# Patient Record
Sex: Female | Born: 1960 | Race: White | Hispanic: No | Marital: Single | Smoking: Former smoker
Health system: Southern US, Community
[De-identification: ages and names within clinical notes are randomized; demographics above are authoritative.]

## PROBLEM LIST (undated history)

## (undated) ENCOUNTER — Emergency Department: Admission: EM | Payer: Medicare HMO | Source: Home / Self Care

## (undated) DIAGNOSIS — M1990 Unspecified osteoarthritis, unspecified site: Secondary | ICD-10-CM

## (undated) DIAGNOSIS — M7072 Other bursitis of hip, left hip: Secondary | ICD-10-CM

## (undated) DIAGNOSIS — F4310 Post-traumatic stress disorder, unspecified: Secondary | ICD-10-CM

## (undated) DIAGNOSIS — E785 Hyperlipidemia, unspecified: Secondary | ICD-10-CM

## (undated) DIAGNOSIS — B009 Herpesviral infection, unspecified: Secondary | ICD-10-CM

## (undated) DIAGNOSIS — I959 Hypotension, unspecified: Secondary | ICD-10-CM

## (undated) DIAGNOSIS — Z9289 Personal history of other medical treatment: Secondary | ICD-10-CM

## (undated) DIAGNOSIS — I219 Acute myocardial infarction, unspecified: Secondary | ICD-10-CM

## (undated) DIAGNOSIS — J45909 Unspecified asthma, uncomplicated: Secondary | ICD-10-CM

## (undated) DIAGNOSIS — I2510 Atherosclerotic heart disease of native coronary artery without angina pectoris: Secondary | ICD-10-CM

## (undated) DIAGNOSIS — J189 Pneumonia, unspecified organism: Secondary | ICD-10-CM

## (undated) DIAGNOSIS — Z95810 Presence of automatic (implantable) cardiac defibrillator: Secondary | ICD-10-CM

## (undated) DIAGNOSIS — K219 Gastro-esophageal reflux disease without esophagitis: Secondary | ICD-10-CM

## (undated) DIAGNOSIS — M797 Fibromyalgia: Secondary | ICD-10-CM

## (undated) DIAGNOSIS — R519 Headache, unspecified: Secondary | ICD-10-CM

## (undated) DIAGNOSIS — Z95818 Presence of other cardiac implants and grafts: Secondary | ICD-10-CM

## (undated) DIAGNOSIS — F329 Major depressive disorder, single episode, unspecified: Secondary | ICD-10-CM

## (undated) DIAGNOSIS — F32A Depression, unspecified: Secondary | ICD-10-CM

## (undated) DIAGNOSIS — I255 Ischemic cardiomyopathy: Secondary | ICD-10-CM

## (undated) DIAGNOSIS — I82409 Acute embolism and thrombosis of unspecified deep veins of unspecified lower extremity: Secondary | ICD-10-CM

## (undated) DIAGNOSIS — D6851 Activated protein C resistance: Secondary | ICD-10-CM

## (undated) DIAGNOSIS — O2230 Deep phlebothrombosis in pregnancy, unspecified trimester: Secondary | ICD-10-CM

## (undated) DIAGNOSIS — I639 Cerebral infarction, unspecified: Secondary | ICD-10-CM

## (undated) DIAGNOSIS — J449 Chronic obstructive pulmonary disease, unspecified: Secondary | ICD-10-CM

## (undated) DIAGNOSIS — H409 Unspecified glaucoma: Secondary | ICD-10-CM

## (undated) DIAGNOSIS — M069 Rheumatoid arthritis, unspecified: Secondary | ICD-10-CM

## (undated) HISTORY — PX: MASTECTOMY: SHX3

## (undated) HISTORY — DX: Other bursitis of hip, left hip: M70.72

## (undated) HISTORY — DX: Activated protein C resistance: D68.51

## (undated) HISTORY — PX: FOOT SURGERY: SHX648

## (undated) HISTORY — PX: COLONOSCOPY: SHX174

## (undated) HISTORY — DX: Unspecified glaucoma: H40.9

## (undated) HISTORY — PX: APPENDECTOMY: SHX54

## (undated) HISTORY — PX: SHOULDER SURGERY: SHX246

## (undated) HISTORY — PX: CARDIAC CATHETERIZATION: SHX172

## (undated) HISTORY — PX: VEIN BYPASS SURGERY: SHX833

## (undated) HISTORY — PX: ABDOMINAL HYSTERECTOMY: SHX81

---

## 2011-03-21 DIAGNOSIS — Z8673 Personal history of transient ischemic attack (TIA), and cerebral infarction without residual deficits: Secondary | ICD-10-CM | POA: Insufficient documentation

## 2011-03-21 DIAGNOSIS — E7800 Pure hypercholesterolemia, unspecified: Secondary | ICD-10-CM | POA: Insufficient documentation

## 2011-03-21 DIAGNOSIS — M797 Fibromyalgia: Secondary | ICD-10-CM | POA: Insufficient documentation

## 2011-03-21 DIAGNOSIS — J439 Emphysema, unspecified: Secondary | ICD-10-CM | POA: Insufficient documentation

## 2011-03-21 DIAGNOSIS — D6851 Activated protein C resistance: Secondary | ICD-10-CM | POA: Insufficient documentation

## 2012-02-02 DIAGNOSIS — Z86718 Personal history of other venous thrombosis and embolism: Secondary | ICD-10-CM | POA: Insufficient documentation

## 2012-04-05 DIAGNOSIS — M1990 Unspecified osteoarthritis, unspecified site: Secondary | ICD-10-CM | POA: Insufficient documentation

## 2013-08-29 DIAGNOSIS — F331 Major depressive disorder, recurrent, moderate: Secondary | ICD-10-CM | POA: Insufficient documentation

## 2013-10-13 DIAGNOSIS — G5620 Lesion of ulnar nerve, unspecified upper limb: Secondary | ICD-10-CM | POA: Insufficient documentation

## 2014-01-14 DIAGNOSIS — M4317 Spondylolisthesis, lumbosacral region: Secondary | ICD-10-CM | POA: Insufficient documentation

## 2014-02-12 DIAGNOSIS — S52601A Unspecified fracture of lower end of right ulna, initial encounter for closed fracture: Secondary | ICD-10-CM | POA: Insufficient documentation

## 2014-03-09 DIAGNOSIS — Z880 Allergy status to penicillin: Secondary | ICD-10-CM | POA: Insufficient documentation

## 2014-03-09 DIAGNOSIS — Z886 Allergy status to analgesic agent status: Secondary | ICD-10-CM | POA: Insufficient documentation

## 2016-08-25 DIAGNOSIS — I2510 Atherosclerotic heart disease of native coronary artery without angina pectoris: Secondary | ICD-10-CM | POA: Insufficient documentation

## 2016-08-31 DIAGNOSIS — K439 Ventral hernia without obstruction or gangrene: Secondary | ICD-10-CM | POA: Insufficient documentation

## 2017-02-08 DIAGNOSIS — M205X2 Other deformities of toe(s) (acquired), left foot: Secondary | ICD-10-CM | POA: Insufficient documentation

## 2017-03-05 DIAGNOSIS — H8112 Benign paroxysmal vertigo, left ear: Secondary | ICD-10-CM | POA: Insufficient documentation

## 2017-06-06 DIAGNOSIS — I739 Peripheral vascular disease, unspecified: Secondary | ICD-10-CM | POA: Insufficient documentation

## 2017-06-13 ENCOUNTER — Other Ambulatory Visit: Payer: Self-pay | Admitting: "Endocrinology

## 2017-06-13 DIAGNOSIS — I739 Peripheral vascular disease, unspecified: Secondary | ICD-10-CM

## 2017-06-13 DIAGNOSIS — R600 Localized edema: Secondary | ICD-10-CM

## 2017-06-19 ENCOUNTER — Ambulatory Visit
Admission: RE | Admit: 2017-06-19 | Discharge: 2017-06-19 | Disposition: A | Discharge status: Home / Self Care | Payer: Self-pay | Source: Ambulatory Visit | Attending: "Endocrinology | Admitting: "Endocrinology

## 2017-06-19 DIAGNOSIS — R600 Localized edema: Secondary | ICD-10-CM

## 2017-06-20 ENCOUNTER — Ambulatory Visit
Admission: RE | Admit: 2017-06-20 | Discharge: 2017-06-20 | Disposition: A | Discharge status: Home / Self Care | Payer: Medicare HMO | Source: Ambulatory Visit | Attending: "Endocrinology | Admitting: "Endocrinology

## 2017-06-20 DIAGNOSIS — I739 Peripheral vascular disease, unspecified: Secondary | ICD-10-CM

## 2017-06-28 ENCOUNTER — Other Ambulatory Visit: Payer: Self-pay | Admitting: Pain Medicine

## 2017-06-28 DIAGNOSIS — M8580 Other specified disorders of bone density and structure, unspecified site: Secondary | ICD-10-CM

## 2017-08-02 DIAGNOSIS — I999 Unspecified disorder of circulatory system: Secondary | ICD-10-CM | POA: Insufficient documentation

## 2017-08-02 DIAGNOSIS — I252 Old myocardial infarction: Secondary | ICD-10-CM | POA: Insufficient documentation

## 2017-08-02 DIAGNOSIS — M7062 Trochanteric bursitis, left hip: Secondary | ICD-10-CM | POA: Insufficient documentation

## 2017-08-02 DIAGNOSIS — E785 Hyperlipidemia, unspecified: Secondary | ICD-10-CM | POA: Insufficient documentation

## 2017-08-14 DIAGNOSIS — I82409 Acute embolism and thrombosis of unspecified deep veins of unspecified lower extremity: Secondary | ICD-10-CM | POA: Insufficient documentation

## 2017-08-21 ENCOUNTER — Ambulatory Visit
Admission: RE | Admit: 2017-08-21 | Discharge: 2017-08-21 | Disposition: A | Discharge status: Home / Self Care | Payer: Medicare HMO | Source: Ambulatory Visit | Attending: Pain Medicine | Admitting: Pain Medicine

## 2017-08-21 DIAGNOSIS — M8580 Other specified disorders of bone density and structure, unspecified site: Secondary | ICD-10-CM

## 2017-09-11 ENCOUNTER — Other Ambulatory Visit (HOSPITAL_COMMUNITY): Payer: Self-pay | Admitting: Physician Assistant

## 2017-09-11 ENCOUNTER — Ambulatory Visit (HOSPITAL_COMMUNITY)
Admission: RE | Admit: 2017-09-11 | Discharge: 2017-09-11 | Disposition: A | Discharge status: Home / Self Care | Payer: Medicare HMO | Source: Ambulatory Visit | Attending: Physician Assistant | Admitting: Physician Assistant

## 2017-09-11 DIAGNOSIS — M79662 Pain in left lower leg: Secondary | ICD-10-CM | POA: Diagnosis present

## 2017-09-11 DIAGNOSIS — M7989 Other specified soft tissue disorders: Secondary | ICD-10-CM | POA: Insufficient documentation

## 2017-09-11 DIAGNOSIS — R936 Abnormal findings on diagnostic imaging of limbs: Secondary | ICD-10-CM | POA: Insufficient documentation

## 2018-02-27 DIAGNOSIS — I639 Cerebral infarction, unspecified: Secondary | ICD-10-CM

## 2018-02-27 HISTORY — DX: Cerebral infarction, unspecified: I63.9

## 2018-03-13 DIAGNOSIS — Z955 Presence of coronary angioplasty implant and graft: Secondary | ICD-10-CM | POA: Insufficient documentation

## 2018-03-13 DIAGNOSIS — I2510 Atherosclerotic heart disease of native coronary artery without angina pectoris: Secondary | ICD-10-CM | POA: Insufficient documentation

## 2018-03-13 DIAGNOSIS — I255 Ischemic cardiomyopathy: Secondary | ICD-10-CM | POA: Insufficient documentation

## 2018-04-29 ENCOUNTER — Encounter (HOSPITAL_COMMUNITY): Payer: Self-pay | Admitting: Family Medicine

## 2018-04-29 ENCOUNTER — Emergency Department (HOSPITAL_COMMUNITY)
Admission: EM | Admit: 2018-04-29 | Discharge: 2018-04-30 | Disposition: A | Discharge status: Home / Self Care | Payer: Medicare HMO | Attending: Emergency Medicine | Admitting: Emergency Medicine

## 2018-04-29 ENCOUNTER — Emergency Department (HOSPITAL_COMMUNITY): Payer: Medicare HMO

## 2018-04-29 ENCOUNTER — Other Ambulatory Visit: Payer: Self-pay

## 2018-04-29 DIAGNOSIS — Z7901 Long term (current) use of anticoagulants: Secondary | ICD-10-CM | POA: Insufficient documentation

## 2018-04-29 DIAGNOSIS — R072 Precordial pain: Secondary | ICD-10-CM

## 2018-04-29 DIAGNOSIS — Z87891 Personal history of nicotine dependence: Secondary | ICD-10-CM | POA: Insufficient documentation

## 2018-04-29 DIAGNOSIS — J449 Chronic obstructive pulmonary disease, unspecified: Secondary | ICD-10-CM | POA: Diagnosis not present

## 2018-04-29 DIAGNOSIS — Z7982 Long term (current) use of aspirin: Secondary | ICD-10-CM | POA: Insufficient documentation

## 2018-04-29 DIAGNOSIS — Z79899 Other long term (current) drug therapy: Secondary | ICD-10-CM | POA: Diagnosis not present

## 2018-04-29 DIAGNOSIS — R079 Chest pain, unspecified: Secondary | ICD-10-CM | POA: Diagnosis present

## 2018-04-29 HISTORY — DX: Acute myocardial infarction, unspecified: I21.9

## 2018-04-29 HISTORY — DX: Major depressive disorder, single episode, unspecified: F32.9

## 2018-04-29 HISTORY — DX: Gastro-esophageal reflux disease without esophagitis: K21.9

## 2018-04-29 HISTORY — DX: Hyperlipidemia, unspecified: E78.5

## 2018-04-29 HISTORY — DX: Cerebral infarction, unspecified: I63.9

## 2018-04-29 HISTORY — DX: Deep phlebothrombosis in pregnancy, unspecified trimester: O22.30

## 2018-04-29 HISTORY — DX: Unspecified asthma, uncomplicated: J45.909

## 2018-04-29 HISTORY — DX: Chronic obstructive pulmonary disease, unspecified: J44.9

## 2018-04-29 HISTORY — DX: Herpesviral infection, unspecified: B00.9

## 2018-04-29 HISTORY — DX: Depression, unspecified: F32.A

## 2018-04-29 LAB — BASIC METABOLIC PANEL
Anion gap: 8 (ref 5–15)
BUN: 10 mg/dL (ref 6–20)
CO2: 25 mmol/L (ref 22–32)
Calcium: 8.7 mg/dL — ABNORMAL LOW (ref 8.9–10.3)
Chloride: 106 mmol/L (ref 98–111)
Creatinine, Ser: 0.84 mg/dL (ref 0.44–1.00)
GFR calc Af Amer: >60 mL/min (ref >60)
GFR calc non Af Amer: >60 mL/min (ref >60)
Glucose, Bld: 92 mg/dL (ref 70–99)
Potassium: 3.8 mmol/L (ref 3.5–5.1)
Sodium: 139 mmol/L (ref 135–145)

## 2018-04-29 LAB — CBC
HCT: 46.8 % — ABNORMAL HIGH (ref 36.0–46.0)
Hemoglobin: 14.7 g/dL (ref 12.0–15.0)
MCH: 31 pg (ref 26.0–34.0)
MCHC: 31.4 g/dL (ref 30.0–36.0)
MCV: 98.7 fL (ref 80.0–100.0)
Platelets: 276 10*3/uL (ref 150–400)
RBC: 4.74 MIL/uL (ref 3.87–5.11)
RDW: 14.2 % (ref 11.5–15.5)
WBC: 7.3 10*3/uL (ref 4.0–10.5)
nRBC: 0 % (ref 0.0–0.2)

## 2018-04-29 LAB — TROPONIN I
Troponin I: <0.03 ng/mL (ref <0.03)
Troponin I: <0.03 ng/mL (ref <0.03)

## 2018-04-29 LAB — I-STAT BETA HCG BLOOD, ED (NOT ORDERABLE): I-stat hCG, quantitative: <5 m[IU]/mL (ref <5)

## 2018-04-29 MED ORDER — SODIUM CHLORIDE 0.9% FLUSH: 3.0000 mL | Freq: Once | INTRAVENOUS | Status: DC

## 2018-04-29 MED ORDER — ASPIRIN 81 MG PO CHEW
324.0000 mg | CHEWABLE_TABLET | Freq: Once | ORAL | Status: AC
  Administered 2018-04-29: 324 mg via ORAL
  Filled 2018-04-29: qty 4

## 2018-04-29 NOTE — ED Triage Notes (Signed)
Patient is experiencing left sided chest pain that started about an hour ago. Patient states the pain started while she was walking. Patient describes pain as pressure with associated symptoms of shortness of breath and dizziness. Patients skin is warm and dry. Appears in no acute distress.

## 2018-04-29 NOTE — Discharge Instructions (Addendum)
Return if pain recurs.   Continue taking aspirin every day.

## 2018-04-29 NOTE — ED Provider Notes (Signed)
[facility]-EMERGENCY DEPT Provider Note   CSN: [id number] Arrival date & time: 04/29/18  2115    History   Chief Complaint Chief Complaint  Patient presents with   Chest Pain    HPI Lindsay Bradford is a 58 y.o. female.     The history is provided by the patient and medical records. No language interpreter was used.  Chest Pain   Lindsay Bradford is a 58 y.o. female who presents to the Emergency Department complaining of CP. She presents to the emergency department complaining of chest pain that began about one hour prior to arrival. She describes it as a central pressure sensation that occurs with activity resolved at rest. She denies any diaphoresis, shortness of breath, abdominal pain, nausea, vomiting, fevers. She does have a cough and it is chronic in nature and at her baseline. He does have an extensive cardiac history with ischemic cardiomyopathy, cardiac stent placement, DVT on anticoagulation as well as fem pop bypass. Her son works for [employer] and began feeling poorly yesterday with cough and diarrhea. Past Medical History:  Diagnosis Date   Asthma    COPD (chronic obstructive pulmonary disease) (HCC)    Depression    DVT (deep vein thrombosis) in pregnancy    GERD (gastroesophageal reflux disease)    Herpes simplex type 2 infection    Hyperlipidemia    MI (myocardial infarction) (HCC)    Stroke (HCC)     There are no active problems to display for this patient.   Past Surgical History:  Procedure Laterality Date   ABDOMINAL HYSTERECTOMY     APPENDECTOMY     CARDIAC CATHETERIZATION     FOOT SURGERY Bilateral    SHOULDER SURGERY Right    VEIN BYPASS SURGERY       OB History   No obstetric history on file.      Home Medications    Prior to Admission medications   Medication Sig Start Date End Date Taking? Authorizing Provider  aspirin 81 MG EC tablet Take 81 mg by mouth daily.   Yes [provider]  carvedilol  (COREG) 6.25 MG tablet Take 6.25 mg by mouth 2 (two) times daily. 03/19/18  Yes [provider]  Omega-3 Fatty Acids (FISH OIL OMEGA-3 PO) Take 1 capsule by mouth daily.   Yes [provider]  pantoprazole (PROTONIX) 40 MG tablet Take 40 mg by mouth daily. 03/24/18  Yes [provider]  valACYclovir (VALTREX) 500 MG tablet Take 1,000 mg by mouth 2 (two) times daily.   Yes [provider]  venlafaxine XR (EFFEXOR-XR) 150 MG 24 hr capsule Take 150 mg by mouth daily. 04/19/18  Yes [provider]  VITAMIN E PO Take 1 capsule by mouth daily.   Yes [provider]  XARELTO 20 MG TABS tablet Take 20 mg by mouth daily. 02/28/18  Yes [provider]    Family History History reviewed. No pertinent family history.  Social History Social History   Tobacco Use   Smoking status: Former Smoker   Smokeless tobacco: Never Used  Substance Use Topics   Alcohol use: Not Currently   Drug use: Not Currently     Allergies   Cephalexin; Clarithromycin; Ibuprofen; Other; Oxymorphone; Penicillins; Potassium chloride; Sulfa antibiotics; Clindamycin; Fentanyl; Naproxen; Iodinated diagnostic agents; and Tetracycline   Review of Systems Review of Systems  Cardiovascular: Positive for chest pain.  All other systems reviewed and are negative.    Physical Exam Updated Vital Signs BP  124/83 (BP Location: Right Arm)    Pulse 83    Temp 98.2 F (36.8 C) (Oral)    Resp 16    Ht  (1.702 m)    Wt 105.2 kg    SpO2 95%    BMI 36.34 kg/m   Physical Exam Vitals signs and nursing note reviewed.  Constitutional:      Appearance: She is well-developed.  HENT:     Head: Normocephalic and atraumatic.  Cardiovascular:     Rate and Rhythm: Normal rate and regular rhythm.  Pulmonary:     Effort: Pulmonary effort is normal. No respiratory distress.  Abdominal:     Palpations: Abdomen is soft.     Tenderness: There is no abdominal tenderness. There  is no guarding or rebound.  Musculoskeletal:     Comments: Nonpitting edema to BLE, left greater than right (pt reports this as chronic).  Skin:    General: Skin is warm and dry.     Capillary Refill: Capillary refill takes less than 2 seconds.  Neurological:     Mental Status: She is alert and oriented to person, place, and time.  Psychiatric:        Behavior: Behavior normal.      ED Treatments / Results  Labs (all labs ordered are listed, but only abnormal results are displayed) Labs Reviewed  BASIC METABOLIC PANEL - Abnormal; Notable for the following components:      Result Value   Calcium 8.7 (*)    All other components within normal limits  CBC - Abnormal; Notable for the following components:   HCT 46.8 (*)    All other components within normal limits  TROPONIN I  TROPONIN I  I-STAT BETA HCG BLOOD, ED ([facility], [facility], [facility] ONLY)  I-STAT BETA HCG BLOOD, ED (NOT ORDERABLE)    EKG EKG Interpretation  Date/Time:  Monday April 29 2018 21:23:34 EDT Ventricular Rate:  83 PR Interval:    QRS Duration: 97 QT Interval:  375 QTC Calculation: 441 R Axis:   -28 Text Interpretation:  Sinus rhythm Borderline left axis deviation Anterior infarct, old no prior available for comparison Confirmed by Tilden Fossa [phone]) on 04/29/2018 9:28:45 PM   Radiology Dg Chest 2 View  Result Date: 04/29/2018 CLINICAL DATA:  Chest pain on the left side EXAM: CHEST - 2 VIEW COMPARISON:  None. FINDINGS: The lungs are hyperinflated likely secondary to COPD. There is mild left basilar airspace disease likely reflecting atelectasis. There is no focal consolidation. There is no pleural effusion or pneumothorax. The heart and mediastinal contours are unremarkable. The osseous structures are unremarkable. IMPRESSION: No active cardiopulmonary disease. Electronically Signed   By: Elige Ko   On: 04/29/2018 21:43    Procedures Procedures (including critical care time)  Medications Ordered in  ED Medications  sodium chloride flush (NS) 0.9 % injection 3 mL (has no administration in time range)  aspirin chewable tablet 324 mg (324 mg Oral Given 04/29/18 2252)     Initial Impression / Assessment and Plan / ED Course  I have reviewed the triage vital signs and the nursing notes.  Pertinent labs & imaging results that were available during my care of the patient were reviewed by me and considered in my medical decision making (see chart for details).        Patient with extensive cardiac history here for evaluation of two episodes of chest pain. She is pain free on ED evaluation. EKG with evidence of prior infarction, no  prior EKG available for comparison. Troponin is negative. Current presentation is not consistent with dissection, pneumonia, PE, acute CHF exacerbation. Patient did have a stress test about one month ago that was negative but given her internal type symptoms discussed that she has higher risk for cardiac event. Discussed recommendation for observation the hospital and she declines at this time given the COVID pandemic. She is agreeable to a repeat troponin to look for change.   Lindsay Bradford was evaluated in Emergency Department on 04/29/2018 for the symptoms described in the history of present illness. She was evaluated in the context of the global COVID-19 pandemic, which necessitated consideration that the patient might be at risk for infection with the SARS-CoV-2 virus that causes COVID-19. Institutional protocols and algorithms that pertain to the evaluation of patients at risk for COVID-19 are in a state of rapid change based on information released by regulatory bodies including the CDC and federal and state organizations. These policies and algorithms were followed during the patient's care in the ED.   Final Clinical Impressions(s) / ED Diagnoses   Final diagnoses:  None    ED Discharge Orders    None       Tilden Fossa, MD 04/29/18 2330

## 2018-04-30 DIAGNOSIS — R072 Precordial pain: Secondary | ICD-10-CM | POA: Diagnosis not present

## 2018-04-30 LAB — TROPONIN I: Troponin I: <0.03 ng/mL (ref <0.03)

## 2018-04-30 NOTE — ED Provider Notes (Signed)
Care assumed from Dr. Madilyn Hook, patient with episode of chest pain which had resolved, negative initial work-up.  Signed out to me awaiting repeat troponin.  Repeat troponin is not detectable.  She has had no further chest discomfort while in the ED.  She is felt to be safe for discharge.  She is referred to her cardiologist for follow-up.   Dione Booze, MD 04/30/18 [phone]

## 2018-06-18 DIAGNOSIS — I472 Ventricular tachycardia: Secondary | ICD-10-CM | POA: Insufficient documentation

## 2018-06-18 DIAGNOSIS — I4729 Other ventricular tachycardia: Secondary | ICD-10-CM | POA: Insufficient documentation

## 2018-06-20 HISTORY — PX: LOOP RECORDER IMPLANT: SHX5954

## 2018-07-11 ENCOUNTER — Other Ambulatory Visit: Payer: Self-pay

## 2018-07-12 ENCOUNTER — Ambulatory Visit: Payer: Medicare HMO | Admitting: Family

## 2018-07-17 ENCOUNTER — Ambulatory Visit: Payer: Medicare HMO | Admitting: Family

## 2018-07-22 ENCOUNTER — Ambulatory Visit: Payer: Medicare HMO | Admitting: Family

## 2018-08-07 ENCOUNTER — Ambulatory Visit: Payer: Medicare HMO

## 2018-08-20 DIAGNOSIS — Z95818 Presence of other cardiac implants and grafts: Secondary | ICD-10-CM | POA: Diagnosis not present

## 2018-08-20 DIAGNOSIS — R55 Syncope and collapse: Secondary | ICD-10-CM | POA: Diagnosis not present

## 2018-08-20 DIAGNOSIS — Z4509 Encounter for adjustment and management of other cardiac device: Secondary | ICD-10-CM | POA: Diagnosis not present

## 2018-08-23 ENCOUNTER — Telehealth: Payer: Self-pay | Admitting: General Practice

## 2018-08-23 ENCOUNTER — Other Ambulatory Visit: Payer: Self-pay

## 2018-08-23 NOTE — Telephone Encounter (Signed)
Patient has COPD and asthma diagnosis on problem list.  She is also a smoker.  She does not have a regular cough but a bit at times which has not changed.

## 2018-08-26 ENCOUNTER — Encounter: Payer: Self-pay | Admitting: Family

## 2018-08-26 ENCOUNTER — Other Ambulatory Visit: Payer: Self-pay | Admitting: Family

## 2018-08-26 ENCOUNTER — Other Ambulatory Visit: Payer: Self-pay

## 2018-08-26 ENCOUNTER — Ambulatory Visit (INDEPENDENT_AMBULATORY_CARE_PROVIDER_SITE_OTHER): Payer: Medicare HMO | Admitting: Family

## 2018-08-26 VITALS — BP 108/77 | HR 86 | Temp 97.1°F | Ht 67.0 in | Wt 225.0 lb

## 2018-08-26 DIAGNOSIS — M1990 Unspecified osteoarthritis, unspecified site: Secondary | ICD-10-CM | POA: Diagnosis not present

## 2018-08-26 DIAGNOSIS — I739 Peripheral vascular disease, unspecified: Secondary | ICD-10-CM

## 2018-08-26 DIAGNOSIS — I2510 Atherosclerotic heart disease of native coronary artery without angina pectoris: Secondary | ICD-10-CM

## 2018-08-26 DIAGNOSIS — F331 Major depressive disorder, recurrent, moderate: Secondary | ICD-10-CM

## 2018-08-26 DIAGNOSIS — E785 Hyperlipidemia, unspecified: Secondary | ICD-10-CM

## 2018-08-26 DIAGNOSIS — E669 Obesity, unspecified: Secondary | ICD-10-CM | POA: Insufficient documentation

## 2018-08-26 DIAGNOSIS — Z955 Presence of coronary angioplasty implant and graft: Secondary | ICD-10-CM | POA: Diagnosis not present

## 2018-08-26 DIAGNOSIS — J42 Unspecified chronic bronchitis: Secondary | ICD-10-CM

## 2018-08-26 DIAGNOSIS — J439 Emphysema, unspecified: Secondary | ICD-10-CM

## 2018-08-26 DIAGNOSIS — D6851 Activated protein C resistance: Secondary | ICD-10-CM

## 2018-08-26 DIAGNOSIS — K219 Gastro-esophageal reflux disease without esophagitis: Secondary | ICD-10-CM

## 2018-08-26 DIAGNOSIS — Z86718 Personal history of other venous thrombosis and embolism: Secondary | ICD-10-CM | POA: Diagnosis not present

## 2018-08-26 DIAGNOSIS — Z8673 Personal history of transient ischemic attack (TIA), and cerebral infarction without residual deficits: Secondary | ICD-10-CM

## 2018-08-26 DIAGNOSIS — I255 Ischemic cardiomyopathy: Secondary | ICD-10-CM | POA: Diagnosis not present

## 2018-08-26 DIAGNOSIS — I472 Ventricular tachycardia: Secondary | ICD-10-CM | POA: Diagnosis not present

## 2018-08-26 DIAGNOSIS — Z20822 Contact with and (suspected) exposure to covid-19: Secondary | ICD-10-CM

## 2018-08-26 DIAGNOSIS — I4729 Other ventricular tachycardia: Secondary | ICD-10-CM

## 2018-08-26 MED ORDER — ROSUVASTATIN CALCIUM 40 MG PO TABS: 40.0000 mg | ORAL_TABLET | Freq: Every day | ORAL | 3 refills | Status: DC

## 2018-08-26 MED ORDER — PANTOPRAZOLE SODIUM 40 MG PO TBEC: 40.0000 mg | DELAYED_RELEASE_TABLET | Freq: Every day | ORAL | 1 refills | Status: DC

## 2018-08-26 MED ORDER — XARELTO 20 MG PO TABS: 20.0000 mg | ORAL_TABLET | Freq: Every day | ORAL | 1 refills | Status: DC

## 2018-08-26 MED ORDER — SPIRIVA HANDIHALER 18 MCG IN CAPS: 18.0000 ug | ORAL_CAPSULE | Freq: Every day | RESPIRATORY_TRACT | 12 refills | Status: DC

## 2018-08-26 MED ORDER — TRIAMCINOLONE ACETONIDE 0.1 % EX CREA: 1.0000 "application " | TOPICAL_CREAM | Freq: Two times a day (BID) | CUTANEOUS | 0 refills | Status: DC

## 2018-08-26 MED ORDER — VENLAFAXINE HCL ER 150 MG PO CP24: 150.0000 mg | ORAL_CAPSULE | Freq: Every day | ORAL | 1 refills | Status: DC

## 2018-08-26 MED ORDER — ALBUTEROL SULFATE HFA 108 (90 BASE) MCG/ACT IN AERS: 2.0000 | INHALATION_SPRAY | Freq: Four times a day (QID) | RESPIRATORY_TRACT | 0 refills | Status: DC | PRN

## 2018-08-26 NOTE — Progress Notes (Signed)
Subjective:    Patient ID: Lindsay Bradford, female    DOB: [birth year], 58 y.o.   MRN: [id number]  Chief Complaint  Patient presents with  . New Patient (Initial Visit)   Pt presents to the office today to establish care after her PCP retired. Pt is followed by Cardiologists every 3 months for hx MI in 2000 , Hx CVA, PVD, isand NSVT. She does take xarelto daily because she has hx of DVT with Leiden.   She is currently seeing Ortho every 3 months for hip injections.  Hypertension This is a chronic problem. The current episode started more than 1 year ago. The problem has been resolved since onset. The problem is controlled. Associated symptoms include peripheral edema (mild left leg) and shortness of breath (COPD). Pertinent negatives include no malaise/fatigue. Risk factors for coronary artery disease include dyslipidemia, obesity and sedentary lifestyle. The current treatment provides moderate improvement. Hypertensive end-organ damage includes CAD/MI and CVA.  Hyperlipidemia This is a chronic problem. The current episode started more than 1 year ago. The problem is controlled. Recent lipid tests were reviewed and are normal. Exacerbating diseases include obesity. Associated symptoms include shortness of breath (COPD). Current antihyperlipidemic treatment includes statins. The current treatment provides moderate improvement of lipids. Risk factors for coronary artery disease include dyslipidemia, hypertension, a sedentary lifestyle and post-menopausal.  Arthritis Presents for follow-up visit. She complains of pain and stiffness. The symptoms have been stable. Affected location: back. Her pain is at a severity of 1/10.  Depression        This is a chronic problem.  The current episode started more than 1 year ago.   The onset quality is gradual.   The problem occurs intermittently.  The problem has been waxing and waning since onset.  Associated symptoms include irritable, restlessness, decreased  interest and sad.  Associated symptoms include no helplessness and no hopelessness.  Past treatments include SNRIs - Serotonin and norepinephrine reuptake inhibitors. COPD She is currently smoking 1/2 pack daily. She is using Spiriva daily. Has SOB at times.     Review of Systems  Constitutional: Negative for malaise/fatigue.  Respiratory: Positive for shortness of breath (COPD).   Musculoskeletal: Positive for arthritis and stiffness.  Psychiatric/Behavioral: Positive for depression.  All other systems reviewed and are negative.   Family History  Problem Relation Age of Onset  . Arthritis Mother   . COPD Father   . COPD Brother   . Heart disease Sister   . COPD Brother   . Stroke Brother   . Heart disease Brother     Social History   Socioeconomic History  . Marital status: Single    Spouse name: Not on file  . Number of children: Not on file  . Years of education: Not on file  . Highest education level: Not on file  Occupational History  . Not on file  Social Needs  . Financial resource strain: Not on file  . Food insecurity    Worry: Not on file    Inability: Not on file  . Transportation needs    Medical: Not on file    Non-medical: Not on file  Tobacco Use  . Smoking status: Heavy Tobacco Smoker    Types: Cigarettes  . Smokeless tobacco: Never Used  . Tobacco comment: started back this year and smokes 1/2 pack daily  Substance and Sexual Activity  . Alcohol use: Not Currently  . Drug use: Not Currently  . Sexual activity: Not Currently  Lifestyle  . Physical activity    Days per week: Not on file    Minutes per session: Not on file  . Stress: Not on file  Relationships  . Social Herbalist on phone: Not on file    Gets together: Not on file    Attends religious service: Not on file    Active member of club or organization: Not on file    Attends meetings of clubs or organizations: Not on file    Relationship status: Not on file  Other  Topics Concern  . Not on file  Social History Narrative  . Not on file       Objective:   Physical Exam Vitals signs reviewed.  Constitutional:      General: She is irritable. She is not in acute distress.    Appearance: She is well-developed.  HENT:     Head: Normocephalic and atraumatic.     Right Ear: Tympanic membrane normal.     Left Ear: Tympanic membrane normal.  Eyes:     Pupils: Pupils are equal, round, and reactive to light.  Neck:     Musculoskeletal: Normal range of motion and neck supple.     Thyroid: No thyromegaly.  Cardiovascular:     Rate and Rhythm: Normal rate and regular rhythm.     Heart sounds: Normal heart sounds. No murmur.  Pulmonary:     Effort: Pulmonary effort is normal. No respiratory distress.     Breath sounds: Normal breath sounds. No wheezing.  Abdominal:     General: Bowel sounds are normal. There is no distension.     Palpations: Abdomen is soft.     Tenderness: There is no abdominal tenderness.  Musculoskeletal: Normal range of motion.        General: No tenderness.     Left lower leg: Edema (trace) present.  Skin:    General: Skin is warm and dry.     Findings: Lesion (scattered scabbed sores on bilateral arms and legs) present.  Neurological:     Mental Status: She is alert and oriented to person, place, and time.     Cranial Nerves: No cranial nerve deficit.     Deep Tendon Reflexes: Reflexes are normal and symmetric.  Psychiatric:        Behavior: Behavior normal.        Thought Content: Thought content normal.        Judgment: Judgment normal.       BP 108/77   Pulse 86   Temp (!) 97.1 F (36.2 C) (Oral)   Ht 5' 7"  (1.702 m)   Wt 225 lb (102.1 kg)   BMI 35.24 kg/m      Assessment & Plan:  Lindsay Bradford comes in today with chief complaint of New Patient (Initial Visit)   Diagnosis and orders addressed:  1. Peripheral arterial disease (HCC) - rosuvastatin (CRESTOR) 40 MG tablet; Take 1 tablet (40 mg total) by  mouth daily.  Dispense: 90 tablet; Refill: 3 - CMP14+EGFR - CBC with Differential/Platelet - Lipid panel  2. NSVT (nonsustained ventricular tachycardia) (HCC) - CMP14+EGFR - CBC with Differential/Platelet - Lipid panel  3. Ischemic cardiomyopathy - CMP14+EGFR - CBC with Differential/Platelet  4. Pulmonary emphysema, unspecified emphysema type ([place]) - CMP14+EGFR - CBC with Differential/Platelet  5. Arthritis - CMP14+EGFR - CBC with Differential/Platelet  6. Factor V Leiden ([place]) - XARELTO 20 MG TABS tablet; Take 1 tablet (20 mg total) by mouth daily.  Dispense:  90 tablet; Refill: 1 - CMP14+EGFR - CBC with Differential/Platelet  7. S/P coronary artery stent placement  - CMP14+EGFR - CBC with Differential/Platelet  8. Moderate episode of recurrent major depressive disorder (HCC) - venlafaxine XR (EFFEXOR-XR) 150 MG 24 hr capsule; Take 1 capsule (150 mg total) by mouth daily.  Dispense: 90 capsule; Refill: 1 - CMP14+EGFR - CBC with Differential/Platelet  9. History of DVT (deep vein thrombosis)  - XARELTO 20 MG TABS tablet; Take 1 tablet (20 mg total) by mouth daily.  Dispense: 90 tablet; Refill: 1 - CMP14+EGFR - CBC with Differential/Platelet  10. History of CVA (cerebrovascular accident) - rosuvastatin (CRESTOR) 40 MG tablet; Take 1 tablet (40 mg total) by mouth daily.  Dispense: 90 tablet; Refill: 3 - CMP14+EGFR - CBC with Differential/Platelet  11. Dyslipidemia - rosuvastatin (CRESTOR) 40 MG tablet; Take 1 tablet (40 mg total) by mouth daily.  Dispense: 90 tablet; Refill: 3 - CMP14+EGFR - CBC with Differential/Platelet - Lipid panel  12. Coronary artery disease involving native coronary artery of native heart without angina pectoris  - rosuvastatin (CRESTOR) 40 MG tablet; Take 1 tablet (40 mg total) by mouth daily.  Dispense: 90 tablet; Refill: 3 - CMP14+EGFR - CBC with Differential/Platelet - Lipid panel  13. Morbid obesity ([place])  - CMP14+EGFR - CBC  with Differential/Platelet  14. Chronic bronchitis, unspecified chronic bronchitis type (HCC)  - albuterol (VENTOLIN HFA) 108 (90 Base) MCG/ACT inhaler; Inhale 2 puffs into the lungs every 6 (six) hours as needed for wheezing or shortness of breath.  Dispense: 8 g; Refill: 0 - tiotropium (SPIRIVA HANDIHALER) 18 MCG inhalation capsule; Place 1 capsule (18 mcg total) into inhaler and inhale at bedtime.  Dispense: 30 capsule; Refill: 12  15. Gastroesophageal reflux disease, esophagitis presence not specified  - pantoprazole (PROTONIX) 40 MG tablet; Take 1 tablet (40 mg total) by mouth daily.  Dispense: 90 tablet; Refill: 1   Labs pending Health Maintenance reviewed Diet and exercise encouraged  Follow up plan: 6 months and keep all follow up with Cardiologists    Evelina Dun, FNP

## 2018-08-26 NOTE — Patient Instructions (Signed)
Testing Locations (Monday - Friday, 8 a.m. - 3:30 p.m.) . [place]: [facility], [address], [place], [place]  . [place]: [address], [address] [place] ([address])  . [facility]: [address], [place], [place] (across from [facility])    Health Maintenance, Female Adopting a healthy lifestyle and getting preventive care are important in promoting health and wellness. Ask your health care provider about:  The right schedule for you to have regular tests and exams.  Things you can do on your own to prevent diseases and keep yourself healthy. What should I know about diet, weight, and exercise? Eat a healthy diet   Eat a diet that includes plenty of vegetables, fruits, low-fat dairy products, and lean protein.  Do not eat a lot of foods that are high in solid fats, added sugars, or sodium. Maintain a healthy weight Body mass index (BMI) is used to identify weight problems. It estimates body fat based on height and weight. Your health care provider can help determine your BMI and help you achieve or maintain a healthy weight. Get regular exercise Get regular exercise. This is one of the most important things you can do for your health. Most adults should:  Exercise for at least 150 minutes each week. The exercise should increase your heart rate and make you sweat (moderate-intensity exercise).  Do strengthening exercises at least twice a week. This is in addition to the moderate-intensity exercise.  Spend less time sitting. Even light physical activity can be beneficial. Watch cholesterol and blood lipids Have your blood tested for lipids and cholesterol at 56 years of age, then have this test every 5 years. Have your cholesterol levels checked more often if:  Your lipid or cholesterol levels are high.  You are older than 56 years of age.  You are at high  risk for heart disease. What should I know about cancer screening? Depending on your health history and family history, you may need to have cancer screening at various ages. This may include screening for:  Breast cancer.  Cervical cancer.  Colorectal cancer.  Skin cancer.  Lung cancer. What should I know about heart disease, diabetes, and high blood pressure? Blood pressure and heart disease  High blood pressure causes heart disease and increases the risk of stroke. This is more likely to develop in people who have high blood pressure readings, are of African descent, or are overweight.  Have your blood pressure checked: ? Every 3-5 years if you are 3918-56 years of age. ? Every year if you are 56 years old or older. Diabetes Have regular diabetes screenings. This checks your fasting blood sugar level. Have the screening done:  Once every three years after age 56 if you are at a normal weight and have a low risk for diabetes.  More often and at a younger age if you are overweight or have a high risk for diabetes. What should I know about preventing infection? Hepatitis B If you have a higher risk for hepatitis B, you should be screened for this virus. Talk with your health care provider to find out if you are at risk for hepatitis B infection. Hepatitis C Testing is recommended for:  Everyone born from [birth year] through 1963.  Anyone with known risk factors for hepatitis C. Sexually transmitted infections (STIs)  Get screened for STIs, including gonorrhea and chlamydia, if: ? You are sexually active  and are younger than 56 years of age. ? You are older than 56 years of age and your health care provider tells you that you are at risk for this type of infection. ? Your sexual activity has changed since you were last screened, and you are at increased risk for chlamydia or gonorrhea. Ask your health care provider if you are at risk.  Ask your health care provider about whether you  are at high risk for HIV. Your health care provider may recommend a prescription medicine to help prevent HIV infection. If you choose to take medicine to prevent HIV, you should first get tested for HIV. You should then be tested every 3 months for as long as you are taking the medicine. Pregnancy  If you are about to stop having your period (premenopausal) and you may become pregnant, seek counseling before you get pregnant.  Take 400 to 800 micrograms (mcg) of folic acid every day if you become pregnant.  Ask for birth control (contraception) if you want to prevent pregnancy. Osteoporosis and menopause Osteoporosis is a disease in which the bones lose minerals and strength with aging. This can result in bone fractures. If you are 24 years old or older, or if you are at risk for osteoporosis and fractures, ask your health care provider if you should:  Be screened for bone loss.  Take a calcium or vitamin D supplement to lower your risk of fractures.  Be given hormone replacement therapy (HRT) to treat symptoms of menopause. Follow these instructions at home: Lifestyle  Do not use any products that contain nicotine or tobacco, such as cigarettes, e-cigarettes, and chewing tobacco. If you need help quitting, ask your health care provider.  Do not use street drugs.  Do not share needles.  Ask your health care provider for help if you need support or information about quitting drugs. Alcohol use  Do not drink alcohol if: ? Your health care provider tells you not to drink. ? You are pregnant, may be pregnant, or are planning to become pregnant.  If you drink alcohol: ? Limit how much you use to 0-1 drink a day. ? Limit intake if you are breastfeeding.  Be aware of how much alcohol is in your drink. In the [place] one drink equals one 12 oz bottle of beer (355 mL), one 5 oz glass of wine (148 mL), or one 1 oz glass of hard liquor (44 mL). General instructions  Schedule regular  health, dental, and eye exams.  Stay current with your vaccines.  Tell your health care provider if: ? You often feel depressed. ? You have ever been abused or do not feel safe at home. Summary  Adopting a healthy lifestyle and getting preventive care are important in promoting health and wellness.  Follow your health care provider's instructions about healthy diet, exercising, and getting tested or screened for diseases.  Follow your health care provider's instructions on monitoring your cholesterol and blood pressure. This information is not intended to replace advice given to you by your health care provider. Make sure you discuss any questions you have with your health care provider. Document Released: 07/18/2010 Document Revised: 12/26/2017 Document Reviewed: 12/26/2017 Elsevier Patient Education  2020 [employer].

## 2018-08-26 NOTE — Telephone Encounter (Signed)
Pharmacy comment:  Alternative Requested: PRIOR AUTH REQUIRED. CALL [phone].  INSURANCE WILL PAY FOR DULOXETINE, FLUOXETINE, SERTRALINE, AND ESCITALOPRAM

## 2018-08-27 LAB — CBC WITH DIFFERENTIAL/PLATELET
Basophils Absolute: 0.1 10*3/uL (ref 0.0–0.2)
Basos: 2 %
EOS (ABSOLUTE): 0.3 10*3/uL (ref 0.0–0.4)
Eos: 5 %
Hematocrit: 43.4 % (ref 34.0–46.6)
Hemoglobin: 14.6 g/dL (ref 11.1–15.9)
Immature Grans (Abs): 0 10*3/uL (ref 0.0–0.1)
Immature Granulocytes: 0 %
Lymphocytes Absolute: 2 10*3/uL (ref 0.7–3.1)
Lymphs: 36 %
MCH: 31.7 pg (ref 26.6–33.0)
MCHC: 33.6 g/dL (ref 31.5–35.7)
MCV: 94 fL (ref 79–97)
Monocytes Absolute: 0.7 10*3/uL (ref 0.1–0.9)
Monocytes: 12 %
Neutrophils Absolute: 2.5 10*3/uL (ref 1.4–7.0)
Neutrophils: 45 %
Platelets: 325 10*3/uL (ref 150–450)
RBC: 4.61 x10E6/uL (ref 3.77–5.28)
RDW: 13.2 % (ref 11.7–15.4)
WBC: 5.6 10*3/uL (ref 3.4–10.8)

## 2018-08-27 LAB — LIPID PANEL
Chol/HDL Ratio: 3.6 ratio (ref 0.0–4.4)
Cholesterol, Total: 131 mg/dL (ref 100–199)
HDL: 36 mg/dL — ABNORMAL LOW (ref >39)
LDL Calculated: 77 mg/dL (ref 0–99)
Triglycerides: 92 mg/dL (ref 0–149)
VLDL Cholesterol Cal: 18 mg/dL (ref 5–40)

## 2018-08-27 LAB — CMP14+EGFR
ALT: 15 IU/L (ref 0–32)
AST: 28 IU/L (ref 0–40)
Albumin/Globulin Ratio: 1.4 (ref 1.2–2.2)
Albumin: 3.7 g/dL — ABNORMAL LOW (ref 3.8–4.9)
Alkaline Phosphatase: 97 IU/L (ref 39–117)
BUN/Creatinine Ratio: 9 (ref 9–23)
BUN: 7 mg/dL (ref 6–24)
Bilirubin Total: 0.4 mg/dL (ref 0.0–1.2)
CO2: 24 mmol/L (ref 20–29)
Calcium: 9 mg/dL (ref 8.7–10.2)
Chloride: 106 mmol/L (ref 96–106)
Creatinine, Ser: 0.74 mg/dL (ref 0.57–1.00)
GFR calc Af Amer: 105 mL/min/{1.73_m2} (ref >59)
GFR calc non Af Amer: 91 mL/min/{1.73_m2} (ref >59)
Globulin, Total: 2.6 g/dL (ref 1.5–4.5)
Glucose: 105 mg/dL — ABNORMAL HIGH (ref 65–99)
Potassium: 3.8 mmol/L (ref 3.5–5.2)
Sodium: 145 mmol/L — ABNORMAL HIGH (ref 134–144)
Total Protein: 6.3 g/dL (ref 6.0–8.5)

## 2018-08-27 LAB — NOVEL CORONAVIRUS, NAA: SARS-CoV-2, NAA: NOT DETECTED

## 2018-08-28 ENCOUNTER — Telehealth: Payer: Self-pay | Admitting: Family

## 2018-08-28 NOTE — Telephone Encounter (Signed)
Patient says she did not know why she was given this medication. She did not ask for another depression medication.  She is aware provider will return on Friday.

## 2018-08-29 DIAGNOSIS — R55 Syncope and collapse: Secondary | ICD-10-CM | POA: Diagnosis not present

## 2018-08-29 NOTE — Telephone Encounter (Signed)
I got a message from her pharmacy that her insurance would not pay for the Effexor, but would pay for Cymbalta. If she is getting the Effexor she can continue that medication.

## 2018-08-29 NOTE — Telephone Encounter (Signed)
Patient states that she is still getting the Effexor and she will stay on that.

## 2018-09-09 ENCOUNTER — Other Ambulatory Visit: Payer: Self-pay | Admitting: Family

## 2018-09-09 DIAGNOSIS — J42 Unspecified chronic bronchitis: Secondary | ICD-10-CM

## 2018-09-09 NOTE — Telephone Encounter (Signed)
Awaiting medication request from pharmacy.

## 2018-09-12 ENCOUNTER — Ambulatory Visit: Payer: Medicare HMO

## 2018-09-12 MED ORDER — VALACYCLOVIR HCL 500 MG PO TABS: 1000.0000 mg | ORAL_TABLET | Freq: Two times a day (BID) | ORAL | 0 refills | Status: AC

## 2018-09-12 NOTE — Telephone Encounter (Signed)
Patient is requesting refill on valtrex

## 2018-09-25 ENCOUNTER — Ambulatory Visit: Payer: Medicare HMO

## 2018-09-25 DIAGNOSIS — R55 Syncope and collapse: Secondary | ICD-10-CM | POA: Diagnosis not present

## 2018-09-25 DIAGNOSIS — Z4509 Encounter for adjustment and management of other cardiac device: Secondary | ICD-10-CM | POA: Diagnosis not present

## 2018-09-25 DIAGNOSIS — Z95818 Presence of other cardiac implants and grafts: Secondary | ICD-10-CM | POA: Diagnosis not present

## 2018-09-27 ENCOUNTER — Ambulatory Visit (INDEPENDENT_AMBULATORY_CARE_PROVIDER_SITE_OTHER): Payer: Medicare HMO | Admitting: *Deleted

## 2018-09-27 DIAGNOSIS — Z599 Problem related to housing and economic circumstances, unspecified: Secondary | ICD-10-CM

## 2018-09-27 DIAGNOSIS — Z598 Other problems related to housing and economic circumstances: Secondary | ICD-10-CM | POA: Diagnosis not present

## 2018-09-27 DIAGNOSIS — Z0000 Encounter for general adult medical examination without abnormal findings: Secondary | ICD-10-CM | POA: Diagnosis not present

## 2018-09-27 NOTE — Addendum Note (Signed)
Addended by: Thana Ates on: 09/27/2018 04:06 PM   Modules accepted: Orders

## 2018-09-27 NOTE — Progress Notes (Addendum)
MEDICARE ANNUAL WELLNESS VISIT  09/27/2018  Telephone Visit Disclaimer This Medicare AWV was conducted by telephone due to national recommendations for restrictions regarding the COVID-19 Pandemic (e.g. social distancing).  I verified, using two identifiers, that I am speaking with Lindsay Bradford or their authorized healthcare agent. I discussed the limitations, risks, security, and privacy concerns of performing an evaluation and management service by telephone and the potential availability of an in-person appointment in the future. The patient expressed understanding and agreed to proceed.   Subjective:  Lindsay Bradford is a 58 y.o. female patient of Hawks, Edilia Bohristy A, FNP who had a Medicare Annual Wellness Visit today via telephone. Vira BlancoDona is Disabled and lives alone. She has 2 children. she reports that she is socially active and does interact with friends/family regularly. she is not physically active and enjoys spend time with grandchildren.  Patient Care Team: Junie SpencerHawks, Christy A, FNP as PCP - General (Family Medicine)  Advanced Directives 09/27/2018 04/29/2018  Does Patient Have a Medical Advance Directive? Yes No  Type of Estate agentAdvance Directive Healthcare Power of [place];Living will -  Copy of Healthcare Power of Attorney in Chart? No - copy requested -  Would patient like information on creating a medical advance directive? - Yes (ED - Information included in AVS)    Hospital Utilization Over the Past 12 Months: # of hospitalizations or ER visits: 3 # of surgeries: 1  Review of Systems    Patient reports that her overall health is worse compared to last year. Due to dealing with increased pain and having silent heart attacks.     Patient Reported Readings (BP, Pulse, CBG, Weight, etc) none  Pain Assessment       Current Medications & Allergies (verified) Allergies as of 09/27/2018      Reactions   Cephalexin Hives   Clarithromycin Hives   Ibuprofen Hives   Other Itching,  Other (See Comments)   Whole milk and oranges   Oxymorphone Palpitations   Penicillins Anaphylaxis   Potassium Chloride Hives   Reacts to oral KCl, tolerates it slow IV Reacts to oral KCl, tolerates it slow IV Reacts to oral KCl, tolerates it slow IV Reacts to oral KCl, tolerates it slow IV   Sulfa Antibiotics Anaphylaxis   Clindamycin Other (See Comments)   Fentanyl Hives, Other (See Comments)   Fentanyl patch, it breaks pt out in hives and lowers her blood pressure. Fentanyl patch, it breaks pt out in hives and lowers her blood pressure. Fentanyl patch, it breaks pt out in hives and lowers her blood pressure. Fentanyl patch, it breaks pt out in hives and lowers her blood pressure.   Naproxen Hives   Iodinated Diagnostic Agents Hives   Tetracycline Hives, Itching      Medication List       Accurate as of September 27, 2018  2:28 PM. If you have any questions, ask your nurse or doctor.        albuterol 108 (90 Base) MCG/ACT inhaler Commonly known as: VENTOLIN HFA TAKE 2 PUFFS BY MOUTH EVERY 6 HOURS AS NEEDED FOR WHEEZE OR SHORTNESS OF BREATH   aspirin 81 MG EC tablet Take 81 mg by mouth daily.   carvedilol 6.25 MG tablet Commonly known as: COREG Take 6.25 mg by mouth 2 (two) times daily.   DULoxetine 20 MG capsule Commonly known as: CYMBALTA Please specify directions, refills and quantity   FISH OIL OMEGA-3 PO Take 1 capsule by mouth daily.   pantoprazole 40  MG tablet Commonly known as: PROTONIX Take 1 tablet (40 mg total) by mouth daily.   rosuvastatin 40 MG tablet Commonly known as: Crestor Take 1 tablet (40 mg total) by mouth daily.   Spiriva HandiHaler 18 MCG inhalation capsule Generic drug: tiotropium Place 1 capsule (18 mcg total) into inhaler and inhale at bedtime.   triamcinolone cream 0.1 % Commonly known as: KENALOG APPLY TO AFFECTED AREA TWICE A DAY   valACYclovir 500 MG tablet Commonly known as: VALTREX Take 2 tablets (1,000 mg total) by  mouth 2 (two) times daily.   VITAMIN E PO Take 1 capsule by mouth daily.   Xarelto 20 MG Tabs tablet Generic drug: rivaroxaban Take 1 tablet (20 mg total) by mouth daily.       History (reviewed): Past Medical History:  Diagnosis Date  . Asthma   . COPD (chronic obstructive pulmonary disease) (HCC)   . Depression   . DVT (deep vein thrombosis) in pregnancy   . GERD (gastroesophageal reflux disease)   . Herpes simplex type 2 infection   . Hyperlipidemia   . MI (myocardial infarction) (HCC)   . Stroke [facility](HCC)    Past Surgical History:  Procedure Laterality Date  . ABDOMINAL HYSTERECTOMY    . APPENDECTOMY    . CARDIAC CATHETERIZATION    . FOOT SURGERY Bilateral   . SHOULDER SURGERY Right   . VEIN BYPASS SURGERY     Family History  Problem Relation Age of Onset  . Arthritis Mother   . COPD Father   . COPD Brother   . Heart disease Sister   . COPD Brother   . Stroke Brother   . Heart disease Brother    Social History   Socioeconomic History  . Marital status: Single    Spouse name: Not on file  . Number of children: 2  . Years of education: Not on file  . Highest education level: Some college, no degree  Occupational History  . Occupation: Disable   Social Needs  . Financial resource strain: Somewhat hard  . Food insecurity    Worry: Sometimes true    Inability: Sometimes true  . Transportation needs    Medical: No    Non-medical: No  Tobacco Use  . Smoking status: Heavy Tobacco Smoker    Types: Cigarettes  . Smokeless tobacco: Never Used  . Tobacco comment: started back this year and smokes 1/2 pack daily  Substance and Sexual Activity  . Alcohol use: Not Currently  . Drug use: Not Currently  . Sexual activity: Not Currently  Lifestyle  . Physical activity    Days per week: 0 days    Minutes per session: 0 min  . Stress: Only a little  Relationships  . Social connections    Talks on phone: More than three times a week    Gets together: More  than three times a week    Attends religious service: Never    Active member of club or organization: No    Attends meetings of clubs or organizations: Never    Relationship status: Divorced  Other Topics Concern  . Not on file  Social History Narrative  . Not on file    Activities of Daily Living In your present state of health, do you have any difficulty performing the following activities: 09/27/2018  Hearing? Y  Comment Has had hearing tested and has a slight loss  Vision? N  Comment wears glasses  Difficulty concentrating or making decisions? N  Walking or climbing stairs? Y  Comment due to bursitis on left hip  Dressing or bathing? N  Doing errands, shopping? N  Preparing Food and eating ? N  Using the Toilet? N  In the past six months, have you accidently leaked urine? N  Do you have problems with loss of bowel control? N  Managing your Medications? N  Managing your Finances? N  Housekeeping or managing your Housekeeping? N  Some recent data might be hidden    Patient Education/ Literacy    Exercise Current Exercise Habits: The patient does not participate in regular exercise at present, Exercise limited by: orthopedic condition(s);cardiac condition(s)  Diet Patient reports consuming 1 meals a day and 1 snack(s) a day Patient reports that her primary diet is: Regular Patient reports that she does have regular access to food.   Depression Screen PHQ 2/9 Scores 09/27/2018 08/26/2018 08/26/2018  PHQ - 2 Score 2 2 1   PHQ- 9 Score 10 9 -     Fall Risk Fall Risk  09/27/2018  Falls in the past year? 1  Number falls in past yr: 1  Injury with Fall? 1  Risk for fall due to : History of fall(s);Impaired balance/gait     Objective:  Mariadelaluz Guggenheim seemed alert and oriented and she participated appropriately during our telephone visit.  Blood Pressure Weight BMI  BP Readings from Last 3 Encounters:  08/26/18 108/77  04/30/18 (!) 129/95   Wt Readings from Last 3  Encounters:  08/26/18 225 lb (102.1 kg)  04/29/18 232 lb (105.2 kg)   BMI Readings from Last 1 Encounters:  08/26/18 35.24 kg/m    *Unable to obtain current vital signs, weight, and BMI due to telephone visit type  Hearing/Vision  . Jimeka did not seem to have difficulty with hearing/understanding during the telephone conversation . Reports that she has not had a formal eye exam by an eye care professional within the past year . Reports that she has not had a formal hearing evaluation within the past year *Unable to fully assess hearing and vision during telephone visit type  Cognitive Function: 6CIT Screen 09/27/2018  What Year? 0 points  What month? 0 points  What time? 0 points  Count back from 20 0 points  Months in reverse 0 points  Repeat phrase 0 points  Total Score 0   (Normal:0-7, Significant for Dysfunction: >8)  Normal Cognitive Function Screening: Yes   Immunization & Health Maintenance Record Immunization History  Administered Date(s) Administered  . Influenza Split 12/02/2010  . Influenza, High Dose Seasonal PF 11/14/2016, 11/14/2016  . Influenza, Quadrivalent, Recombinant, Inj, Pf 10/18/2017  . Influenza, Seasonal, Injecte, Preservative Fre 10/29/2012, 10/10/2013, 10/13/2014, 02/04/2016, 11/14/2016, 10/18/2017  . Influenza,inj,Quad PF,6+ Mos 10/13/2014, 02/04/2016, 10/18/2017  . Influenza,inj,quad, With Preservative 11/14/2016  . Influenza-Unspecified 12/02/2010, 10/29/2012, 10/10/2013, 10/13/2014, 02/04/2016, 11/14/2016  . Pneumococcal Polysaccharide-23 12/04/2016  . Pneumococcal-Unspecified 12/04/2016    Health Maintenance  Topic Date Due  . TETANUS/TDAP  08/16/1981  . COLONOSCOPY  08/16/2012  . INFLUENZA VACCINE  06/06/2019 (Originally 08/17/2018)  . MAMMOGRAM  02/20/2019  . Hepatitis C Screening  Completed  . HIV Screening  Completed       Assessment  This is a routine wellness examination for [employer].  Health Maintenance: Due or Overdue  Health Maintenance Due  Topic Date Due  . TETANUS/TDAP  08/16/1981  . COLONOSCOPY  08/16/2012  Patient had Colonoscopy on 02/12/2013 at [facility]. Records request sent.  [name] needs  a referral for Community Assistance: Care Management:   no Social Work:    yes Prescription Assistance:  no Nutrition/Diabetes Education:  no   Plan:  Personalized Goals Goals Addressed            This Visit's Progress   . AWV       09/27/2018 AWV Goal: Exercise for General Health   Patient will verbalize understanding of the benefits of increased physical activity:  Exercising regularly is important. It will improve your overall fitness, flexibility, and endurance.  Regular exercise also will improve your overall health. It can help you control your weight, reduce stress, and improve your bone density.  Over the next year, patient will increase physical activity as tolerated with a goal of at least 150 minutes of moderate physical activity per week.   You can tell that you are exercising at a moderate intensity if your heart starts beating faster and you start breathing faster but can still hold a conversation.  Moderate-intensity exercise ideas include:  Walking 1 mile (1.6 km) in about 15 minutes  Biking  Hiking  Golfing  Dancing  Water aerobics  Patient will verbalize understanding of everyday activities that increase physical activity by providing examples like the following: ? Yard work, such as: ? Pushing a Surveyor, mining ? Raking and bagging leaves ? Washing your car ? Pushing a stroller ? Shoveling snow ? Gardening ? Washing windows or floors  Patient will be able to explain general safety guidelines for exercising:   Before you start a new exercise program, talk with your health care provider.  Do not exercise so much that you hurt yourself, feel dizzy, or get very short of breath.  Wear comfortable clothes and wear shoes with good support.  Drink  plenty of water while you exercise to prevent dehydration or heat stroke.  Work out until your breathing and your heartbeat get faster.       Personalized Health Maintenance & Screening Recommendations  Influenza vaccine Td vaccine  Lung Cancer Screening Recommended: yes (Low Dose CT Chest recommended if Age 78-80 years, 30 pack-year currently smoking OR have quit w/in past 15 years) Hepatitis C Screening recommended: no HIV Screening recommended: no  Advanced Directives: Written information was not prepared per patient's request.  Referrals & Orders No orders of the defined types were placed in this encounter.   Follow-up Plan . Follow-up with [name], FNP as planned . We will check on tdap at your next visit . We have requested records for colonoscopy.    I have personally reviewed and noted the following in the patient's chart:   . Medical and social history . Use of alcohol, tobacco or illicit drugs  . Current medications and supplements . Functional ability and status . Nutritional status . Physical activity . Advanced directives . List of other physicians . Hospitalizations, surgeries, and ER visits in previous 12 months . Vitals . Screenings to include cognitive, depression, and falls . Referrals and appointments  In addition, I have reviewed and discussed with Lindsay Rist certain preventive protocols, quality metrics, and best practice recommendations. A written personalized care plan for preventive services as well as general preventive health recommendations is available and can be mailed to the patient at her request.      Sherron Monday, LPN  [id number]  I have reviewed and agree with the above AWV documentation.   Jannifer Rodney, FNP

## 2018-10-08 ENCOUNTER — Ambulatory Visit: Payer: Medicare HMO | Admitting: Licensed Clinical Social Worker

## 2018-10-08 DIAGNOSIS — Z598 Other problems related to housing and economic circumstances: Secondary | ICD-10-CM

## 2018-10-08 DIAGNOSIS — Z599 Problem related to housing and economic circumstances, unspecified: Secondary | ICD-10-CM

## 2018-10-08 NOTE — Patient Instructions (Signed)
Licensed Clinical Social Worker Visit Information  Materials provided: No  Ms. Wyke was given information about Chronic Care Management services today including:  1. CCM service includes personalized support from designated clinical staff supervised by her physician, including individualized plan of care and coordination with other care providers 2. 24/7 contact phone numbers for assistance for urgent and routine care needs. 3. Service will only be billed when office clinical staff spend 20 minutes or more in a month to coordinate care. 4. Only one practitioner may furnish and bill the service in a calendar month. 5. The patient may stop CCM services at any time (effective at the end of the month) by phone call to the office staff. 6. The patient will be responsible for cost sharing (co-pay) of up to 20% of the service fee (after annual deductible is met).  Patient did not agree to services and wishes to consider information provided before deciding about enrollment in care management services.   The patient verbalized understanding of instructions provided today and declined a print copy of patient instruction materials.   Follow up plan: LCSW to call client in next 3 weeks to talk further with client at that time about CCM program services available  Norva Riffle.Holt Woolbright MSW, LCSW Licensed Clinical Social Worker [facility] [phone]

## 2018-10-08 NOTE — Chronic Care Management (AMB) (Addendum)
  Care Management Note   Lindsay Bradford is a 58 y.o. year old female who is a primary care patient of Lindsay Balloon, FNP. The CM team was consulted for assistance with chronic disease management and care coordination.   I reached out to [employer] by phone today.   Lindsay Bradford was given information about Chronic Care Management services today including:  1. CCM service includes personalized support from designated clinical staff supervised by her physician, including individualized plan of care and coordination with other care providers 2. 24/7 contact phone numbers for assistance for urgent and routine care needs. 3. Service will only be billed when office clinical staff spend 20 minutes or more in a month to coordinate care. 4. Only one practitioner may furnish and bill the service in a calendar month. 5. The patient may stop CCM services at any time (effective at the end of the month) by phone call to the office staff. 6. The patient will be responsible for cost sharing (co-pay) of up to 20% of the service fee (after annual deductible is met). Patient did not agree to services and wishes to consider information provided before deciding about enrollment in care management services.    Review of patient status, including review of consultants reports, relevant laboratory and other test results, and collaboration with appropriate care team members and the patient's provider was performed as part of comprehensive patient evaluation and provision of chronic care management services.   Medications   New medications from outside sources are available for reconciliation   albuterol (VENTOLIN HFA) 108 (90 Base) MCG/ACT inhaler    aspirin 81 MG EC tablet    carvedilol (COREG) 6.25 MG tablet    DULoxetine (CYMBALTA) 20 MG capsule    Omega-3 Fatty Acids (FISH OIL OMEGA-3 PO)    pantoprazole (PROTONIX) 40 MG tablet    rosuvastatin (CRESTOR) 40 MG tablet    tiotropium (SPIRIVA HANDIHALER) 18  MCG inhalation capsule    triamcinolone cream (KENALOG) 0.1 %    valACYclovir (VALTREX) 500 MG tablet    VITAMIN E PO    XARELTO 20 MG TABS tablet    LCSW spoke via phone with Lindsay Bradford today. LCSW described the CCM program and services to Lindsay Bradford today.  She seemed interested but was not sure if she would participate. She spoke of financial challenges and decreased support network.  LCSW described to her that if she participated in CCM program that Lindsay Bradford would develop with her care plan to help work on specific goals to benefit her. She asked if LCSW could call back in next 3 weeks to talk with her further at that time about CCM program services. LCSW thanked Lindsay Bradford for phone call with LCSW on 10/08/2018  Follow Up Plan: LCSW to call Lindsay Bradford in next 3 weeks to talk further with her at that time about CCM program services available.  Lindsay Bradford.Lindsay Bradford MSW, LCSW Licensed Clinical Social Worker [facility] [phone]  I have reviewed and agree with the above  documentation.   Lindsay Dun, FNP

## 2018-10-13 ENCOUNTER — Emergency Department (HOSPITAL_COMMUNITY): Payer: Medicare HMO

## 2018-10-13 ENCOUNTER — Emergency Department (HOSPITAL_COMMUNITY)
Admission: EM | Admit: 2018-10-13 | Discharge: 2018-10-13 | Disposition: A | Discharge status: Home / Self Care | Payer: Medicare HMO | Attending: Emergency Medicine | Admitting: Emergency Medicine

## 2018-10-13 ENCOUNTER — Other Ambulatory Visit: Payer: Self-pay

## 2018-10-13 ENCOUNTER — Encounter (HOSPITAL_COMMUNITY): Payer: Self-pay | Admitting: Emergency Medicine

## 2018-10-13 DIAGNOSIS — S8251XA Displaced fracture of medial malleolus of right tibia, initial encounter for closed fracture: Secondary | ICD-10-CM | POA: Diagnosis not present

## 2018-10-13 DIAGNOSIS — Y999 Unspecified external cause status: Secondary | ICD-10-CM | POA: Insufficient documentation

## 2018-10-13 DIAGNOSIS — R52 Pain, unspecified: Secondary | ICD-10-CM | POA: Diagnosis not present

## 2018-10-13 DIAGNOSIS — S8991XA Unspecified injury of right lower leg, initial encounter: Secondary | ICD-10-CM | POA: Diagnosis not present

## 2018-10-13 DIAGNOSIS — I259 Chronic ischemic heart disease, unspecified: Secondary | ICD-10-CM | POA: Diagnosis not present

## 2018-10-13 DIAGNOSIS — Z7901 Long term (current) use of anticoagulants: Secondary | ICD-10-CM | POA: Diagnosis not present

## 2018-10-13 DIAGNOSIS — S89391A Other physeal fracture of lower end of right fibula, initial encounter for closed fracture: Secondary | ICD-10-CM | POA: Diagnosis not present

## 2018-10-13 DIAGNOSIS — J449 Chronic obstructive pulmonary disease, unspecified: Secondary | ICD-10-CM | POA: Diagnosis not present

## 2018-10-13 DIAGNOSIS — S82841A Displaced bimalleolar fracture of right lower leg, initial encounter for closed fracture: Secondary | ICD-10-CM | POA: Diagnosis not present

## 2018-10-13 DIAGNOSIS — S99911A Unspecified injury of right ankle, initial encounter: Secondary | ICD-10-CM | POA: Diagnosis present

## 2018-10-13 DIAGNOSIS — S82831A Other fracture of upper and lower end of right fibula, initial encounter for closed fracture: Secondary | ICD-10-CM

## 2018-10-13 DIAGNOSIS — F17210 Nicotine dependence, cigarettes, uncomplicated: Secondary | ICD-10-CM | POA: Diagnosis not present

## 2018-10-13 DIAGNOSIS — S80211A Abrasion, right knee, initial encounter: Secondary | ICD-10-CM | POA: Diagnosis not present

## 2018-10-13 DIAGNOSIS — D6851 Activated protein C resistance: Secondary | ICD-10-CM | POA: Diagnosis not present

## 2018-10-13 DIAGNOSIS — Y9289 Other specified places as the place of occurrence of the external cause: Secondary | ICD-10-CM | POA: Diagnosis not present

## 2018-10-13 DIAGNOSIS — Y9389 Activity, other specified: Secondary | ICD-10-CM | POA: Insufficient documentation

## 2018-10-13 DIAGNOSIS — Z79899 Other long term (current) drug therapy: Secondary | ICD-10-CM | POA: Diagnosis not present

## 2018-10-13 DIAGNOSIS — S8291XA Unspecified fracture of right lower leg, initial encounter for closed fracture: Secondary | ICD-10-CM | POA: Diagnosis not present

## 2018-10-13 DIAGNOSIS — Z7982 Long term (current) use of aspirin: Secondary | ICD-10-CM | POA: Insufficient documentation

## 2018-10-13 DIAGNOSIS — S82844A Nondisplaced bimalleolar fracture of right lower leg, initial encounter for closed fracture: Secondary | ICD-10-CM | POA: Diagnosis not present

## 2018-10-13 DIAGNOSIS — M25561 Pain in right knee: Secondary | ICD-10-CM | POA: Diagnosis not present

## 2018-10-13 DIAGNOSIS — W108XXA Fall (on) (from) other stairs and steps, initial encounter: Secondary | ICD-10-CM | POA: Insufficient documentation

## 2018-10-13 DIAGNOSIS — W19XXXA Unspecified fall, initial encounter: Secondary | ICD-10-CM | POA: Diagnosis not present

## 2018-10-13 MED ORDER — OXYCODONE-ACETAMINOPHEN 5-325 MG PO TABS
1.0000 | ORAL_TABLET | Freq: Once | ORAL | Status: AC
  Administered 2018-10-13: 1 via ORAL
  Filled 2018-10-13: qty 1

## 2018-10-13 MED ORDER — BACITRACIN ZINC 500 UNIT/GM EX OINT
TOPICAL_OINTMENT | CUTANEOUS | Status: AC
  Filled 2018-10-13: qty 0.9

## 2018-10-13 MED ORDER — HYDROCODONE-ACETAMINOPHEN 5-325 MG PO TABS: 1.0000 | ORAL_TABLET | ORAL | 0 refills | Status: DC | PRN

## 2018-10-13 NOTE — ED Triage Notes (Signed)
Patient here from home with complaints of fall down stairs. Injury to right ankle. Swelling noted, splinted.

## 2018-10-13 NOTE — ED Provider Notes (Signed)
[facility]-EMERGENCY DEPT Provider Note   CSN: [id number] Arrival date & time: 10/13/18  2032     History   Chief Complaint Chief Complaint  Patient presents with  . Ankle Pain    HPI Lindsay Bradford is a 58 y.o. female.     58 year old female presents with complaint of right leg injury from a fall.  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  Patient reports feeling a snap in her right lower leg.  Patient is unable to walk after the injury.  Denies hitting her head or loss of consciousness.  Denies pain in her upper extremities.  Patient reports minor abrasion to her left knee otherwise no significant change of the left leg.  Patient has a history of factor V Leiden and is compliant with Xarelto.  No other injuries, complaints, concerns.     Past Medical History:  Diagnosis Date  . Asthma   . Bursitis of left hip   . COPD (chronic obstructive pulmonary disease) (HCC)   . Depression   . DVT (deep vein thrombosis) in pregnancy   . Factor V Leiden (HCC)   . GERD (gastroesophageal reflux disease)   . Herpes simplex type 2 infection   . Hyperlipidemia   . MI (myocardial infarction) (HCC)   . Stroke [facility])     Patient Active Problem List   Diagnosis Date Noted  . Coronary artery disease involving native coronary artery of native heart 08/26/2018  . Morbid obesity (HCC) 08/26/2018  . NSVT (nonsustained ventricular tachycardia) (HCC) 06/18/2018  . Ischemic cardiomyopathy 03/13/2018  . S/P coronary artery stent placement 03/13/2018  . Dyslipidemia 08/02/2017  . Peripheral arterial disease (HCC) 06/06/2017  . CAD S/P percutaneous coronary angioplasty 08/25/2016  . Spondylolisthesis of lumbosacral region 01/14/2014  . Moderate episode of recurrent major depressive disorder (HCC) 08/29/2013  . Arthritis 04/05/2012  . History of DVT (deep vein thrombosis) 02/02/2012  .  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 Factor V Leiden (HCC) 03/21/2011  . History of CVA (cerebrovascular accident) 03/21/2011    Past Surgical History:  Procedure Laterality Date  . ABDOMINAL HYSTERECTOMY    . APPENDECTOMY    . CARDIAC CATHETERIZATION    . FOOT SURGERY Bilateral   . SHOULDER SURGERY Right   . VEIN BYPASS SURGERY       OB History   No obstetric history on file.      Home Medications    Prior to Admission medications   Medication Sig Start Date End Date Taking? Authorizing Provider  albuterol (VENTOLIN HFA) 108 (90 Base) MCG/ACT inhaler TAKE 2 PUFFS BY MOUTH EVERY 6 HOURS AS NEEDED FOR WHEEZE OR SHORTNESS OF BREATH 09/10/18   Jannifer Rodney A, FNP  aspirin 81 MG EC tablet Take 81 mg by mouth daily.    [provider]  carvedilol (COREG) 6.25 MG tablet Take 6.25 mg by mouth 2 (two) times daily. 03/19/18   [provider]  DULoxetine (CYMBALTA) 20 MG capsule Please specify directions, refills and quantity 08/27/18   Jannifer Rodney A, FNP  HYDROcodone-acetaminophen (NORCO/VICODIN) 5-325 MG tablet Take 1 tablet by mouth every 4 (four) hours as needed. 10/13/18   Jeannie Fend, PA-C  Omega-3 Fatty Acids (FISH OIL OMEGA-3 PO) Take 1 capsule by mouth daily.    [provider]  pantoprazole (PROTONIX) 40 MG tablet Take 1 tablet (40 mg total) by mouth  daily. 08/26/18   Evelina Dun A, FNP  rosuvastatin (CRESTOR) 40 MG tablet Take 1 tablet (40 mg total) by mouth daily. 08/26/18   Sharion Balloon, FNP  tiotropium (SPIRIVA HANDIHALER) 18 MCG inhalation capsule Place 1 capsule (18 mcg total) into inhaler and inhale at bedtime. 08/26/18   Evelina Dun A, FNP  triamcinolone cream (KENALOG) 0.1 % APPLY TO AFFECTED AREA TWICE A DAY 09/10/18   Hawks, Christy A, FNP  VITAMIN E PO Take 1 capsule by mouth daily.    [provider]  XARELTO 20 MG TABS tablet Take 1 tablet (20 mg total) by mouth daily. 08/26/18   Sharion Balloon, FNP    Family  History Family History  Problem Relation Age of Onset  . Arthritis Mother   . Cancer Mother   . Colon cancer Mother   . Breast cancer Mother   . COPD Father   . COPD Brother   . Heart disease Sister   . COPD Brother   . Stroke Brother   . Heart disease Brother   . Clotting disorder Brother   . Colon cancer Maternal Grandmother   . Cancer Maternal Grandmother   . Breast cancer Maternal Grandmother   . Cancer Paternal Grandmother   . Colon cancer Paternal Grandmother   . Cancer Nephew   . Colon cancer Nephew     Social History Social History   Tobacco Use  . Smoking status: Heavy Tobacco Smoker    Types: Cigarettes  . Smokeless tobacco: Never Used  . Tobacco comment: started back this year and smokes 1/2 pack daily  Substance Use Topics  . Alcohol use: Not Currently  . Drug use: Not Currently     Allergies   Cephalexin, Clarithromycin, Ibuprofen, Other, Oxymorphone, Penicillins, Potassium chloride, Sulfa antibiotics, Clindamycin, Fentanyl, Naproxen, Iodinated diagnostic agents, and Tetracycline   Review of Systems Review of Systems  Constitutional: Negative for fever.  Musculoskeletal: Positive for arthralgias, gait problem, joint swelling and myalgias. Negative for back pain and neck pain.  Skin: Positive for wound.  Neurological: Negative for weakness and numbness.  Hematological: Bruises/bleeds easily.  All other systems reviewed and are negative.    Physical Exam Updated Vital Signs BP (!) 122/92 (BP Location: Left Arm)   Pulse 81   Temp 98 F (36.7 C) (Oral)   Resp 18   SpO2 97%   Physical Exam Vitals signs and nursing note reviewed.  Constitutional:      General: She is not in acute distress.    Appearance: She is well-developed. She is not diaphoretic.  HENT:     Head: Normocephalic and atraumatic.  Cardiovascular:     Pulses: Normal pulses.     Comments: Pulses assessed with bedside Doppler. Pulmonary:     Effort: Pulmonary effort is  normal.  Musculoskeletal:        General: Swelling, tenderness and signs of injury present. No deformity.     Right hip: Normal.     Right knee: She exhibits no swelling, no effusion, no ecchymosis and no deformity. Tenderness found.     Right ankle: She exhibits decreased range of motion and swelling. She exhibits no ecchymosis, no deformity, no laceration and normal pulse. Tenderness. Lateral malleolus and medial malleolus tenderness found. No head of 5th metatarsal and no proximal fibula tenderness found.       Legs:     Right foot: Decreased range of motion. Normal capillary refill. Tenderness present. No swelling, crepitus, deformity or laceration.  Skin:  General: Skin is warm and dry.     Findings: No erythema or rash.  Neurological:     Mental Status: She is alert and oriented to person, place, and time.  Psychiatric:        Behavior: Behavior normal.      ED Treatments / Results  Labs (all labs ordered are listed, but only abnormal results are displayed) Labs Reviewed - No data to display  EKG None  Radiology Dg Ankle Complete Right  Result Date: 10/13/2018 CLINICAL DATA:  Pain after fall EXAM: RIGHT ANKLE - COMPLETE 3+ VIEW COMPARISON:  None. FINDINGS: There is a fracture through the distal fibula above the ankle. There is a fracture through the medial malleolus. There is also a pulse tear malleolar fracture. The ankle mortise is intact. IMPRESSION: 1. Fractures are seen through the posterior tibia/posterior malleolus, the medial malleolus, and the distal fibular diaphysis above the ankle. The ankle mortise is intact. Electronically Signed   By: Gerome Sam III M.D   On: 10/13/2018 21:31   Dg Knee Complete 4 Views Right  Result Date: 10/13/2018 CLINICAL DATA:  Pain after fall. EXAM: RIGHT KNEE - COMPLETE 4+ VIEW COMPARISON:  None. FINDINGS: No evidence of fracture, dislocation, or joint effusion. No evidence of arthropathy or other focal bone abnormality. Soft  tissues are unremarkable. IMPRESSION: Negative. Electronically Signed   By: Gerome Sam III M.D   On: 10/13/2018 21:29   Dg Foot Complete Right  Result Date: 10/13/2018 CLINICAL DATA:  Pain after fall EXAM: RIGHT FOOT COMPLETE - 3+ VIEW COMPARISON:  None. FINDINGS: Ankle fractures described on the ankle films are seen on this study as well. No acute fractures seen within the foot. IMPRESSION: Ankle fractures described on the ankle films. No foot fractures noted. Electronically Signed   By: Gerome Sam III M.D   On: 10/13/2018 21:32    Procedures Procedures (including critical care time)  Medications Ordered in ED Medications  bacitracin 500 UNIT/GM ointment (has no administration in time range)  oxyCODONE-acetaminophen (PERCOCET/ROXICET) 5-325 MG per tablet 1 tablet (1 tablet Oral Given 10/13/18 2209)   SPLINT APPLICATION Date/Time: 10:57 PM Authorized by: Jeannie Fend Consent: Verbal consent obtained. Risks and benefits: risks, benefits and alternatives were discussed Consent given by: patient Splint applied by: orthopedic technician Location details: short leg right Splint type: OCL Supplies used: OCL, ace, padding Post-procedure: The splinted body part was neurovascularly unchanged following the procedure. Patient tolerance: Patient tolerated the procedure well with no immediate complications.     Initial Impression / Assessment and Plan / ED Course  I have reviewed the triage vital signs and the nursing notes.  Pertinent labs & imaging results that were available during my care of the patient were reviewed by me and considered in my medical decision making (see chart for details).  Clinical Course as of Oct 13 2255  Sun Oct 13, 2018  1637 59 year old female presents with injuries from a fall.  Patient has abrasions to both of her knees, and pain in her right leg, ankle, right foot.  X-rays of same show a right fibula fracture, fracture with medial malleolus fracture  and posterior malleolus fracture   [LM]  2256 Patient was given Percocet for her pain, prescription for Norco sent to her pharmacy of choice.  Patient was placed in a splint and referred to orthopedics for follow-up.  Patient given crutches advised to be nonweightbearing, recommend elevate and apply ice.  Return to ER for any new or  worsening symptoms otherwise follow-up with orthopedics.   [LM]    Clinical Course User Index [LM] Jeannie FendMurphy, Morrisa Aldaba A, PA-C      Final Clinical Impressions(s) / ED Diagnoses   Final diagnoses:  Closed bimalleolar fracture of right ankle, initial encounter  Other closed fracture of distal end of right fibula, initial encounter  Abrasion of right knee, initial encounter    ED Discharge Orders         Ordered    HYDROcodone-acetaminophen (NORCO/VICODIN) 5-325 MG tablet  Every 4 hours PRN     10/13/18 2209           Jeannie FendMurphy, Benjamin Merrihew A, PA-C 10/13/18 2257    Gwyneth SproutPlunkett, Whitney, MD 10/13/18 2315

## 2018-10-13 NOTE — Discharge Instructions (Signed)
Use crutches, do NOT try and walk on your right foot. Elevate the leg above the level of your heart and apply ice for 30 minutes at a time. Take Norco as needed as prescribed for pain. Follow up with orthopedics, call tomorrow to schedule an appointment.

## 2018-10-14 ENCOUNTER — Telehealth: Payer: Self-pay | Admitting: Family

## 2018-10-14 NOTE — Telephone Encounter (Signed)
Patient aware and verbalizes understanding. 

## 2018-10-14 NOTE — Telephone Encounter (Signed)
I am not sure insurance will pay for a wheelchair since this is a temporary condition. Try to rent one from [facility] first.

## 2018-10-15 ENCOUNTER — Ambulatory Visit: Payer: Self-pay | Admitting: Student

## 2018-10-15 ENCOUNTER — Other Ambulatory Visit (HOSPITAL_COMMUNITY)
Admission: RE | Admit: 2018-10-15 | Discharge: 2018-10-15 | Disposition: A | Discharge status: Home / Self Care | Payer: Medicare HMO | Source: Ambulatory Visit | Attending: Student | Admitting: Student

## 2018-10-15 ENCOUNTER — Telehealth: Payer: Self-pay | Admitting: *Deleted

## 2018-10-15 DIAGNOSIS — S82851A Displaced trimalleolar fracture of right lower leg, initial encounter for closed fracture: Secondary | ICD-10-CM | POA: Insufficient documentation

## 2018-10-15 DIAGNOSIS — Z01812 Encounter for preprocedural laboratory examination: Secondary | ICD-10-CM | POA: Insufficient documentation

## 2018-10-15 DIAGNOSIS — Z20828 Contact with and (suspected) exposure to other viral communicable diseases: Secondary | ICD-10-CM | POA: Diagnosis not present

## 2018-10-15 DIAGNOSIS — M25571 Pain in right ankle and joints of right foot: Secondary | ICD-10-CM | POA: Diagnosis not present

## 2018-10-15 LAB — SARS CORONAVIRUS 2 (TAT 6-24 HRS): SARS Coronavirus 2: NEGATIVE

## 2018-10-15 NOTE — Telephone Encounter (Signed)
Patient is requesting an rx for wheelchair

## 2018-10-17 ENCOUNTER — Other Ambulatory Visit: Payer: Self-pay

## 2018-10-17 ENCOUNTER — Encounter (HOSPITAL_COMMUNITY): Payer: Self-pay | Admitting: *Deleted

## 2018-10-17 MED ORDER — VANCOMYCIN HCL 10 G IV SOLR
1500.0000 mg | INTRAVENOUS | Status: AC
  Administered 2018-10-18: 08:00:00 1500 mg via INTRAVENOUS
  Filled 2018-10-17 (×2): qty 1500

## 2018-10-17 NOTE — Anesthesia Preprocedure Evaluation (Addendum)
Anesthesia Evaluation  Patient identified by MRN, date of birth, ID band Patient awake    Reviewed: Allergy & Precautions, NPO status , Patient's Chart, lab work & pertinent test results  Airway Mallampati: II  TM Distance: >3 FB Neck ROM: Full    Dental  (+) Dental Advisory Given   Pulmonary asthma , pneumonia, COPD, Current Smoker,    Pulmonary exam normal breath sounds clear to auscultation       Cardiovascular + CAD, + Past MI and + Peripheral Vascular Disease  Normal cardiovascular exam Rhythm:Regular Rate:Normal     Neuro/Psych  Headaches, PSYCHIATRIC DISORDERS Depression  Neuromuscular disease CVA    GI/Hepatic Neg liver ROS, GERD  ,  Endo/Other  negative endocrine ROS  Renal/GU negative Renal ROS     Musculoskeletal  (+) Arthritis , Fibromyalgia -  Abdominal (+) + obese,   Peds  Hematology negative hematology ROS (+)   Anesthesia Other Findings   Reproductive/Obstetrics negative OB ROS                            Anesthesia Physical Anesthesia Plan  ASA: III  Anesthesia Plan: General   Post-op Pain Management: GA combined w/ Regional for post-op pain   Induction: Intravenous  PONV Risk Score and Plan: 2 and Ondansetron, Dexamethasone and Midazolam  Airway Management Planned: LMA  Additional Equipment: None  Intra-op Plan:   Post-operative Plan: Extubation in OR  Informed Consent: I have reviewed the patients History and Physical, chart, labs and discussed the procedure including the risks, benefits and alternatives for the proposed anesthesia with the patient or authorized representative who has indicated his/her understanding and acceptance.     Dental advisory given  Plan Discussed with: CRNA  Anesthesia Plan Comments: (PAT note written 10/17/2018 by Myra Gianotti, PA-C. )       Anesthesia Quick Evaluation

## 2018-10-17 NOTE — H&P (Signed)
Orthopaedic Trauma Service (OTS)  H&P  Patient ID: Lindsay Bradford MRN: [id number] DOB/AGE: Feb 02, 1960 58 y.o.  Reason for Surgery: Right trimalleolar ankle fracture  HPI: Lindsay Bradford is an 58 y.o. female presenting for surgery of right ankle. Patient had a fall on 10/13/18, landed on her right side. Was seen in [facility] emergency department where imaging showed a right trimalleolar ankle fracture. Splint was applied in the emergency room and patient was instructed to follow up with orthopaedics in outpatient setting to discuss surgical fxation.  Patient presented to OTS clinic on 10/15/18. Continues to have pain in right leg but overall pain has been fairly well controlled. Denies any significant numbness or tingling in the extremity. Denies any additional injuries from her fall. Patient has a history of factor V Leiden and is compliant with Xarelto. Smokes about 1/2 pack per day  Past Medical History:  Diagnosis Date  . Arthritis   . Asthma   . Bursitis of left hip   . COPD (chronic obstructive pulmonary disease) ([place])   . Depression   . DVT (deep venous thrombosis) (HCC)    left  . Factor V Leiden ([place])   . Fibromyalgia   . GERD (gastroesophageal reflux disease)   . Headache    sesaonal  . Herpes simplex type 2 infection   . History of blood transfusion   . Hyperlipidemia   . Hypotension    90's, Normally; in pain normal; BP  . MI (myocardial infarction) ([place])    x 2 2000   . Pneumonia   . Stroke ([place]) 02/27/2018   no residual    Past Surgical History:  Procedure Laterality Date  . ABDOMINAL HYSTERECTOMY    . APPENDECTOMY    . CARDIAC CATHETERIZATION    . COLONOSCOPY    . FOOT SURGERY Bilateral   . SHOULDER SURGERY Right   . VEIN BYPASS SURGERY Left     Family History  Problem Relation Age of Onset  . Arthritis Mother   . Cancer Mother   . Colon cancer Mother   . Breast cancer Mother   . COPD Father   . COPD Brother   . Heart disease Sister   . COPD  Brother   . Stroke Brother   . Heart disease Brother   . Clotting disorder Brother   . Colon cancer Maternal Grandmother   . Cancer Maternal Grandmother   . Breast cancer Maternal Grandmother   . Cancer Paternal Grandmother   . Colon cancer Paternal Grandmother   . Cancer Nephew   . Colon cancer Nephew     Social History:  reports that she has been smoking cigarettes. She has a 17.50 pack-year smoking history. She has never used smokeless tobacco. She reports previous alcohol use. She reports previous drug use.  Allergies:  Allergies  Allergen Reactions  . Cephalexin Hives  . Clarithromycin Hives  . Ibuprofen Hives  . Other Itching and Other (See Comments)    Whole milk and oranges  . Oxymorphone Palpitations  . Penicillins Anaphylaxis    Did it involve swelling of the face/tongue/throat, SOB, or low BP? Yes Did it involve sudden or severe rash/hives, skin peeling, or any reaction on the inside of your mouth or nose? Yes Did you need to seek medical attention at a hospital or doctor's office? Yes When did it last happen?childhood allergy If all above answers are "NO", may proceed with cephalosporin use.   [name] Potassium Chloride Hives    Reacts to oral KCl,  tolerates it slow IV   . Sulfa Antibiotics Anaphylaxis  . Clindamycin Hives and Itching  . Fentanyl Hives and Other (See Comments)    Fentanyl patch, it breaks pt out in hives and lowers her blood pressure.   . Naproxen Hives  . Iodinated Diagnostic Agents Hives  . Tetracycline Hives and Itching    Medications: I have reviewed the patient's current medications.  ROS: Constitutional: No fever or chills Vision: No changes in vision ENT: No difficulty swallowing CV: No chest pain Pulm: No SOB or wheezing GI: No nausea or vomiting GU: No urgency or inability to hold urine Skin: No poor wound healing Neurologic: No numbness or tingling Psychiatric: No depression or anxiety Heme: +hx of Factor V Leiden   Allergic: No reaction to medications or food   Exam: Height 5\' 7"  (1.702 m), weight 104.3 kg. General: NAD Orientation: Alert and oriented x 3 Mood and Affect: Mood and affect appropriate, pleasant and cooperative Gait: Not assessed due to known fracture Coordination and balance: Within normal limits  Right Lower Extremity: Short leg splint in place. Abrasion noted over anterior knee. Able to wiggle toes. Toes warm and well perfused. Otherwise neurovascularly intact  Left Lower Extremity: Abrasion noted over anterior knee, otherwise skin without lesions. No tenderness to palpation. Full painless ROM, full strength in each muscle groups without evidence of instability.   Medical Decision Making: Data: Imaging: X-ray of right ankle show fractures through the posterior tibia/posterior malleolus, the medial malleolus, and the distal fibular diaphysis above the ankle. Ankle mortise is intact.  Labs:  Results for orders placed or performed during the hospital encounter of 10/15/18 (from the past 168 hour(s))  SARS CORONAVIRUS 2 (TAT 6-24 HRS) Nasopharyngeal Nasopharyngeal Swab   Collection Time: 10/15/18 12:13 PM   Specimen: Nasopharyngeal Swab  Result Value Ref Range   SARS Coronavirus 2 NEGATIVE NEGATIVE     Medical history and chart was reviewed and case discussed with medical provider.  Assessment/Plan: 58 year old female with history of Factor V Leiden currently on Xarelto status post fall, resulting in right trimalleolar ankle fracture.  I would recommend proceeding with open reduction internal fixation of right ankle fracture. Patient will likely be placed back into a short leg splint post-operatively and will be non-weightbearing to right lower extremity. Risks of surgery discussed with patient included bleeding requiring blood transfusion, bleeding causing a hematoma, infection, malunion, nonunion, damage to surrounding nerves and blood vessels, pain, hardware prominence or  irritation, hardware failure, stiffness, post-traumatic arthritis, DVT/PE, compartment syndrome, and even anesthesia complications. She understands these risks and would like to proceed with surgery. All questions were answered.   Lindsay Bradford A. [facility] ?[phone]? (phone)

## 2018-10-17 NOTE — Progress Notes (Addendum)
Ms Silba denies chest pain or shortness of breath. Patient was tested for Covid and has been in quarantine since that time. Ms Bober states that she has not had any syncopal spells in a few months. Ms Sax is on ASA and Xarelto, she stopped Xarelto, continue ASAMonday, 10/14/2018; was instructed to start Lovenox- she will start today.  I instructed patient to follow prescribing MD's instructions regarding am Lovenox. (patient has not picked medication up, so she does not know what the instructions are.) Patient was instucted to continue ASA.

## 2018-10-17 NOTE — Progress Notes (Signed)
Anesthesia Chart Review: SAME DAY WORK-UP   Case: [id number] Date/Time: 10/18/18 0901   Procedure: OPEN REDUCTION INTERNAL FIXATION (ORIF) ANKLE FRACTURE (Right Ankle)   Anesthesia type: General   Diagnosis: Closed displaced trimalleolar fracture of right ankle, initial encounter [S82.851A]   Pre-op diagnosis: Right trimalleolar ankle fracture   Location: [facility] ROOM 06 / [facility] OR   Surgeon: Roby Lofts, MD      DISCUSSION: Patient is a 58 year old female scheduled for the above procedure. Fell on 10/13/18 and sustained a right trimalleolar ankle fracture.   History includes smoking, COPD, HLD, HTN (and hypotension), asthma, GERD, Factor V Leiden with LLE DVT (on Xarelto), CVA (2000, 2010, no residual), CAD/MI (x2, 02/1998 at [facility] in [place], [place], s/p stent x2, specifics unknown), ischemic cardiomyopathy, GERD, fibromyalgia, PAD (AFBG 02/2010 by [facility] records).  She had ILR inserted by EP cardiology in June 2020 after event monitor showed an episode of NSVT.   She reported instructions to continue ASA, hold Xarelto starting 10/14/18 and is starting/getting Lovenox instructions today. I spoke with Dr. Jena Gauss. He is managing the Lovenox bridge following his discussion with [facility] cardiologist.   Awaiting 03/2018 echo report from [facility], but EF recorded in notes as 35-40%. She had recent ILR interrogation and EP and primary cardiology visits within the past 6-7 months. Notes are in [facility].  She is a same day work-up, so labs and further anesthesia team evaluation on the day of surgery. COVID-19 test negative on 10/15/18.   VS: Ht 5\' 7"  (1.702 m)   Wt 104.3 kg   BMI 36.02 kg/m   PROVIDERS: , FNP is PCP [name], MD is primary cardiologist. Last visit 03/13/18. [facility] Everywhere).  [facility], MD is EP cardiologist. [facility] Everywhere).   LABS: For day of surgery. Cr 0.74, H/H 14.6/43.4, PLT 325 on 08/26/18.   IMAGES: DG right  ankle 10/13/18: IMPRESSION: 1. Fractures are seen through the posterior tibia/posterior malleolus, the medial malleolus, and the distal fibular diaphysis above the ankle. The ankle mortise is intact.   CXR 04/29/18: FINDINGS: The lungs are hyperinflated likely secondary to COPD. There is mild left basilar airspace disease likely reflecting atelectasis. There is no focal consolidation. There is no pleural effusion or pneumothorax. The heart and mediastinal contours are unremarkable. The osseous structures are unremarkable. IMPRESSION: No active cardiopulmonary disease.    EKG: ILR Device Interrogation 10/08/18 ([facility]):  I have reviewed this device interrogation and agree with the findings as  documented above. Normal device function with acceptable battery charge. One episode mis-classified as asystole demonstrates a regular ventricular rhythm with substantial baseline artifact impeding adequate classification.  10/10/18, MD, [facility], [facility]  Clinical Cardiac Electrophysiology   EKG 04/29/18:  Sinus rhythm Borderline left axis deviation Anterior infarct, old  EKG 04/03/18 ([facility]): Tracing requested. Result Narrative Sinus rhythm  Left anterior fascicular block Anterior infarct, age indeterminate Nonspecific T-wave abnormality Abnormal ECG   CV: EP Study/Insertion of loop recorder 06/24/18 ([facility]): Conclusions  1. Normal atrial tissue conduction.  2. Normal atrioventricular nodal conduction.   3. Normal His-Purkinje system function. HV = 49 msec.  4. Normal ventricular tissue conduction.  5. No inducible sustained arrhythmias.   6. Successful implantation of a Biotronik insertable loop recorder.  Plan  1. Observation in recovery prior to discharge.  2. Follow up in device clinic.    30 day Cardiac Event monitor 04/05/18-04/1018 ([facility]): Result Narrative Nine days 8  hours were recorded. The predominant rhythm was sinus, average 84 bpm with  range 65-145 bpm. Rare PACs and PVCs and one episode of NSVT was recorded. No AT/AF. No pauses. Patient-triggered events correlated with sinus rhythm.  48 Hour Holter Monitor 04/01/18 ([facility]): Result Impression: Cardiac event recorder reveals predominantly normal sinus rhythm with an average heart rate of 81. There are occasional isolated PVCs and ventricular couplets and there were 2 ventricular triplets. There is a rare single PAC. No sustained arrhythmias were noted.   Echo 03/27/18: Requested. 04/03/18 EP notes mention "EF of 35-40%." (Comparison echo 06/18/17, [facility], showed LVEF 45%, grade 2 DD, hypokinetic apex)   Nuclear stress test 03/18/18 ([facility]): IMPRESSION:  Abnormal cardiac perfusion exam for moderately large anteroapical infarct with no ischemia. Prognostically this is a low to moderate risk scan FINDINGS:  - Left ventricular ejection fraction is calculated to be 34 %. - Left ventricular ejection fraction of 34%. - Dynamic imaging of the left ventricle demonstrates severe anterior apical hypokinesis. - Regional wall motion abnormality consistent with previous anterior apical MI with moderately reduced left ventricular systolic function.   Past Medical History:  Diagnosis Date  . Arthritis   . Asthma   . Bursitis of left hip   . COPD (chronic obstructive pulmonary disease) ([place])   . Depression   . DVT (deep venous thrombosis) ([facility])    left  . Factor V Leiden ([place])   . Fibromyalgia   . GERD (gastroesophageal reflux disease)   . Headache    sesaonal  . Herpes simplex type 2 infection   . History of blood transfusion   . Hyperlipidemia   . Hypotension    90's, Normally; in pain normal; BP  . MI (myocardial infarction) ([place])    x 2 2000   . Pneumonia   . Stroke ([place]) 02/27/2018   no residual    Past Surgical History:  Procedure Laterality Date  . ABDOMINAL HYSTERECTOMY    . APPENDECTOMY    . CARDIAC CATHETERIZATION    . COLONOSCOPY    . FOOT SURGERY  Bilateral   . SHOULDER SURGERY Right   . VEIN BYPASS SURGERY Left     MEDICATIONS: . [START ON 10/18/2018] vancomycin (VANCOCIN) 1,500 mg in sodium chloride 0.9 % 500 mL IVPB   . albuterol (VENTOLIN HFA) 108 (90 Base) MCG/ACT inhaler  . aspirin 81 MG EC tablet  . carvedilol (COREG) 6.25 MG tablet  . HYDROcodone-acetaminophen (NORCO/VICODIN) 5-325 MG tablet  . nitroGLYCERIN (NITROSTAT) 0.4 MG SL tablet  . pantoprazole (PROTONIX) 40 MG tablet  . rosuvastatin (CRESTOR) 40 MG tablet  . tiotropium (SPIRIVA HANDIHALER) 18 MCG inhalation capsule  . topiramate (TOPAMAX) 100 MG tablet  . triamcinolone cream (KENALOG) 0.1 %  . valACYclovir (VALTREX) 1000 MG tablet  . venlafaxine XR (EFFEXOR-XR) 150 MG 24 hr capsule  . XARELTO 20 MG TABS tablet  . DULoxetine (CYMBALTA) 20 MG capsule     Myra Gianotti, PA-C Surgical Short Stay/Anesthesiology [facility] Phone [phone] [facility] Phone [phone] 10/17/2018 4:33 PM

## 2018-10-17 NOTE — Telephone Encounter (Signed)
Discussed that she should check at [place] to see if she can rent one because I am unsure her insurance will pay for one.

## 2018-10-17 NOTE — Telephone Encounter (Signed)
Patient aware.

## 2018-10-18 ENCOUNTER — Ambulatory Visit (HOSPITAL_COMMUNITY): Payer: Medicare HMO

## 2018-10-18 ENCOUNTER — Observation Stay (HOSPITAL_COMMUNITY): Payer: Medicare HMO

## 2018-10-18 ENCOUNTER — Ambulatory Visit (HOSPITAL_COMMUNITY): Payer: Medicare HMO | Admitting: Vascular Surgery

## 2018-10-18 ENCOUNTER — Encounter (HOSPITAL_COMMUNITY)
Admission: RE | Disposition: A | Discharge status: Home / Self Care | Payer: Self-pay | Source: Home / Self Care | Attending: Student

## 2018-10-18 ENCOUNTER — Encounter (HOSPITAL_COMMUNITY): Payer: Self-pay

## 2018-10-18 ENCOUNTER — Observation Stay (HOSPITAL_COMMUNITY)
Admission: RE | Admit: 2018-10-18 | Discharge: 2018-10-19 | Disposition: A | Discharge status: Home / Self Care | Payer: Medicare HMO | Attending: Student | Admitting: Student

## 2018-10-18 ENCOUNTER — Other Ambulatory Visit: Payer: Self-pay

## 2018-10-18 DIAGNOSIS — Z800 Family history of malignant neoplasm of digestive organs: Secondary | ICD-10-CM | POA: Insufficient documentation

## 2018-10-18 DIAGNOSIS — I739 Peripheral vascular disease, unspecified: Secondary | ICD-10-CM | POA: Insufficient documentation

## 2018-10-18 DIAGNOSIS — I2510 Atherosclerotic heart disease of native coronary artery without angina pectoris: Secondary | ICD-10-CM | POA: Diagnosis not present

## 2018-10-18 DIAGNOSIS — Z7901 Long term (current) use of anticoagulants: Secondary | ICD-10-CM | POA: Diagnosis not present

## 2018-10-18 DIAGNOSIS — M1990 Unspecified osteoarthritis, unspecified site: Secondary | ICD-10-CM | POA: Diagnosis not present

## 2018-10-18 DIAGNOSIS — E669 Obesity, unspecified: Secondary | ICD-10-CM | POA: Insufficient documentation

## 2018-10-18 DIAGNOSIS — E785 Hyperlipidemia, unspecified: Secondary | ICD-10-CM | POA: Diagnosis not present

## 2018-10-18 DIAGNOSIS — Z86718 Personal history of other venous thrombosis and embolism: Secondary | ICD-10-CM | POA: Insufficient documentation

## 2018-10-18 DIAGNOSIS — M797 Fibromyalgia: Secondary | ICD-10-CM | POA: Insufficient documentation

## 2018-10-18 DIAGNOSIS — D6851 Activated protein C resistance: Secondary | ICD-10-CM | POA: Insufficient documentation

## 2018-10-18 DIAGNOSIS — Z823 Family history of stroke: Secondary | ICD-10-CM | POA: Insufficient documentation

## 2018-10-18 DIAGNOSIS — J449 Chronic obstructive pulmonary disease, unspecified: Secondary | ICD-10-CM | POA: Diagnosis not present

## 2018-10-18 DIAGNOSIS — F329 Major depressive disorder, single episode, unspecified: Secondary | ICD-10-CM | POA: Diagnosis not present

## 2018-10-18 DIAGNOSIS — F17200 Nicotine dependence, unspecified, uncomplicated: Secondary | ICD-10-CM | POA: Diagnosis not present

## 2018-10-18 DIAGNOSIS — Z91018 Allergy to other foods: Secondary | ICD-10-CM | POA: Insufficient documentation

## 2018-10-18 DIAGNOSIS — I252 Old myocardial infarction: Secondary | ICD-10-CM | POA: Insufficient documentation

## 2018-10-18 DIAGNOSIS — Z91041 Radiographic dye allergy status: Secondary | ICD-10-CM | POA: Insufficient documentation

## 2018-10-18 DIAGNOSIS — W19XXXA Unspecified fall, initial encounter: Secondary | ICD-10-CM | POA: Insufficient documentation

## 2018-10-18 DIAGNOSIS — F17210 Nicotine dependence, cigarettes, uncomplicated: Secondary | ICD-10-CM | POA: Insufficient documentation

## 2018-10-18 DIAGNOSIS — S82301D Unspecified fracture of lower end of right tibia, subsequent encounter for closed fracture with routine healing: Secondary | ICD-10-CM | POA: Diagnosis not present

## 2018-10-18 DIAGNOSIS — Z23 Encounter for immunization: Secondary | ICD-10-CM | POA: Insufficient documentation

## 2018-10-18 DIAGNOSIS — K219 Gastro-esophageal reflux disease without esophagitis: Secondary | ICD-10-CM | POA: Insufficient documentation

## 2018-10-18 DIAGNOSIS — J439 Emphysema, unspecified: Secondary | ICD-10-CM | POA: Diagnosis not present

## 2018-10-18 DIAGNOSIS — Z90710 Acquired absence of both cervix and uterus: Secondary | ICD-10-CM | POA: Insufficient documentation

## 2018-10-18 DIAGNOSIS — Z880 Allergy status to penicillin: Secondary | ICD-10-CM | POA: Insufficient documentation

## 2018-10-18 DIAGNOSIS — Z7982 Long term (current) use of aspirin: Secondary | ICD-10-CM | POA: Insufficient documentation

## 2018-10-18 DIAGNOSIS — I493 Ventricular premature depolarization: Secondary | ICD-10-CM | POA: Insufficient documentation

## 2018-10-18 DIAGNOSIS — I959 Hypotension, unspecified: Secondary | ICD-10-CM | POA: Insufficient documentation

## 2018-10-18 DIAGNOSIS — I255 Ischemic cardiomyopathy: Secondary | ICD-10-CM | POA: Insufficient documentation

## 2018-10-18 DIAGNOSIS — Z9861 Coronary angioplasty status: Secondary | ICD-10-CM

## 2018-10-18 DIAGNOSIS — Z825 Family history of asthma and other chronic lower respiratory diseases: Secondary | ICD-10-CM | POA: Insufficient documentation

## 2018-10-18 DIAGNOSIS — S82851A Displaced trimalleolar fracture of right lower leg, initial encounter for closed fracture: Principal | ICD-10-CM | POA: Diagnosis present

## 2018-10-18 DIAGNOSIS — G709 Myoneural disorder, unspecified: Secondary | ICD-10-CM | POA: Insufficient documentation

## 2018-10-18 DIAGNOSIS — R519 Headache, unspecified: Secondary | ICD-10-CM | POA: Diagnosis not present

## 2018-10-18 DIAGNOSIS — Z881 Allergy status to other antibiotic agents status: Secondary | ICD-10-CM | POA: Insufficient documentation

## 2018-10-18 DIAGNOSIS — Z803 Family history of malignant neoplasm of breast: Secondary | ICD-10-CM | POA: Insufficient documentation

## 2018-10-18 DIAGNOSIS — Z79899 Other long term (current) drug therapy: Secondary | ICD-10-CM | POA: Insufficient documentation

## 2018-10-18 DIAGNOSIS — Z6836 Body mass index (BMI) 36.0-36.9, adult: Secondary | ICD-10-CM | POA: Diagnosis not present

## 2018-10-18 DIAGNOSIS — Z8673 Personal history of transient ischemic attack (TIA), and cerebral infarction without residual deficits: Secondary | ICD-10-CM | POA: Diagnosis not present

## 2018-10-18 DIAGNOSIS — Z886 Allergy status to analgesic agent status: Secondary | ICD-10-CM | POA: Insufficient documentation

## 2018-10-18 DIAGNOSIS — Z885 Allergy status to narcotic agent status: Secondary | ICD-10-CM | POA: Insufficient documentation

## 2018-10-18 DIAGNOSIS — T148XXA Other injury of unspecified body region, initial encounter: Secondary | ICD-10-CM

## 2018-10-18 DIAGNOSIS — Z832 Family history of diseases of the blood and blood-forming organs and certain disorders involving the immune mechanism: Secondary | ICD-10-CM | POA: Insufficient documentation

## 2018-10-18 DIAGNOSIS — G8918 Other acute postprocedural pain: Secondary | ICD-10-CM | POA: Diagnosis not present

## 2018-10-18 DIAGNOSIS — S8291XD Unspecified fracture of right lower leg, subsequent encounter for closed fracture with routine healing: Secondary | ICD-10-CM | POA: Diagnosis not present

## 2018-10-18 DIAGNOSIS — Z8249 Family history of ischemic heart disease and other diseases of the circulatory system: Secondary | ICD-10-CM | POA: Insufficient documentation

## 2018-10-18 DIAGNOSIS — S82831D Other fracture of upper and lower end of right fibula, subsequent encounter for closed fracture with routine healing: Secondary | ICD-10-CM | POA: Diagnosis not present

## 2018-10-18 DIAGNOSIS — Z882 Allergy status to sulfonamides status: Secondary | ICD-10-CM | POA: Insufficient documentation

## 2018-10-18 DIAGNOSIS — Z8261 Family history of arthritis: Secondary | ICD-10-CM | POA: Insufficient documentation

## 2018-10-18 DIAGNOSIS — I472 Ventricular tachycardia: Secondary | ICD-10-CM | POA: Insufficient documentation

## 2018-10-18 HISTORY — DX: Fibromyalgia: M79.7

## 2018-10-18 HISTORY — DX: Ischemic cardiomyopathy: I25.5

## 2018-10-18 HISTORY — DX: Atherosclerotic heart disease of native coronary artery without angina pectoris: I25.10

## 2018-10-18 HISTORY — DX: Pneumonia, unspecified organism: J18.9

## 2018-10-18 HISTORY — DX: Headache, unspecified: R51.9

## 2018-10-18 HISTORY — DX: Hypotension, unspecified: I95.9

## 2018-10-18 HISTORY — DX: Acute embolism and thrombosis of unspecified deep veins of unspecified lower extremity: I82.409

## 2018-10-18 HISTORY — DX: Unspecified osteoarthritis, unspecified site: M19.90

## 2018-10-18 HISTORY — DX: Personal history of other medical treatment: Z92.89

## 2018-10-18 HISTORY — PX: ORIF ANKLE FRACTURE: SHX5408

## 2018-10-18 LAB — BASIC METABOLIC PANEL
Anion gap: 10 (ref 5–15)
BUN: 7 mg/dL (ref 6–20)
CO2: 25 mmol/L (ref 22–32)
Calcium: 8.7 mg/dL — ABNORMAL LOW (ref 8.9–10.3)
Chloride: 106 mmol/L (ref 98–111)
Creatinine, Ser: 0.76 mg/dL (ref 0.44–1.00)
GFR calc Af Amer: >60 mL/min (ref >60)
GFR calc non Af Amer: >60 mL/min (ref >60)
Glucose, Bld: 106 mg/dL — ABNORMAL HIGH (ref 70–99)
Potassium: 3.2 mmol/L — ABNORMAL LOW (ref 3.5–5.1)
Sodium: 141 mmol/L (ref 135–145)

## 2018-10-18 LAB — POCT I-STAT, CHEM 8
BUN: 8 mg/dL (ref 6–20)
Calcium, Ion: 1.17 mmol/L (ref 1.15–1.40)
Chloride: 106 mmol/L (ref 98–111)
Creatinine, Ser: 0.6 mg/dL (ref 0.44–1.00)
Glucose, Bld: 113 mg/dL — ABNORMAL HIGH (ref 70–99)
HCT: 40 % (ref 36.0–46.0)
Hemoglobin: 13.6 g/dL (ref 12.0–15.0)
Potassium: 3.6 mmol/L (ref 3.5–5.1)
Sodium: 142 mmol/L (ref 135–145)
TCO2: 23 mmol/L (ref 22–32)

## 2018-10-18 LAB — PROTIME-INR
INR: 1 (ref 0.8–1.2)
Prothrombin Time: 13.5 seconds (ref 11.4–15.2)

## 2018-10-18 LAB — CBC
HCT: 42.8 % (ref 36.0–46.0)
Hemoglobin: 13.8 g/dL (ref 12.0–15.0)
MCH: 31.6 pg (ref 26.0–34.0)
MCHC: 32.2 g/dL (ref 30.0–36.0)
MCV: 97.9 fL (ref 80.0–100.0)
Platelets: 280 10*3/uL (ref 150–400)
RBC: 4.37 MIL/uL (ref 3.87–5.11)
RDW: 14.6 % (ref 11.5–15.5)
WBC: 8.3 10*3/uL (ref 4.0–10.5)
nRBC: 0 % (ref 0.0–0.2)

## 2018-10-18 LAB — CREATININE, SERUM
Creatinine, Ser: 0.84 mg/dL (ref 0.44–1.00)
GFR calc Af Amer: >60 mL/min (ref >60)
GFR calc non Af Amer: >60 mL/min (ref >60)

## 2018-10-18 LAB — VITAMIN D 25 HYDROXY (VIT D DEFICIENCY, FRACTURES): Vit D, 25-Hydroxy: 30.82 ng/mL (ref 30–100)

## 2018-10-18 SURGERY — OPEN REDUCTION INTERNAL FIXATION (ORIF) ANKLE FRACTURE
Anesthesia: General | Site: Ankle | Laterality: Right

## 2018-10-18 MED ORDER — PROPOFOL 10 MG/ML IV BOLUS
INTRAVENOUS | Status: DC | PRN
  Administered 2018-10-18: 160 mg via INTRAVENOUS

## 2018-10-18 MED ORDER — HYDROCODONE-ACETAMINOPHEN 5-325 MG PO TABS: 1.0000 | ORAL_TABLET | ORAL | 0 refills | Status: DC | PRN

## 2018-10-18 MED ORDER — RIVAROXABAN 20 MG PO TABS
20.0000 mg | ORAL_TABLET | Freq: Every day | ORAL | Status: DC
  Administered 2018-10-19: 11:00:00 20 mg via ORAL
  Filled 2018-10-18 (×3): qty 1

## 2018-10-18 MED ORDER — METOCLOPRAMIDE HCL 5 MG PO TABS: 5.0000 mg | ORAL_TABLET | Freq: Three times a day (TID) | ORAL | Status: DC | PRN

## 2018-10-18 MED ORDER — LIDOCAINE HCL (CARDIAC) PF 100 MG/5ML IV SOSY: PREFILLED_SYRINGE | INTRAVENOUS | Status: DC | PRN

## 2018-10-18 MED ORDER — VANCOMYCIN HCL IN DEXTROSE 1-5 GM/200ML-% IV SOLN
1000.0000 mg | Freq: Two times a day (BID) | INTRAVENOUS | Status: AC
  Administered 2018-10-18 – 2018-10-19 (×2): 1000 mg via INTRAVENOUS
  Filled 2018-10-18 (×2): qty 200

## 2018-10-18 MED ORDER — ONDANSETRON HCL 4 MG PO TABS: 4.0000 mg | ORAL_TABLET | Freq: Four times a day (QID) | ORAL | Status: DC | PRN

## 2018-10-18 MED ORDER — CLONIDINE HCL (ANALGESIA) 100 MCG/ML EP SOLN
EPIDURAL | Status: DC | PRN
  Administered 2018-10-18: 30 ug
  Administered 2018-10-18: 50 ug

## 2018-10-18 MED ORDER — LIDOCAINE HCL (CARDIAC) PF 100 MG/5ML IV SOSY
PREFILLED_SYRINGE | INTRAVENOUS | Status: DC | PRN
  Administered 2018-10-18: 100 mg via INTRAVENOUS

## 2018-10-18 MED ORDER — PHENYLEPHRINE HCL (PRESSORS) 10 MG/ML IV SOLN
INTRAVENOUS | Status: DC | PRN
  Administered 2018-10-18: 40 ug via INTRAVENOUS
  Administered 2018-10-18: 80 ug via INTRAVENOUS
  Administered 2018-10-18: 40 ug via INTRAVENOUS

## 2018-10-18 MED ORDER — FENTANYL CITRATE (PF) 100 MCG/2ML IJ SOLN
50.0000 ug | Freq: Once | INTRAMUSCULAR | Status: AC
  Administered 2018-10-18: 09:00:00 50 ug via INTRAVENOUS

## 2018-10-18 MED ORDER — SODIUM CHLORIDE 0.9 % IV SOLN
INTRAVENOUS | Status: DC
  Administered 2018-10-18: 08:00:00 via INTRAVENOUS

## 2018-10-18 MED ORDER — METHOCARBAMOL 500 MG PO TABS: 500.0000 mg | ORAL_TABLET | Freq: Four times a day (QID) | ORAL | 0 refills | Status: DC | PRN

## 2018-10-18 MED ORDER — PROPOFOL 10 MG/ML IV BOLUS
INTRAVENOUS | Status: AC
  Filled 2018-10-18: qty 20

## 2018-10-18 MED ORDER — PHENYLEPHRINE 40 MCG/ML (10ML) SYRINGE FOR IV PUSH (FOR BLOOD PRESSURE SUPPORT)
PREFILLED_SYRINGE | INTRAVENOUS | Status: AC
  Filled 2018-10-18: qty 10

## 2018-10-18 MED ORDER — TIOTROPIUM BROMIDE MONOHYDRATE 18 MCG IN CAPS: 18.0000 ug | ORAL_CAPSULE | Freq: Every day | RESPIRATORY_TRACT | Status: DC | PRN

## 2018-10-18 MED ORDER — ONDANSETRON HCL 4 MG/2ML IJ SOLN: 4.0000 mg | Freq: Four times a day (QID) | INTRAMUSCULAR | Status: DC | PRN

## 2018-10-18 MED ORDER — HYDROMORPHONE HCL 1 MG/ML IJ SOLN: 0.2500 mg | INTRAMUSCULAR | Status: DC | PRN

## 2018-10-18 MED ORDER — 0.9 % SODIUM CHLORIDE (POUR BTL) OPTIME
TOPICAL | Status: DC | PRN
  Administered 2018-10-18: 1000 mL

## 2018-10-18 MED ORDER — SODIUM CHLORIDE 0.9 % IV SOLN
INTRAVENOUS | Status: DC
  Administered 2018-10-18: 16:00:00 via INTRAVENOUS

## 2018-10-18 MED ORDER — DEXAMETHASONE SODIUM PHOSPHATE 10 MG/ML IJ SOLN
INTRAMUSCULAR | Status: DC | PRN
  Administered 2018-10-18: 10 mg via INTRAVENOUS

## 2018-10-18 MED ORDER — DEXAMETHASONE SODIUM PHOSPHATE 10 MG/ML IJ SOLN
INTRAMUSCULAR | Status: AC
  Filled 2018-10-18: qty 1

## 2018-10-18 MED ORDER — ROSUVASTATIN CALCIUM 20 MG PO TABS
40.0000 mg | ORAL_TABLET | Freq: Every day | ORAL | Status: DC
  Administered 2018-10-19: 10:00:00 40 mg via ORAL
  Filled 2018-10-18: qty 2

## 2018-10-18 MED ORDER — TOPIRAMATE 100 MG PO TABS
100.0000 mg | ORAL_TABLET | Freq: Every day | ORAL | Status: DC
  Administered 2018-10-19: 10:00:00 100 mg via ORAL
  Filled 2018-10-18: qty 1

## 2018-10-18 MED ORDER — ONDANSETRON HCL 4 MG/2ML IJ SOLN
INTRAMUSCULAR | Status: AC
  Filled 2018-10-18: qty 2

## 2018-10-18 MED ORDER — HYDROCODONE-ACETAMINOPHEN 5-325 MG PO TABS
1.0000 | ORAL_TABLET | Freq: Four times a day (QID) | ORAL | Status: DC | PRN
  Administered 2018-10-19: 10:00:00 1 via ORAL
  Filled 2018-10-18: qty 1
  Filled 2018-10-18: qty 2

## 2018-10-18 MED ORDER — FENTANYL CITRATE (PF) 250 MCG/5ML IJ SOLN
INTRAMUSCULAR | Status: AC
  Filled 2018-10-18: qty 5

## 2018-10-18 MED ORDER — FENTANYL CITRATE (PF) 100 MCG/2ML IJ SOLN
INTRAMUSCULAR | Status: AC
  Administered 2018-10-18: 09:00:00 50 ug via INTRAVENOUS
  Filled 2018-10-18: qty 2

## 2018-10-18 MED ORDER — DEXAMETHASONE SODIUM PHOSPHATE 4 MG/ML IJ SOLN
INTRAMUSCULAR | Status: DC | PRN
  Administered 2018-10-18: 5 mg via INTRAVENOUS
  Administered 2018-10-18: 3 mg via INTRAVENOUS

## 2018-10-18 MED ORDER — METOCLOPRAMIDE HCL 5 MG/ML IJ SOLN: 5.0000 mg | Freq: Three times a day (TID) | INTRAMUSCULAR | Status: DC | PRN

## 2018-10-18 MED ORDER — CHLORHEXIDINE GLUCONATE 4 % EX LIQD: 60.0000 mL | Freq: Once | CUTANEOUS | Status: DC

## 2018-10-18 MED ORDER — MIDAZOLAM HCL 2 MG/2ML IJ SOLN
INTRAMUSCULAR | Status: AC
  Administered 2018-10-18: 09:00:00 2 mg via INTRAVENOUS
  Filled 2018-10-18: qty 2

## 2018-10-18 MED ORDER — VENLAFAXINE HCL ER 150 MG PO CP24
150.0000 mg | ORAL_CAPSULE | Freq: Every day | ORAL | Status: DC
  Administered 2018-10-19: 10:00:00 150 mg via ORAL
  Filled 2018-10-18: qty 1

## 2018-10-18 MED ORDER — VANCOMYCIN HCL 1000 MG IV SOLR
INTRAVENOUS | Status: AC
  Filled 2018-10-18: qty 1000

## 2018-10-18 MED ORDER — ACETAMINOPHEN 325 MG PO TABS
650.0000 mg | ORAL_TABLET | Freq: Four times a day (QID) | ORAL | Status: DC
  Administered 2018-10-18 – 2018-10-19 (×4): 650 mg via ORAL
  Filled 2018-10-18 (×4): qty 2

## 2018-10-18 MED ORDER — MIDAZOLAM HCL 2 MG/2ML IJ SOLN
2.0000 mg | Freq: Once | INTRAMUSCULAR | Status: AC
  Administered 2018-10-18: 09:00:00 2 mg via INTRAVENOUS

## 2018-10-18 MED ORDER — POVIDONE-IODINE 10 % EX SWAB: 2.0000 "application " | Freq: Once | CUTANEOUS | Status: DC

## 2018-10-18 MED ORDER — ONDANSETRON HCL 4 MG/2ML IJ SOLN
INTRAMUSCULAR | Status: DC | PRN
  Administered 2018-10-18: 4 mg via INTRAVENOUS

## 2018-10-18 MED ORDER — ENOXAPARIN SODIUM 40 MG/0.4ML ~~LOC~~ SOLN: 40.0000 mg | SUBCUTANEOUS | Status: DC

## 2018-10-18 MED ORDER — METHOCARBAMOL 500 MG PO TABS: 500.0000 mg | ORAL_TABLET | Freq: Four times a day (QID) | ORAL | Status: DC | PRN

## 2018-10-18 MED ORDER — DOCUSATE SODIUM 100 MG PO CAPS
100.0000 mg | ORAL_CAPSULE | Freq: Two times a day (BID) | ORAL | Status: DC
  Administered 2018-10-18 – 2018-10-19 (×2): 100 mg via ORAL
  Filled 2018-10-18 (×2): qty 1

## 2018-10-18 MED ORDER — NITROGLYCERIN 0.4 MG SL SUBL: 0.4000 mg | SUBLINGUAL_TABLET | SUBLINGUAL | Status: DC | PRN

## 2018-10-18 MED ORDER — PANTOPRAZOLE SODIUM 40 MG PO TBEC
40.0000 mg | DELAYED_RELEASE_TABLET | Freq: Every day | ORAL | Status: DC
  Administered 2018-10-19: 40 mg via ORAL
  Filled 2018-10-18: qty 1

## 2018-10-18 MED ORDER — BUPIVACAINE-EPINEPHRINE (PF) 0.5% -1:200000 IJ SOLN
INTRAMUSCULAR | Status: DC | PRN
  Administered 2018-10-18: 30 mL via PERINEURAL
  Administered 2018-10-18: 20 mL via PERINEURAL

## 2018-10-18 MED ORDER — VALACYCLOVIR HCL 500 MG PO TABS
2000.0000 mg | ORAL_TABLET | Freq: Every day | ORAL | Status: DC
  Administered 2018-10-19: 2000 mg via ORAL
  Filled 2018-10-18: qty 4

## 2018-10-18 MED ORDER — CARVEDILOL 6.25 MG PO TABS
6.2500 mg | ORAL_TABLET | Freq: Every day | ORAL | Status: DC
  Administered 2018-10-19: 10:00:00 6.25 mg via ORAL
  Filled 2018-10-18: qty 1

## 2018-10-18 MED ORDER — VANCOMYCIN HCL 1000 MG IV SOLR
INTRAVENOUS | Status: DC | PRN
  Administered 2018-10-18: 1000 mg via TOPICAL

## 2018-10-18 MED ORDER — METHOCARBAMOL 1000 MG/10ML IJ SOLN
500.0000 mg | Freq: Four times a day (QID) | INTRAVENOUS | Status: DC | PRN
  Filled 2018-10-18: qty 5

## 2018-10-18 MED ORDER — MIDAZOLAM HCL 2 MG/2ML IJ SOLN
INTRAMUSCULAR | Status: AC
  Filled 2018-10-18: qty 2

## 2018-10-18 MED ORDER — ALBUTEROL SULFATE (2.5 MG/3ML) 0.083% IN NEBU: 3.0000 mL | INHALATION_SOLUTION | Freq: Four times a day (QID) | RESPIRATORY_TRACT | Status: DC | PRN

## 2018-10-18 MED ORDER — FENTANYL CITRATE (PF) 100 MCG/2ML IJ SOLN: 50.0000 ug | INTRAMUSCULAR | Status: DC | PRN

## 2018-10-18 MED ORDER — FENTANYL CITRATE (PF) 100 MCG/2ML IJ SOLN
INTRAMUSCULAR | Status: DC | PRN
  Administered 2018-10-18: 50 ug via INTRAVENOUS

## 2018-10-18 MED ORDER — PROMETHAZINE HCL 25 MG/ML IJ SOLN: 6.2500 mg | INTRAMUSCULAR | Status: DC | PRN

## 2018-10-18 MED ORDER — LIDOCAINE 2% (20 MG/ML) 5 ML SYRINGE
INTRAMUSCULAR | Status: AC
  Filled 2018-10-18: qty 5

## 2018-10-18 SURGICAL SUPPLY — 71 items
BANDAGE ESMARK 6X9 LF (GAUZE/BANDAGES/DRESSINGS) ×1 IMPLANT
BIT DRILL 2.5 X LONG (BIT) ×1
BIT DRILL CANN 2.7X625 NONSTRL (BIT) ×1 IMPLANT
BIT DRILL QC 2.5X135 (BIT) ×1 IMPLANT
BIT DRILL X LONG 2.5 (BIT) IMPLANT
BNDG COHESIVE 4X5 TAN STRL (GAUZE/BANDAGES/DRESSINGS) ×2 IMPLANT
BNDG ELASTIC 4X5.8 VLCR STR LF (GAUZE/BANDAGES/DRESSINGS) ×1 IMPLANT
BNDG ELASTIC 6X10 VLCR STRL LF (GAUZE/BANDAGES/DRESSINGS) ×1 IMPLANT
BNDG ELASTIC 6X5.8 VLCR STR LF (GAUZE/BANDAGES/DRESSINGS) IMPLANT
BNDG ESMARK 6X9 LF (GAUZE/BANDAGES/DRESSINGS)
BRUSH SCRUB EZ PLAIN DRY (MISCELLANEOUS) ×4 IMPLANT
CHLORAPREP W/TINT 26 (MISCELLANEOUS) ×2 IMPLANT
COVER SURGICAL LIGHT HANDLE (MISCELLANEOUS) ×2 IMPLANT
DRAPE C-ARM 42X72 X-RAY (DRAPES) ×2 IMPLANT
DRAPE C-ARMOR (DRAPES) ×2 IMPLANT
DRAPE EXTREMITY T 121X128X90 (DISPOSABLE) ×1 IMPLANT
DRAPE ORTHO SPLIT 77X108 STRL (DRAPES) ×2
DRAPE SURG ORHT 6 SPLT 77X108 (DRAPES) ×2 IMPLANT
DRAPE U-SHAPE 47X51 STRL (DRAPES) ×2 IMPLANT
DRILL BIT X LONG 2.5 (BIT) ×1
DRSG ADAPTIC 3X8 NADH LF (GAUZE/BANDAGES/DRESSINGS) ×1 IMPLANT
ELECT REM PT RETURN 9FT ADLT (ELECTROSURGICAL) ×2
ELECTRODE REM PT RTRN 9FT ADLT (ELECTROSURGICAL) ×1 IMPLANT
GAUZE SPONGE 4X4 12PLY STRL (GAUZE/BANDAGES/DRESSINGS) IMPLANT
GAUZE SPONGE 4X4 12PLY STRL LF (GAUZE/BANDAGES/DRESSINGS) ×1 IMPLANT
GLOVE BIO SURGEON STRL SZ 6.5 (GLOVE) ×8 IMPLANT
GLOVE BIO SURGEON STRL SZ7.5 (GLOVE) ×6 IMPLANT
GLOVE BIOGEL PI IND STRL 6.5 (GLOVE) ×1 IMPLANT
GLOVE BIOGEL PI IND STRL 7.5 (GLOVE) ×1 IMPLANT
GLOVE BIOGEL PI INDICATOR 6.5 (GLOVE) ×1
GLOVE BIOGEL PI INDICATOR 7.5 (GLOVE) ×1
GOWN STRL REUS W/ TWL LRG LVL3 (GOWN DISPOSABLE) ×2 IMPLANT
GOWN STRL REUS W/TWL LRG LVL3 (GOWN DISPOSABLE) ×2
KIT TURNOVER KIT B (KITS) ×2 IMPLANT
NDL HYPO 21X1.5 SAFETY (NEEDLE) IMPLANT
NDL HYPO 25GX1X1/2 BEV (NEEDLE) ×1 IMPLANT
NEEDLE HYPO 21X1.5 SAFETY (NEEDLE) IMPLANT
NEEDLE HYPO 25GX1X1/2 BEV (NEEDLE) ×2 IMPLANT
NS IRRIG 1000ML POUR BTL (IV SOLUTION) ×2 IMPLANT
PAD ARMBOARD 7.5X6 YLW CONV (MISCELLANEOUS) ×3 IMPLANT
PAD CAST 4YDX4 CTTN HI CHSV (CAST SUPPLIES) IMPLANT
PADDING CAST COTTON 4X4 STRL (CAST SUPPLIES) ×1
PADDING CAST COTTON 6X4 STRL (CAST SUPPLIES) ×1 IMPLANT
PLATE LCP RECON 3.5 8H/112 (Plate) ×1 IMPLANT
SCREW CANN L THRD/44 4.0 (Screw) ×1 IMPLANT
SCREW CORTEX 3.5 14MM (Screw) ×3 IMPLANT
SCREW CORTEX 3.5 16MM (Screw) ×1 IMPLANT
SCREW CORTEX 3.5 50MM (Screw) ×1 IMPLANT
SCREW CORTEX 3.5 60MM (Screw) ×1 IMPLANT
SCREW HEADED ST 3.5X75 (Screw) ×1 IMPLANT
SCREW HEADED ST 3.5X80 (Screw) ×1 IMPLANT
SCREW LOCK CORT ST 3.5X14 (Screw) IMPLANT
SCREW LOCK CORT ST 3.5X16 (Screw) IMPLANT
SPONGE LAP 18X18 RF (DISPOSABLE) IMPLANT
STAPLER VISISTAT 35W (STAPLE) ×1 IMPLANT
SUCTION FRAZIER HANDLE 10FR (MISCELLANEOUS) ×1
SUCTION FRAZIER TIP 10 FR DISP (SUCTIONS) ×1 IMPLANT
SUCTION TUBE FRAZIER 10FR DISP (MISCELLANEOUS) ×1 IMPLANT
SUT ETHILON 3 0 PS 1 (SUTURE) ×4 IMPLANT
SUT PROLENE 0 CT (SUTURE) IMPLANT
SUT VIC AB 0 CT1 27 (SUTURE)
SUT VIC AB 0 CT1 27XBRD ANBCTR (SUTURE) ×1 IMPLANT
SUT VIC AB 2-0 CT1 27 (SUTURE) ×2
SUT VIC AB 2-0 CT1 TAPERPNT 27 (SUTURE) ×2 IMPLANT
SYR CONTROL 10ML LL (SYRINGE) ×1 IMPLANT
TOWEL GREEN STERILE (TOWEL DISPOSABLE) ×4 IMPLANT
TOWEL GREEN STERILE FF (TOWEL DISPOSABLE) ×2 IMPLANT
TUBE CONNECTING 12X1/4 (SUCTIONS) ×1 IMPLANT
UNDERPAD 30X30 (UNDERPADS AND DIAPERS) ×2 IMPLANT
WATER STERILE IRR 1000ML POUR (IV SOLUTION) ×2 IMPLANT
YANKAUER SUCT BULB TIP NO VENT (SUCTIONS) ×1 IMPLANT

## 2018-10-18 NOTE — Discharge Instructions (Addendum)
Orthopaedic Trauma Service Discharge Instructions   General Discharge Instructions  WEIGHT BEARING STATUS: Non-weightbearing on right leg.  RANGE OF MOTION/ACTIVITY: Unrestricted knee range of motion. Wiggle toes as much as possible  Wound Care: Do not remove splint or get splint wet.   DVT/PE prophylaxis: Continue previously prescribed Lovenox x 2 days. Start back on home dose of Xarelto starting on POD #1 (Saturday 10/19/2018)  Diet: as you were eating previously.  Can use over the counter stool softeners and bowel preparations, such as Miralax, to help with bowel movements.  Narcotics can be constipating.  Be sure to drink plenty of fluids  PAIN MEDICATION USE AND EXPECTATIONS  You have likely been given narcotic medications to help control your pain.  After a traumatic event that results in an fracture (broken bone) with or without surgery, it is ok to use narcotic pain medications to help control one's pain.  We understand that everyone responds to pain differently and each individual patient will be evaluated on a regular basis for the continued need for narcotic medications. Ideally, narcotic medication use should last no more than 6-8 weeks (coinciding with fracture healing).   As a patient it is your responsibility as well to monitor narcotic medication use and report the amount and frequency you use these medications when you come to your office visit.   We would also advise that if you are using narcotic medications, you should take a dose prior to therapy to maximize you participation.  IF YOU ARE ON NARCOTIC MEDICATIONS IT IS NOT PERMISSIBLE TO OPERATE A MOTOR VEHICLE (MOTORCYCLE/CAR/TRUCK/MOPED) OR HEAVY MACHINERY DO NOT MIX NARCOTICS WITH OTHER CNS (CENTRAL NERVOUS SYSTEM) DEPRESSANTS SUCH AS ALCOHOL   STOP SMOKING OR USING NICOTINE PRODUCTS!!!!  As discussed nicotine severely impairs your body's ability to heal surgical and traumatic wounds but also impairs bone healing.   Wounds and bone heal by forming microscopic blood vessels (angiogenesis) and nicotine is a vasoconstrictor (essentially, shrinks blood vessels).  Therefore, if vasoconstriction occurs to these microscopic blood vessels they essentially disappear and are unable to deliver necessary nutrients to the healing tissue.  This is one modifiable factor that you can do to dramatically increase your chances of healing your injury.    (This means no smoking, no nicotine gum, patches, etc)  DO NOT USE NONSTEROIDAL ANTI-INFLAMMATORY DRUGS (NSAID'S)  Using products such as Advil (ibuprofen), Aleve (naproxen), Motrin (ibuprofen) for additional pain control during fracture healing can delay and/or prevent the healing response.  If you would like to take over the counter (OTC) medication, Tylenol (acetaminophen) is ok.  However, some narcotic medications that are given for pain control contain acetaminophen as well. Therefore, you should not exceed more than 4000 mg of tylenol in a day if you do not have liver disease.  Also note that there are may OTC medicines, such as cold medicines and allergy medicines that my contain tylenol as well.  If you have any questions about medications and/or interactions please ask your doctor/PA or your pharmacist.      ICE AND ELEVATE INJURED/OPERATIVE EXTREMITY  Using ice and elevating the injured extremity above your heart can help with swelling and pain control.  Icing in a pulsatile fashion, such as 20 minutes on and 20 minutes off, can be followed.    Do not place ice directly on skin. Make sure there is a barrier between to skin and the ice pack.    Using frozen items such as frozen peas works well as  the conform nicely to the are that needs to be iced.  USE AN ACE WRAP OR TED HOSE FOR SWELLING CONTROL  In addition to icing and elevation, Ace wraps or TED hose are used to help limit and resolve swelling.  It is recommended to use Ace wraps or TED hose until you are informed to  stop.    When using Ace Wraps start the wrapping distally (farthest away from the body) and wrap proximally (closer to the body)   Example: If you had surgery on your leg or thing and you do not have a splint on, start the ace wrap at the toes and work your way up to the thigh        If you had surgery on your upper extremity and do not have a splint on, start the ace wrap at your fingers and work your way up to the upper arm  IF YOU ARE IN A SPLINT OR CAST DO NOT [place]   If your splint gets wet for any reason please contact the office immediately. You may shower in your splint or cast as long as you keep it dry.  This can be done by wrapping in a cast cover or garbage back (or similar)  Do Not stick any thing down your splint or cast such as pencils, money, or hangers to try and scratch yourself with.  If you feel itchy take benadryl as prescribed on the bottle for itching    CALL THE OFFICE WITH ANY QUESTIONS OR CONCERNS: [phone]   VISIT OUR WEBSITE FOR ADDITIONAL INFORMATION: [email or web address]

## 2018-10-18 NOTE — Plan of Care (Signed)

## 2018-10-18 NOTE — Anesthesia Procedure Notes (Signed)
Anesthesia Regional Block: Adductor canal block   Pre-Anesthetic Checklist: ,, timeout performed, Correct Patient, Correct Site, Correct Laterality, Correct Procedure, Correct Position, site marked, Risks and benefits discussed,  Surgical consent,  Pre-op evaluation,  At surgeon's request and post-op pain management  Laterality: Right  Prep: chloraprep       Needles:  Injection technique: Single-shot  Needle Type: Stimiplex     Needle Length: 9cm  Needle Gauge: 21     Additional Needles:   Procedures:,,,, ultrasound used (permanent image in chart),,,,  Narrative:  Start time: 10/18/2018 8:53 AM End time: 10/18/2018 8:59 AM Injection made incrementally with aspirations every 5 mL.  Performed by: Personally  Anesthesiologist: Nolon Nations, MD  Additional Notes: BP cuff, EKG monitors applied. Sedation begun. Artery and nerve location verified with U/S and anesthetic injected incrementally, slowly, and after negative aspirations under direct u/s guidance. Good fascial /perineural spread. Tolerated well.

## 2018-10-18 NOTE — Anesthesia Procedure Notes (Signed)
Procedure Name: LMA Insertion Date/Time: 10/18/2018 9:39 AM Performed by: Jenne Campus, CRNA Pre-anesthesia Checklist: Patient identified, Emergency Drugs available, Suction available and Patient being monitored Patient Re-evaluated:Patient Re-evaluated prior to induction Oxygen Delivery Method: Circle System Utilized Preoxygenation: Pre-oxygenation with 100% oxygen Induction Type: IV induction Ventilation: Mask ventilation without difficulty LMA: LMA inserted LMA Size: 4.0 Number of attempts: 1 Airway Equipment and Method: Bite block Placement Confirmation: positive ETCO2 and breath sounds checked- equal and bilateral Tube secured with: Tape Dental Injury: Teeth and Oropharynx as per pre-operative assessment

## 2018-10-18 NOTE — Progress Notes (Signed)
Patient suffers from ORIF of R ankle, NWB which impairs their ability to perform daily activities like ambulation, ADLs in the home.  A walker alone will not resolve the issues with performing activities of daily living. A wheelchair with elevating leg rests will allow patient to safely perform daily activities.  The patient can self propel in the home or has a caregiver who can provide assistance.     Leighton Ruff, PT, DPT  Acute Rehabilitation Services  Pager: [phone] Office: [phone]

## 2018-10-18 NOTE — Progress Notes (Signed)
Lab stated that CBC had clotted.  Dr. Lissa Hoard and Dr. Doreatha Martin aware.  Dr. Lissa Hoard ordered an ISTAT.

## 2018-10-18 NOTE — Transfer of Care (Signed)
Immediate Anesthesia Transfer of Care Note  Patient: Consepcion Utt  Procedure(s) Performed: OPEN REDUCTION INTERNAL FIXATION (ORIF) ANKLE FRACTURE (Right Ankle)  Patient Location: PACU  Anesthesia Type:General  Level of Consciousness: awake, oriented and patient cooperative  Airway & Oxygen Therapy: Patient Spontanous Breathing and Patient connected to nasal cannula oxygen  Post-op Assessment: Report given to RN and Post -op Vital signs reviewed and stable  Post vital signs: Reviewed  Last Vitals:  Vitals Value Taken Time  BP 109/75 10/18/18 1120  Temp    Pulse 77 10/18/18 1120  Resp 13 10/18/18 1120  SpO2 93 % 10/18/18 1120  Vitals shown include unvalidated device data.  Last Pain:  Vitals:   10/18/18 0905  PainSc: 0-No pain      Patients Stated Pain Goal: 2 (15/40/08 6761)  Complications: No apparent anesthesia complications

## 2018-10-18 NOTE — Anesthesia Procedure Notes (Signed)
Anesthesia Regional Block: Popliteal block   Pre-Anesthetic Checklist: ,, timeout performed, Correct Patient, Correct Site, Correct Laterality, Correct Procedure, Correct Position, site marked, Risks and benefits discussed,  Surgical consent,  Pre-op evaluation,  At surgeon's request and post-op pain management  Laterality: Right  Prep: chloraprep       Needles:  Injection technique: Single-shot  Needle Type: Stimiplex     Needle Length: 10cm  Needle Gauge: 21     Additional Needles:   Procedures:,,,, ultrasound used (permanent image in chart),,,,  Motor weakness within 5 minutes.  Narrative:  Start time: 10/18/2018 8:59 AM End time: 10/18/2018 9:05 AM Injection made incrementally with aspirations every 5 mL.  Performed by: Personally  Anesthesiologist: Nolon Nations, MD  Additional Notes: Nerve located and needle positioned with direct ultrasound guidance. Good perineural spread. Patient tolerated well.

## 2018-10-18 NOTE — Progress Notes (Signed)
Pharmacy Antibiotic Note  Lindsay Bradford is a 58 y.o. female admitted on 10/18/2018 for orthopedic surgery.  Post op Pharmacy has been consulted for Vancomycin dosing for surgical prophylaxis.  S/p ORIF right ankle fracture 10/2/2- Preop vanc 1500mg  IV x1 given 10/2 at 0809 Wt 104 kg, SCr 0.6, crcl 97 Afebrile Clarified with Patrecia Pace, PA-C ,wants to give 2 doses of vancomycin post op.   Patient is allergic to PCN and Keflex.   Plan: Vancomycin 1000 mg IV q12h x2 doses.  Pharmacy will sign off, please re-consult pharmacy if need to continue vancomycin beyond 2 doses.   Height: 5\' 7"  (170.2 cm) Weight: 230 lb (104.3 kg) IBW/kg (Calculated) : 61.6  Temp (24hrs), Avg:98 F (36.7 C), Min:97.6 F (36.4 C), Max:98.4 F (36.9 C)  Recent Labs  Lab 10/18/18 0808 10/18/18 0914  CREATININE 0.76 0.60    Estimated Creatinine Clearance: 97.6 mL/min (by C-G formula based on SCr of 0.6 mg/dL).    Allergies  Allergen Reactions  . Cephalexin Hives  . Clarithromycin Hives  . Ibuprofen Hives  . Other Itching and Other (See Comments)    Whole milk and oranges  . Oxymorphone Palpitations  . Penicillins Anaphylaxis    Did it involve swelling of the face/tongue/throat, SOB, or low BP? Yes Did it involve sudden or severe rash/hives, skin peeling, or any reaction on the inside of your mouth or nose? Yes Did you need to seek medical attention at a hospital or doctor's office? Yes When did it last happen?childhood allergy If all above answers are "NO", may proceed with cephalosporin use.   [name] Potassium Chloride Hives    Reacts to oral KCl, tolerates it slow IV   . Sulfa Antibiotics Anaphylaxis  . Clindamycin Hives and Itching  . Fentanyl Hives and Other (See Comments)    Fentanyl patch, it breaks pt out in hives and lowers her blood pressure.   . Naproxen Hives  . Iodinated Diagnostic Agents Hives  . Tetracycline Hives and Itching   Thank you for allowing pharmacy to be a part of  this patient's care. Nicole Cella, RPh Clinical Pharmacist Please check AMION for all [place] phone numbers After 10:00 PM, call [place] [phone]  10/18/2018 3:27 PM

## 2018-10-18 NOTE — Interval H&P Note (Signed)
History and Physical Interval Note:  10/18/2018 9:05 AM  Lindsay Bradford  has presented today for surgery, with the diagnosis of Right trimalleolar ankle fracture.  The various methods of treatment have been discussed with the patient and family. After consideration of risks, benefits and other options for treatment, the patient has consented to  Procedure(s): OPEN REDUCTION INTERNAL FIXATION (ORIF) ANKLE FRACTURE (Right) as a surgical intervention.  The patient's history has been reviewed, patient examined, no change in status, stable for surgery.  I have reviewed the patient's chart and labs.  Questions were answered to the patient's satisfaction.     Lennette Bihari P Ieshia Hatcher

## 2018-10-18 NOTE — Evaluation (Signed)
Physical Therapy Evaluation Patient Details Name: Lindsay Bradford MRN: [id number] DOB: Nov 11, 1960 Today's Date: 10/18/2018   History of Present Illness  Pt is a 58 y/o female s/p ORIF of R ankle fx. PMH includes COPD, asthma, HTN, CVA, DVT, and fibromyalgia.   Clinical Impression  Pt is s/p surgery above with deficits below. Pt unable to take hop steps this session, but was able to perform lateral shuffle type steps using LLE and RW. Required min A for mobility tasks. Feel pt will require WC at home to increase safety with mobility. Reports son can assist as needed. Will need to ensure safety with stair management prior to d/c home. Will continue to follow acutely to maximize functional mobility independence and safety.     Follow Up Recommendations Home health PT;Supervision for mobility/OOB    Equipment Recommendations  Wheelchair (measurements PT);Wheelchair cushion (measurements PT);3in1 (PT);Other (comment)(tub bench)    Recommendations for Other Services OT consult     Precautions / Restrictions Precautions Precautions: Fall Restrictions Weight Bearing Restrictions: Yes RLE Weight Bearing: Non weight bearing      Mobility  Bed Mobility Overal bed mobility: Modified Independent                Transfers Overall transfer level: Needs assistance Equipment used: Rolling walker (2 wheeled) Transfers: Sit to/from Stand Sit to Stand: Min assist         General transfer comment: Min A for lift assist and steadying. Cues for safe hand placement. Cues to kick out RLE secondary to pain.   Ambulation/Gait             General Gait Details: Attempted to take hop steps, however, pt unable secondary to bursitis in hip. Pt was able to perform lateral shuffle type steps, but unable to clear LLE off ground.   Stairs            Wheelchair Mobility    Modified Rankin (Stroke Patients Only)       Balance Overall balance assessment: Needs  assistance Sitting-balance support: No upper extremity supported;Feet supported Sitting balance-Leahy Scale: Good     Standing balance support: Bilateral upper extremity supported;During functional activity Standing balance-Leahy Scale: Poor Standing balance comment: Reliant on BUE support                              Pertinent Vitals/Pain Pain Assessment: No/denies pain    Home Living Family/patient expects to be discharged to:: Private residence Living Arrangements: Children Available Help at Discharge: Family;Available 24 hours/day Type of Home: House Home Access: Stairs to enter Entrance Stairs-Rails: None Entrance Stairs-Number of Steps: 3 Home Layout: One level Home Equipment: None      Prior Function Level of Independence: Independent         Comments: Reports she was independent prior to fall. Since fall she reports she has been "scooting" around, however, did not elaborate.      Hand Dominance        Extremity/Trunk Assessment   Upper Extremity Assessment Upper Extremity Assessment: Defer to OT evaluation    Lower Extremity Assessment Lower Extremity Assessment: RLE deficits/detail RLE Deficits / Details: Pt reports numbness in R ankle. Deficits consistent with post op pain and weakness. Was able to perform SLR.     Cervical / Trunk Assessment Cervical / Trunk Assessment: Normal  Communication   Communication: No difficulties  Cognition Arousal/Alertness: Awake/alert Behavior During Therapy: WFL for tasks assessed/performed Overall Cognitive Status:  Within Functional Limits for tasks assessed                                        General Comments General comments (skin integrity, edema, etc.): Pt reports she plans to return home at d/c. Educated about WC use to increase safety.     Exercises     Assessment/Plan    PT Assessment Patient needs continued PT services  PT Problem List Decreased strength;Decreased  balance;Decreased mobility;Decreased knowledge of precautions;Decreased knowledge of use of DME       PT Treatment Interventions Gait training;Functional mobility training;Stair training;Therapeutic activities;Therapeutic exercise;Balance training;Patient/family education;Wheelchair mobility training    PT Goals (Current goals can be found in the Care Plan section)  Acute Rehab PT Goals Patient Stated Goal: to get a WC for home PT Goal Formulation: With patient Time For Goal Achievement: 11/01/18 Potential to Achieve Goals: Good    Frequency Min 5X/week   Barriers to discharge Inaccessible home environment      Co-evaluation               AM-PAC PT "6 Clicks" Mobility  Outcome Measure Help needed turning from your back to your side while in a flat bed without using bedrails?: None Help needed moving from lying on your back to sitting on the side of a flat bed without using bedrails?: None Help needed moving to and from a bed to a chair (including a wheelchair)?: A Little Help needed standing up from a chair using your arms (e.g., wheelchair or bedside chair)?: A Little Help needed to walk in hospital room?: Total Help needed climbing 3-5 steps with a railing? : Total 6 Click Score: 16    End of Session Equipment Utilized During Treatment: Gait belt Activity Tolerance: Patient tolerated treatment well Patient left: in bed;with call bell/phone within reach Nurse Communication: Mobility status;Other (comment)(pt reports IV is burning. ) PT Visit Diagnosis: Difficulty in walking, not elsewhere classified (R26.2);Muscle weakness (generalized) (M62.81);Unsteadiness on feet (R26.81);History of falling (Z91.81)    Time: 9678-9381 PT Time Calculation (min) (ACUTE ONLY): 18 min   Charges:   PT Evaluation $PT Eval Low Complexity: Prentiss, PT, DPT  Acute Rehabilitation Services  Pager: [phone] Office: [phone]   Rudean Hitt 10/18/2018, 4:49 PM

## 2018-10-18 NOTE — Op Note (Signed)
Orthopaedic Surgery Operative Note (CSN: [id number] ) Date of Surgery: 10/18/2018  Admit Date: 10/18/2018   Diagnoses: Pre-Op Diagnoses: Right trimalleolar ankle fracture  Post-Op Diagnosis: Same  Procedures: CPT 27823-Open reduction internal fixation of right trimalleolar ankle fracture  Surgeons : Primary: Shona Needles, MD  Assistant: Patrecia Pace, PA-C  Location: OR 6   Anesthesia:General  Antibiotics: Vancomycin 1500mg  preop   Tourniquet time: Total Tourniquet Time Documented: Thigh (Right) - 54 minutes Total: Thigh (Right) - 54 minutes  Estimated Blood YTKZ:60 mL  Complications:None   Specimens:None   Implants: Implant Name Type Inv. Item Serial No. Manufacturer Lot No. LRB No. Used Action  75 x 3.5 lp screw    [employer]  Right 1 Implanted  80 x 3.5 lp screw Screw   [employer]  Right 1 Implanted  SCREW CORTEX 3.5 14MM - [id number] Screw SCREW CORTEX 3.5 14MM  [employer]  Right 3 Implanted  SCREW CORTEX 3.5 16MM - [id number] Screw SCREW CORTEX 3.5 16MM  [employer]  Right 1 Implanted  SCREW CORTEX 3.5 50MM - [id number] Screw SCREW CORTEX 3.5 50MM  [employer]  Right 1 Implanted  SCREW CORTEX 3.5 60MM - [id number] Screw SCREW CORTEX 3.5 60MM  [employer]  Right 1 Implanted  PLATE RECONSTRUCTION - [id number] Plate PLATE RECONSTRUCTION  [employer]  Right 1 Implanted  SCREW LONG THREAD 4.0 - [id number] Screw SCREW LONG THREAD 4.0  [employer]  Right 1 Implanted     Indications for Surgery: 58 year old female who sustained a ground-level fall and had a trimalleolar ankle fracture on the right side.  She has a history of cardiac disease with a factor V Leiden and a history of strokes.  Due to the displacement and unstable nature of her ankle injury I recommended proceeding with open reduction internal fixation.  Risks and benefits were discussed with the patient.  Risks included but not limited to bleeding, infection, malunion, nonunion, hardware  failure, hardware irritation, need for hardware removal, posttraumatic arthritis, ankle stiffness, nerve and blood vessel injury, DVT, even the possibility anesthetic complications.  She agreed to proceed with surgery and consent was obtained.  Operative Findings: 1.  Open reduction internal fixation of right trimalleolar ankle fracture using Synthes 8 hole recon plate for the fibula and Synthes 3.5 mm low-profile screws for the medial malleolus. 2.  Syndesmotic fixation provided with 3.5 mm quadricortical screws through the plate. 3.  Independent fixation of posterior malleolus using anterior posterior 4.0 mm cannulated screw.  Procedure: The patient was identified in the preoperative holding area. Consent was confirmed with the patient and their family and all questions were answered. The operative extremity was marked after confirmation with the patient. she was then brought back to the operating room by our anesthesia colleagues.  She was carefully transferred to a radiolucent flat top table.  She was placed under general anesthetic.  A bump was placed in her operative hip.  A nonsterile tourniquet was placed to her upper thigh. The operative extremity was then prepped and draped in usual sterile fashion. A preoperative timeout was performed to verify the patient, the procedure, and the extremity. Preoperative antibiotics were dosed.  Fluoroscopic images were used to identify the unstable nature of her injury.  The tourniquet was inflated to 300 mmHg.  I made a direct lateral approach to the fibula.  Carried down through skin and subcutaneous tissue.  I performed a careful dissection to protect the superficial peroneal nerve.  Identified the fracture mobilized it  and clamped it anatomic.  Fluoroscopic imaging was used to confirm adequate reduction.  I then contoured an 8 hole Synthes recon plate and I placed nonlocking screw proximal and distal to the fracture to hold the reduction.  I placed 2 more  nonlocking screws in the proximal shaft segment.  I left the distal 2 screw holes free to placed syndesmotic screws after medial malleolus fixation.  Her medial malleolus was anatomically aligned after fixation of the fibula and with her moderate swelling and history of anticoagulation I felt that limited approach would be optimal.  As result a percutaneous incisions were made distal to the medial malleolus.  Under fluoroscopy I then drilled and placed 3.5 mm low-profile screws gaining bicortical fixation in the tibia and fixing the medial malleolus.  I then provide medial compression of the fibula and full ankle dorsiflexion to place syndesmotic screws across the fibula and tibia.  Two screws were placed.  Excellent fixation was obtained.  I then identified the lateral view posterior malleolus fracture and that did not have any subluxation however I felt that fixation would be appropriate to the size.  Percutaneous incisions were made anterior lateral and posterior lateral to place a reduction tenaculum to reduce the fracture.  I then used a K wire from the 4.0 mm cannulated screw set directed anterior to posterior I measured this and placed a partially-threaded 4.0 millimeter screw.  The clamp was removed and final fluoroscopic images were obtained.  The incision was copiously irrigated.  A gram of vancomycin powder was placed into the incision.  A layered closure of 2-0 Vicryl and 3-0 nylon was used.  Sterile dressing consisting of bacitracin ointment, Adaptic, 4 x 4 sterile cast padding and a well-padded short leg splint was placed.  The tourniquet was deflated.  The patient was awoken from anesthesia taken the PACU in stable condition.  Post Op Plan/Instructions: Patient will be nonweightbearing to the right lower extremity.  She will receive Lovenox for DVT prophylaxis as a bridge as she restarts her direct thrombin inhibitor tomorrow.  She will be admitted overnight for observation we will give her  antibiotics.  I was present and performed the entire surgery.  Ulyses Southward, PA-C did assist me throughout the case. An assistant was necessary given the difficulty in approach, maintenance of reduction and ability to instrument the fracture.   Truitt Merle, MD [facility]

## 2018-10-19 DIAGNOSIS — F17200 Nicotine dependence, unspecified, uncomplicated: Secondary | ICD-10-CM | POA: Diagnosis not present

## 2018-10-19 DIAGNOSIS — J449 Chronic obstructive pulmonary disease, unspecified: Secondary | ICD-10-CM | POA: Diagnosis not present

## 2018-10-19 DIAGNOSIS — R519 Headache, unspecified: Secondary | ICD-10-CM | POA: Diagnosis not present

## 2018-10-19 DIAGNOSIS — I2510 Atherosclerotic heart disease of native coronary artery without angina pectoris: Secondary | ICD-10-CM | POA: Diagnosis not present

## 2018-10-19 DIAGNOSIS — F329 Major depressive disorder, single episode, unspecified: Secondary | ICD-10-CM | POA: Diagnosis not present

## 2018-10-19 DIAGNOSIS — S82851A Displaced trimalleolar fracture of right lower leg, initial encounter for closed fracture: Secondary | ICD-10-CM | POA: Diagnosis not present

## 2018-10-19 DIAGNOSIS — Z23 Encounter for immunization: Secondary | ICD-10-CM | POA: Diagnosis not present

## 2018-10-19 DIAGNOSIS — I252 Old myocardial infarction: Secondary | ICD-10-CM | POA: Diagnosis not present

## 2018-10-19 DIAGNOSIS — I739 Peripheral vascular disease, unspecified: Secondary | ICD-10-CM | POA: Diagnosis not present

## 2018-10-19 LAB — BASIC METABOLIC PANEL
Anion gap: 8 (ref 5–15)
BUN: 8 mg/dL (ref 6–20)
CO2: 23 mmol/L (ref 22–32)
Calcium: 8.4 mg/dL — ABNORMAL LOW (ref 8.9–10.3)
Chloride: 111 mmol/L (ref 98–111)
Creatinine, Ser: 0.62 mg/dL (ref 0.44–1.00)
GFR calc Af Amer: >60 mL/min (ref >60)
GFR calc non Af Amer: >60 mL/min (ref >60)
Glucose, Bld: 132 mg/dL — ABNORMAL HIGH (ref 70–99)
Potassium: 4.2 mmol/L (ref 3.5–5.1)
Sodium: 142 mmol/L (ref 135–145)

## 2018-10-19 LAB — CBC
HCT: 37.9 % (ref 36.0–46.0)
Hemoglobin: 12.6 g/dL (ref 12.0–15.0)
MCH: 32.1 pg (ref 26.0–34.0)
MCHC: 33.2 g/dL (ref 30.0–36.0)
MCV: 96.4 fL (ref 80.0–100.0)
Platelets: 245 10*3/uL (ref 150–400)
RBC: 3.93 MIL/uL (ref 3.87–5.11)
RDW: 14.4 % (ref 11.5–15.5)
WBC: 13 10*3/uL — ABNORMAL HIGH (ref 4.0–10.5)
nRBC: 0 % (ref 0.0–0.2)

## 2018-10-19 MED ORDER — INFLUENZA VAC SPLIT QUAD 0.5 ML IM SUSY
0.5000 mL | PREFILLED_SYRINGE | Freq: Once | INTRAMUSCULAR | Status: AC
  Administered 2018-10-19: 17:00:00 0.5 mL via INTRAMUSCULAR

## 2018-10-19 MED ORDER — ENOXAPARIN SODIUM 40 MG/0.4ML ~~LOC~~ SOLN
40.0000 mg | SUBCUTANEOUS | Status: DC
  Administered 2018-10-19: 10:00:00 40 mg via SUBCUTANEOUS
  Filled 2018-10-19: qty 0.4

## 2018-10-19 NOTE — Progress Notes (Signed)
Physical Therapy Treatment Patient Details Name: Lindsay Bradford MRN: [id number] DOB: [birth year] Today's Date: 10/19/2018    History of Present Illness Pt is a 58 y/o female s/p ORIF of R ankle fx. PMH includes COPD, asthma, HTN, CVA, DVT, and fibromyalgia.     PT Comments    Pt suipine in bed post OT session and denies need for OOB mobility.  Session focused on LE HEP.  HEP issued post session.  Educated on technique and frequency.  Plan for HHPT remains appropriate.  Pt also denied stair training and reports ramped entrance.      Follow Up Recommendations  Home health PT;Supervision for mobility/OOB     Equipment Recommendations  Wheelchair (measurements PT);Wheelchair cushion (measurements PT);3in1 (PT);Other (comment)(tub bench)    Recommendations for Other Services OT consult     Precautions / Restrictions Precautions Precautions: Fall Restrictions Weight Bearing Restrictions: Yes RLE Weight Bearing: Non weight bearing    Mobility  Bed Mobility Overal bed mobility: Modified Independent             General bed mobility comments: For repositioning in bed.  Transfers                    Ambulation/Gait                 Stairs             Wheelchair Mobility    Modified Rankin (Stroke Patients Only)       Balance Overall balance assessment: Needs assistance Sitting-balance support: No upper extremity supported;Feet supported Sitting balance-Leahy Scale: Good       Standing balance-Leahy Scale: Fair                              Cognition Arousal/Alertness: Awake/alert Behavior During Therapy: WFL for tasks assessed/performed Overall Cognitive Status: Within Functional Limits for tasks assessed                                        Exercises General Exercises - Lower Extremity Ankle Circles/Pumps: AROM;Left;20 reps;Supine(unable to wiggle toes on the R side.) Quad Sets: AROM;Both;10  reps;Supine Heel Slides: AROM;Both;10 reps;Supine Hip ABduction/ADduction: AROM;10 reps;Supine Straight Leg Raises: AROM;Both;10 reps;Supine    General Comments        Pertinent Vitals/Pain Pain Assessment: No/denies pain    Home Living Family/patient expects to be discharged to:: Private residence Living Arrangements: Children                  Prior Function            PT Goals (current goals can now be found in the care plan section) Acute Rehab PT Goals Patient Stated Goal: to go home today Potential to Achieve Goals: Good Progress towards PT goals: Progressing toward goals    Frequency    Min 5X/week      PT Plan Current plan remains appropriate    Co-evaluation              AM-PAC PT "6 Clicks" Mobility   Outcome Measure  Help needed turning from your back to your side while in a flat bed without using bedrails?: None Help needed moving from lying on your back to sitting on the side of a flat bed without using bedrails?: None Help needed moving to and from a bed  to a chair (including a wheelchair)?: A Little Help needed standing up from a chair using your arms (e.g., wheelchair or bedside chair)?: A Little Help needed to walk in hospital room?: Total Help needed climbing 3-5 steps with a railing? : Total 6 Click Score: 16    End of Session Equipment Utilized During Treatment: Gait belt Activity Tolerance: Patient tolerated treatment well Patient left: in bed;with call bell/phone within reach Nurse Communication: Mobility status;Other (comment) PT Visit Diagnosis: Difficulty in walking, not elsewhere classified (R26.2);Muscle weakness (generalized) (M62.81);Unsteadiness on feet (R26.81);History of falling (Z91.81)     Time: 4627-0350 PT Time Calculation (min) (ACUTE ONLY): 16 min  Charges:  $Therapeutic Exercise: 8-22 mins                     Governor Rooks, PTA Acute Rehabilitation Services Pager [phone] Office  [phone]     Illona Bulman Eli Hose 10/19/2018, 3:48 PM

## 2018-10-19 NOTE — Evaluation (Signed)
Occupational Therapy Evaluation Patient Details Name: Lindsay Bradford MRN: [id number] DOB: 12-Aug-1960 Today's Date: 10/19/2018    History of Present Illness Pt is a 58 y/o female s/p ORIF of R ankle fx. PMH includes COPD, asthma, HTN, CVA, DVT, and fibromyalgia.    Clinical Impression   Patient evaluated by Occupational Therapy with no further acute OT needs identified. All education has been completed and the patient has no further questions. See below for any follow-up Occupational Therapy or equipment needs. OT to sign off. Thank you for referral.   Pt states "yall are excellent. You dont get told enough"    Follow Up Recommendations  No OT follow up    Equipment Recommendations  3 in 1 bedside commode;Wheelchair (measurements OT);Wheelchair cushion (measurements OT)    Recommendations for Other Services       Precautions / Restrictions Precautions Precautions: Fall Restrictions Weight Bearing Restrictions: Yes RLE Weight Bearing: Non weight bearing      Mobility Bed Mobility Overal bed mobility: Modified Independent                Transfers Overall transfer level: Modified independent               General transfer comment: stand pivot to w/c and back    Balance           Standing balance support: Bilateral upper extremity supported;During functional activity Standing balance-Leahy Scale: Fair Standing balance comment: reliant for static standing task however plans to complete adls seated from bed level                            ADL either performed or assessed with clinical judgement   ADL Overall ADL's : Modified independent                                       General ADL Comments: pt able to complete figure 4 cross and touch foot on R LE. pt reports only sensation is big toe otherwise numb. pt is able to demonstrate transfer w/c to bsc and then back to w/c to bed surface. pt plans to keep all needed items in  bedroom with son and grandson empty the [facility]. pt completed transfer without RW and states I dont need it and able to complete stand pivot with NWB R LE.      Vision Baseline Vision/History: Wears glasses Wears Glasses: At all times       Perception     Praxis      Pertinent Vitals/Pain Pain Assessment: No/denies pain     Hand Dominance Right   Extremity/Trunk Assessment Upper Extremity Assessment Upper Extremity Assessment: Overall WFL for tasks assessed   Lower Extremity Assessment Lower Extremity Assessment: Defer to PT evaluation   Cervical / Trunk Assessment Cervical / Trunk Assessment: Normal   Communication Communication Communication: No difficulties   Cognition Arousal/Alertness: Awake/alert Behavior During Therapy: WFL for tasks assessed/performed Overall Cognitive Status: Within Functional Limits for tasks assessed                                     General Comments  pt with good return demo R LE elevation and has bed setup     Exercises     Shoulder Instructions  Home Living Family/patient expects to be discharged to:: Private residence Living Arrangements: Children Available Help at Discharge: Family;Available 24 hours/day Type of Home: House Home Access: Stairs to enter [employer] of Steps: 3 Entrance Stairs-Rails: None Home Layout: One level     Bathroom Shower/Tub: Teacher, early years/pre: Standard     Home Equipment: None          Prior Functioning/Environment Level of Independence: Independent                 OT Problem List:        OT Treatment/Interventions:      OT Goals(Current goals can be found in the care plan section) Acute Rehab OT Goals Patient Stated Goal: to go home today Potential to Achieve Goals: Good  OT Frequency:     Barriers to D/C:            Co-evaluation              AM-PAC OT "6 Clicks" Daily Activity     Outcome Measure Help from another  person eating meals?: None Help from another person taking care of personal grooming?: None Help from another person toileting, which includes using toliet, bedpan, or urinal?: None Help from another person bathing (including washing, rinsing, drying)?: None Help from another person to put on and taking off regular upper body clothing?: None Help from another person to put on and taking off regular lower body clothing?: None 6 Click Score: 24   End of Session Nurse Communication: Mobility status;Precautions;Weight bearing status  Activity Tolerance: Patient tolerated treatment well Patient left: in bed;with call bell/phone within reach;with bed alarm set  OT Visit Diagnosis: Unsteadiness on feet (R26.81)                Time: 4174-0814 OT Time Calculation (min): 18 min Charges:  OT General Charges $OT Visit: 1 Visit OT Evaluation $OT Eval Moderate Complexity: 1 Mod   Jeri Modena, OTR/L  Acute Rehabilitation Services Pager: [phone] Office: [phone] .   Jeri Modena 10/19/2018, 10:34 AM

## 2018-10-19 NOTE — Discharge Summary (Signed)
Discharge Summary  Patient ID: Lindsay Bradford MRN: [id number] DOB/AGE: 56-Oct-1962 56 y.o.  Admit date: 10/18/2018 Discharge date: 10/19/2018  Admission Diagnoses:  Closed displaced trimalleolar fracture of right lower leg  Discharge Diagnoses:  Principal Problem:   Closed displaced trimalleolar fracture of right lower leg Active Problems:   Closed trimalleolar fracture of ankle, right, initial encounter   Past Medical History:  Diagnosis Date  . Arthritis   . Asthma   . Bursitis of left hip   . COPD (chronic obstructive pulmonary disease) ([place])   . Coronary artery disease   . Depression   . DVT (deep venous thrombosis) (HCC)    left  . Factor V Leiden ([place])   . Fibromyalgia   . GERD (gastroesophageal reflux disease)   . Headache    sesaonal  . Herpes simplex type 2 infection   . History of blood transfusion   . Hyperlipidemia   . Hypotension    90's, Normally; in pain normal; BP  . Ischemic cardiomyopathy   . MI (myocardial infarction) ([place])    x 2 2000   . Pneumonia   . Stroke ([place]) 02/27/2018   no residual    Surgeries: Procedure(s): OPEN REDUCTION INTERNAL FIXATION (ORIF) ANKLE FRACTURE on 10/18/2018   Consultants (if any):   Discharged Condition: Improved  Hospital Course: Lindsay Bradford is an 58 y.o. female who was admitted 10/18/2018 with a diagnosis of Closed displaced trimalleolar fracture of right lower leg and went to the operating room on 10/18/2018 and underwent the above named procedures.    She was given perioperative antibiotics:  Anti-infectives (From admission, onward)   Start     Dose/Rate Route Frequency Ordered Stop   10/19/18 1000  valACYclovir (VALTREX) tablet 2,000 mg     2,000 mg Oral Daily 10/18/18 1417     10/18/18 2000  vancomycin (VANCOCIN) IVPB 1000 mg/200 mL premix     1,000 mg 200 mL/hr over 60 Minutes Intravenous Every 12 hours 10/18/18 1526 10/19/18 1959   10/18/18 1015  vancomycin (VANCOCIN) powder  Status:  Discontinued     As needed 10/18/18 1015 10/18/18 1114   10/18/18 0600  vancomycin (VANCOCIN) 1,500 mg in sodium chloride 0.9 % 500 mL IVPB     1,500 mg 250 mL/hr over 120 Minutes Intravenous On call to O.R. 10/17/18 4098 10/18/18 1925    .  She was given sequential compression devices, early ambulation, and Lovenox for DVT prophylaxis.  Home dose of Xarelto resumed POD1.  She benefited maximally from the hospital stay and there were no complications.    Recent vital signs:  Vitals:   10/19/18 0443 10/19/18 0752  BP: 122/70 (!) 101/58  Pulse: 62 61  Resp: 16 17  Temp: 97.8 F (36.6 C) 97.7 F (36.5 C)  SpO2: 95% 96%    Recent laboratory studies:  Lab Results  Component Value Date   HGB 12.6 10/19/2018   HGB 13.8 10/18/2018   HGB 13.6 10/18/2018   Lab Results  Component Value Date   WBC 13.0 (H) 10/19/2018   PLT 245 10/19/2018   Lab Results  Component Value Date   INR 1.0 10/18/2018   Lab Results  Component Value Date   NA 142 10/19/2018   K 4.2 10/19/2018   CL 111 10/19/2018   CO2 23 10/19/2018   BUN 8 10/19/2018   CREATININE 0.62 10/19/2018   GLUCOSE 132 (H) 10/19/2018    Discharge Medications:   Allergies as of 10/19/2018  Reactions   Cephalexin Hives   Clarithromycin Hives   Ibuprofen Hives   Other Itching, Other (See Comments)   Whole milk and oranges   Oxymorphone Palpitations   Penicillins Anaphylaxis   Did it involve swelling of the face/tongue/throat, SOB, or low BP? Yes Did it involve sudden or severe rash/hives, skin peeling, or any reaction on the inside of your mouth or nose? Yes Did you need to seek medical attention at a hospital or doctor's office? Yes When did it last happen?childhood allergy If all above answers are "NO", may proceed with cephalosporin use.   Potassium Chloride Hives   Reacts to oral KCl, tolerates it slow IV   Sulfa Antibiotics Anaphylaxis   Clindamycin Hives, Itching   Fentanyl Hives, Other (See Comments)   Fentanyl  patch, it breaks pt out in hives and lowers her blood pressure.   Naproxen Hives   Iodinated Diagnostic Agents Hives   Tetracycline Hives, Itching      Medication List    STOP taking these medications   aspirin 81 MG EC tablet     TAKE these medications   albuterol 108 (90 Base) MCG/ACT inhaler Commonly known as: VENTOLIN HFA TAKE 2 PUFFS BY MOUTH EVERY 6 HOURS AS NEEDED FOR WHEEZE OR SHORTNESS OF BREATH What changed: See the new instructions.   carvedilol 6.25 MG tablet Commonly known as: COREG Take 6.25 mg by mouth daily.   DULoxetine 20 MG capsule Commonly known as: CYMBALTA Please specify directions, refills and quantity   HYDROcodone-acetaminophen 5-325 MG tablet Commonly known as: NORCO/VICODIN Take 1 tablet by mouth every 4 (four) hours as needed. Start taking on: October 22, 2018 What changed: These instructions start on October 22, 2018. If you are unsure what to do until then, ask your doctor or other care provider.   methocarbamol 500 MG tablet Commonly known as: ROBAXIN Take 1 tablet (500 mg total) by mouth every 6 (six) hours as needed for muscle spasms.   nitroGLYCERIN 0.4 MG SL tablet Commonly known as: NITROSTAT Place 0.4 mg under the tongue every 5 (five) minutes as needed for chest pain.   pantoprazole 40 MG tablet Commonly known as: PROTONIX Take 1 tablet (40 mg total) by mouth daily.   rosuvastatin 40 MG tablet Commonly known as: Crestor Take 1 tablet (40 mg total) by mouth daily.   Spiriva HandiHaler 18 MCG inhalation capsule Generic drug: tiotropium Place 1 capsule (18 mcg total) into inhaler and inhale at bedtime. What changed:   when to take this  reasons to take this   topiramate 100 MG tablet Commonly known as: TOPAMAX Take 100 mg by mouth daily.   triamcinolone cream 0.1 % Commonly known as: KENALOG APPLY TO AFFECTED AREA TWICE A DAY What changed: See the new instructions.   valACYclovir 1000 MG tablet Commonly known as:  VALTREX Take 2,000 mg by mouth daily.   venlafaxine XR 150 MG 24 hr capsule Commonly known as: EFFEXOR-XR Take 150 mg by mouth daily with breakfast.   Xarelto 20 MG Tabs tablet Generic drug: rivaroxaban Take 1 tablet (20 mg total) by mouth daily.            Durable Medical Equipment  (From admission, onward)         Start     Ordered   10/18/18 2109  For home use only DME Tub bench  Once     10/18/18 2108   10/18/18 1417  For home use only DME 3 n 1  Once  10/18/18 1417          Diagnostic Studies: Dg Tibia/fibula Right  Result Date: 10/18/2018 CLINICAL DATA:  Open reduction internal fixation of the ankle. EXAM: RIGHT TIBIA AND FIBULA - 2 VIEW COMPARISON:  10/13/2018 FINDINGS: The patient has undergone ORIF of the ankle. The hardware is intact. The alignment is improved. There are expected postsurgical changes including subcutaneous gas and overlying soft tissue edema. IMPRESSION: Status post ORIF of the ankle with improved alignment. Electronically Signed   By: Constance Holster M.D.   On: 10/18/2018 10:51   Dg Ankle Complete Right  Result Date: 10/13/2018 CLINICAL DATA:  Pain after fall EXAM: RIGHT ANKLE - COMPLETE 3+ VIEW COMPARISON:  None. FINDINGS: There is a fracture through the distal fibula above the ankle. There is a fracture through the medial malleolus. There is also a pulse tear malleolar fracture. The ankle mortise is intact. IMPRESSION: 1. Fractures are seen through the posterior tibia/posterior malleolus, the medial malleolus, and the distal fibular diaphysis above the ankle. The ankle mortise is intact. Electronically Signed   By: Dorise Bullion III M.D   On: 10/13/2018 21:31   Dg Knee Complete 4 Views Right  Result Date: 10/13/2018 CLINICAL DATA:  Pain after fall. EXAM: RIGHT KNEE - COMPLETE 4+ VIEW COMPARISON:  None. FINDINGS: No evidence of fracture, dislocation, or joint effusion. No evidence of arthropathy or other focal bone abnormality. Soft  tissues are unremarkable. IMPRESSION: Negative. Electronically Signed   By: Dorise Bullion III M.D   On: 10/13/2018 21:29   Dg Ankle Right Port  Result Date: 10/18/2018 CLINICAL DATA:  Post-op right ankle fracture. EXAM: PORTABLE RIGHT ANKLE - 2 VIEW COMPARISON:  Right ankle radiographs 10/13/2018 FINDINGS: Status post ORIF of the ankle for multiple fractures in the distal tibia and fibula. The hardware appears intact. Near anatomic alignment. Evaluation of the soft tissues somewhat limited by overlying splint. There is a subtle oblique linear lucency in the mid/distal tibia just above the level of the fibular hardware seen only on oblique view which is favored to represent artifact from the splint, less likely an additional fracture. IMPRESSION: 1. Status post ORIF for multiple fractures of the distal tibia and fibula. 2. Subtle oblique linear lucency in the mid/distal tibia seen on only one view, favored to represent artifact from the splint, less likely an additional fracture. Electronically Signed   By: Audie Pinto M.D.   On: 10/18/2018 15:53   Dg Foot Complete Right  Result Date: 10/13/2018 CLINICAL DATA:  Pain after fall EXAM: RIGHT FOOT COMPLETE - 3+ VIEW COMPARISON:  None. FINDINGS: Ankle fractures described on the ankle films are seen on this study as well. No acute fractures seen within the foot. IMPRESSION: Ankle fractures described on the ankle films. No foot fractures noted. Electronically Signed   By: Dorise Bullion III M.D   On: 10/13/2018 21:32   Dg C-arm 1-60 Min  Result Date: 10/18/2018 CLINICAL DATA:  ORIF of the right ankle EXAM: DG C-ARM 1-60 MIN FLUOROSCOPY TIME:  Fluoroscopy Time:  1 minutes and 54 seconds Number of Acquired Spot Images: 8 COMPARISON:  10/13/2018 FINDINGS: The patient has undergone ORIF of the right ankle. The alignment is improved. The hardware is intact. There are expected postsurgical changes. IMPRESSION: Status post ORIF of the right ankle with improved  osseous alignment. Electronically Signed   By: Constance Holster M.D.   On: 10/18/2018 10:51    Disposition: Discharge disposition: 01-Home or Self Care  Discharge Instructions    Discharge patient   Complete by: As directed    After DME needs are met   Discharge disposition: 01-Home or Self Care   Discharge patient date: 10/19/2018      Follow-up Information    Haddix, Thomasene Lot, MD. Schedule an appointment as soon as possible for a visit in 2 week(s).   Specialty: Orthopedic Surgery Why: suture removal, splint removal Contact information: [place] [address] [phone]            Signed: Prudencio Burly III PA-C 10/19/2018, 8:46 AM

## 2018-10-19 NOTE — Progress Notes (Signed)
Subjective: Patient reports pain as mild.  Tolerating diet.  Urinating.  No CP, SOB.  Early mobilization with therapy.    Objective:   VITALS:   Vitals:   10/18/18 2001 10/19/18 0028 10/19/18 0443 10/19/18 0752  BP: 98/74 (!) 102/56 122/70 (!) 101/58  Pulse: 81 76 62 61  Resp: _0 Temp: 98.4 F (36.9 C) 98.9 F (37.2 C) 97.8 F (36.6 C) 97.7 F (36.5 C)  TempSrc: Oral Oral Oral Oral  SpO2: 93% 91% 95% 96%  Weight:      Height:       CBC Latest Ref Rng & Units 10/19/2018 10/18/2018 10/18/2018  WBC 4.0 - 10.5 K/uL 13.0(H) 8.3 -  Hemoglobin 12.0 - 15.0 g/dL 12.6 13.8 13.6  Hematocrit 36.0 - 46.0 % 37.9 42.8 40.0  Platelets 150 - 400 K/uL 245 280 -   BMP Latest Ref Rng & Units 10/19/2018 10/18/2018 10/18/2018  Glucose 70 - 99 mg/dL 132(H) - 113(H)  BUN 6 - 20 mg/dL 8 - 8  Creatinine 0.44 - 1.00 mg/dL 0.62 0.84 0.60  BUN/Creat Ratio 9 - 23 - - -  Sodium 135 - 145 mmol/L 142 - 142  Potassium 3.5 - 5.1 mmol/L 4.2 - 3.6  Chloride 98 - 111 mmol/L 111 - 106  CO2 22 - 32 mmol/L 23 - -  Calcium 8.9 - 10.3 mg/dL 8.4(L) - -   Intake/Output      10/02 0701 - 10/03 0700 10/03 0701 - 10/04 0700   P.O. 240    I.V. (mL/kg) 500 (4.8)    Total Intake(mL/kg) 740 (7.1)    Urine (mL/kg/hr) 0 (0)    Blood 10    Total Output 10    Net +730         Urine Occurrence 6 x    Stool Occurrence 1 x       Physical Exam: General: NAD.  Upright in bed.  Calm, conversant.   Resp: No increased wob.  IS to 1500 Cardio: regular rate and rhythm ABD soft Neurologically intact MSK RLE: Splint intact and in good condition Sensation grossly intact distally -nerve block still in effect Toes warm.  Starting to wiggle toes.    Assessment: 1 Day Post-Op  S/P Procedure(s) (LRB): OPEN REDUCTION INTERNAL FIXATION (ORIF) ANKLE FRACTURE (Right) by Dr. Doreatha Martin on 10/18/2018  Principal Problem:   Closed displaced trimalleolar fracture of right lower leg Active Problems:   Closed  trimalleolar fracture of ankle, right, initial encounter   Closed trimalleolar ankle fracture, status post ORIF Doing well postop day 1 Tolerating diet and voiding Pain controlled Early mobilization with therapy-will need wheelchair before discharge  Plan: Up with therapy Incentive Spirometry Elevate and Apply ice Smoking cessation  Weightbearing: NWB RLE Insicional and dressing care: Dressings left intact until follow-up Orthopedic device(s): Splint Showering: Keep dressing dry VTE prophylaxis: Previously Rx'd Lovenox 21m qd for an additional 2 days post op.  Resume Xarelto POD1 , SCDs, ambulation Pain control: Continue current regimen Follow - up plan: 1-2 week  Dispo: Home today when DME needs are met.  Daughter to pick up early evening.    HCharna ElizabethMartensen III, PA-C 10/19/2018, 8:32 AM

## 2018-10-19 NOTE — TOC Transition Note (Addendum)
Transition of Care [facility]) - CM/SW Discharge Note   Patient Details  Name: Lindsay Bradford MRN: [id number] Date of Birth: [birth year]  [facility] CM/SW Contact:  Apolonio Schneiders, RN Phone Number: 10/19/2018, 9:12 AM   Clinical Narrative:    CM spoke with the patient at the bedside. Patient provided with a Medicare Compare List and she selected [facility] for home health. Patient states she will have assistance at home from her family members. Reports she is able to afford her medications and has transportation to her appointments.   0916 - Message left on Ellen's with [facility]'s voicemail.   Final next level of care: Home w Home Health Services Barriers to Discharge: No Barriers Identified   Patient Goals and CMS Choice   CMS Medicare.gov Compare Post Acute Care list provided to:: Patient Choice offered to / list presented to : Patient  Discharge Placement                       Discharge Plan and Services                DME Arranged: Lightweight manual wheelchair with seat cushion, 3-N-1 DME Agency: [employer] Date DME Agency Contacted: 10/19/18 Time DME Agency Contacted: [phone] Representative spoke with at DME Agency: [employer]: PT [place]: [facility] Date [place]: 10/19/18 Time HH Agency Contacted: 0900 Representative spoke with at [place]: Message left on weekend voicemail. Requested a call back with confirmation of accepting the patient.  Social Determinants of Health (SDOH) Interventions     Readmission Risk Interventions No flowsheet data found.

## 2018-10-22 ENCOUNTER — Encounter (HOSPITAL_COMMUNITY): Payer: Self-pay | Admitting: Student

## 2018-10-22 DIAGNOSIS — M4317 Spondylolisthesis, lumbosacral region: Secondary | ICD-10-CM | POA: Diagnosis not present

## 2018-10-22 DIAGNOSIS — J439 Emphysema, unspecified: Secondary | ICD-10-CM | POA: Diagnosis not present

## 2018-10-22 DIAGNOSIS — M797 Fibromyalgia: Secondary | ICD-10-CM | POA: Diagnosis not present

## 2018-10-22 DIAGNOSIS — S82491D Other fracture of shaft of right fibula, subsequent encounter for closed fracture with routine healing: Secondary | ICD-10-CM | POA: Diagnosis not present

## 2018-10-22 DIAGNOSIS — F331 Major depressive disorder, recurrent, moderate: Secondary | ICD-10-CM | POA: Diagnosis not present

## 2018-10-22 DIAGNOSIS — S82851D Displaced trimalleolar fracture of right lower leg, subsequent encounter for closed fracture with routine healing: Secondary | ICD-10-CM | POA: Diagnosis not present

## 2018-10-22 DIAGNOSIS — I739 Peripheral vascular disease, unspecified: Secondary | ICD-10-CM | POA: Diagnosis not present

## 2018-10-22 DIAGNOSIS — M1990 Unspecified osteoarthritis, unspecified site: Secondary | ICD-10-CM | POA: Diagnosis not present

## 2018-10-22 DIAGNOSIS — I2510 Atherosclerotic heart disease of native coronary artery without angina pectoris: Secondary | ICD-10-CM | POA: Diagnosis not present

## 2018-10-24 ENCOUNTER — Encounter (HOSPITAL_COMMUNITY): Payer: Self-pay | Admitting: Emergency Medicine

## 2018-10-24 ENCOUNTER — Emergency Department (HOSPITAL_COMMUNITY)
Admission: EM | Admit: 2018-10-24 | Discharge: 2018-10-25 | Disposition: A | Discharge status: Home / Self Care | Payer: Medicare HMO | Attending: Emergency Medicine | Admitting: Emergency Medicine

## 2018-10-24 ENCOUNTER — Other Ambulatory Visit: Payer: Self-pay

## 2018-10-24 DIAGNOSIS — I82402 Acute embolism and thrombosis of unspecified deep veins of left lower extremity: Secondary | ICD-10-CM | POA: Diagnosis not present

## 2018-10-24 DIAGNOSIS — R200 Anesthesia of skin: Secondary | ICD-10-CM

## 2018-10-24 DIAGNOSIS — J449 Chronic obstructive pulmonary disease, unspecified: Secondary | ICD-10-CM | POA: Diagnosis not present

## 2018-10-24 DIAGNOSIS — F17210 Nicotine dependence, cigarettes, uncomplicated: Secondary | ICD-10-CM | POA: Insufficient documentation

## 2018-10-24 DIAGNOSIS — I259 Chronic ischemic heart disease, unspecified: Secondary | ICD-10-CM | POA: Diagnosis not present

## 2018-10-24 DIAGNOSIS — Z4689 Encounter for fitting and adjustment of other specified devices: Secondary | ICD-10-CM | POA: Insufficient documentation

## 2018-10-24 DIAGNOSIS — Z7901 Long term (current) use of anticoagulants: Secondary | ICD-10-CM | POA: Diagnosis not present

## 2018-10-24 DIAGNOSIS — R202 Paresthesia of skin: Secondary | ICD-10-CM | POA: Diagnosis not present

## 2018-10-24 DIAGNOSIS — J45909 Unspecified asthma, uncomplicated: Secondary | ICD-10-CM | POA: Diagnosis not present

## 2018-10-24 DIAGNOSIS — Z955 Presence of coronary angioplasty implant and graft: Secondary | ICD-10-CM | POA: Diagnosis not present

## 2018-10-24 DIAGNOSIS — M79661 Pain in right lower leg: Secondary | ICD-10-CM | POA: Diagnosis present

## 2018-10-24 NOTE — ED Provider Notes (Signed)
[facility] EMERGENCY DEPARTMENT Provider Note   CSN: [id number] Arrival date & time: 10/24/18  2031     History   Chief Complaint Chief Complaint  Patient presents with  . Leg Pain    HPI Lindsay Bradford is a 58 y.o. female.     Patient is s/p ORIF trimalleolar fracture of the right leg on 10/18/18. She is reporting burning pain along her calf and along the anterior aspect of the right lower leg. She has a cast with a "window" from the ankle to superior aspect of the leg. Pain and discomfort seems to be localized along the edges of the "window", as well as the posterior aspect of the heel. Toes are cool to touch, capillary refill present. Patient also reports decreased sensation of the toes. She reports her toes turn blue/purple when her foot is dependent, improves when leg is elevated.  The history is provided by the patient. No language interpreter was used.  Leg Pain Location:  Leg Injury: yes   Mechanism of injury: fall   Fall:    Fall occurred:  Down stairs   Height of fall:  1 ft Leg location:  R lower leg Pain details:    Quality:  Aching and burning   Severity:  Severe   Onset quality:  Gradual   Duration:  2 days   Timing:  Constant   Progression:  Worsening Chronicity:  New Prior injury to area:  Yes   Past Medical History:  Diagnosis Date  . Arthritis   . Asthma   . Bursitis of left hip   . COPD (chronic obstructive pulmonary disease) ([place])   . Coronary artery disease   . Depression   . DVT (deep venous thrombosis) ([facility])    left  . Factor V Leiden ([place])   . Fibromyalgia   . GERD (gastroesophageal reflux disease)   . Headache    sesaonal  . Herpes simplex type 2 infection   . History of blood transfusion   . Hyperlipidemia   . Hypotension    90's, Normally; in pain normal; BP  . Ischemic cardiomyopathy   . MI (myocardial infarction) ([place])    x 2 2000   . Pneumonia   . Stroke [facility]) 02/27/2018   no residual    Patient Active Problem List   Diagnosis Date Noted  . Closed trimalleolar fracture of ankle, right, initial encounter 10/18/2018  . Closed displaced trimalleolar fracture of right lower leg 10/15/2018  . Coronary artery disease involving native coronary artery of native heart 08/26/2018  . Morbid obesity ([place]) 08/26/2018  . NSVT (nonsustained ventricular tachycardia) ([place]) 06/18/2018  . Ischemic cardiomyopathy 03/13/2018  . S/P coronary artery stent placement 03/13/2018  . Dyslipidemia 08/02/2017  . Peripheral arterial disease ([place]) 06/06/2017  . CAD S/P percutaneous coronary angioplasty 08/25/2016  . Spondylolisthesis of lumbosacral region 01/14/2014  . Moderate episode of recurrent major depressive disorder ([place]) 08/29/2013  . Arthritis 04/05/2012  . History of DVT (deep vein thrombosis) 02/02/2012  . COPD with emphysema ([place]) 03/21/2011  . Factor V Leiden ([place]) 03/21/2011  . History of CVA (cerebrovascular accident) 03/21/2011    Past Surgical History:  Procedure Laterality Date  . ABDOMINAL HYSTERECTOMY    . APPENDECTOMY    . CARDIAC CATHETERIZATION    . COLONOSCOPY    . FOOT SURGERY Bilateral   . ORIF ANKLE FRACTURE Right 10/18/2018   Procedure: OPEN REDUCTION INTERNAL FIXATION (ORIF) ANKLE FRACTURE;  Surgeon: Shona Needles, MD;  Location: [place];  Service: Orthopedics;  Laterality: Right;  . SHOULDER SURGERY Right   . VEIN BYPASS SURGERY Left      OB History   No obstetric history on file.      Home Medications    Prior to Admission medications   Medication Sig Start Date End Date Taking? Authorizing Provider  albuterol (VENTOLIN HFA) 108 (90 Base) MCG/ACT inhaler TAKE 2 PUFFS BY MOUTH EVERY 6 HOURS AS NEEDED FOR WHEEZE OR SHORTNESS OF BREATH Patient taking differently: Inhale 1 puff into the lungs every 6 (six) hours as needed for wheezing or shortness of breath.  09/10/18   Jannifer RodneyHawks, Christy A, FNP  carvedilol (COREG) 6.25 MG tablet Take 6.25 mg by mouth daily.  03/19/18   [provider]   DULoxetine (CYMBALTA) 20 MG capsule Please specify directions, refills and quantity Patient not taking: Reported on 10/15/2018 08/27/18   Jannifer RodneyHawks, Christy A, FNP  HYDROcodone-acetaminophen (NORCO/VICODIN) 5-325 MG tablet Take 1 tablet by mouth every 4 (four) hours as needed. 10/22/18   Despina HiddenYacobi, Sarah A, PA-C  methocarbamol (ROBAXIN) 500 MG tablet Take 1 tablet (500 mg total) by mouth every 6 (six) hours as needed for muscle spasms. 10/18/18   Despina HiddenYacobi, Sarah A, PA-C  nitroGLYCERIN (NITROSTAT) 0.4 MG SL tablet Place 0.4 mg under the tongue every 5 (five) minutes as needed for chest pain.    [provider]  pantoprazole (PROTONIX) 40 MG tablet Take 1 tablet (40 mg total) by mouth daily. 08/26/18   Junie SpencerHawks, Christy A, FNP  rosuvastatin (CRESTOR) 40 MG tablet Take 1 tablet (40 mg total) by mouth daily. 08/26/18   Junie SpencerHawks, Christy A, FNP  tiotropium (SPIRIVA HANDIHALER) 18 MCG inhalation capsule Place 1 capsule (18 mcg total) into inhaler and inhale at bedtime. Patient taking differently: Place 18 mcg into inhaler and inhale daily as needed (shortness of breath).  08/26/18   Jannifer RodneyHawks, Christy A, FNP  topiramate (TOPAMAX) 100 MG tablet Take 100 mg by mouth daily.    [provider]  triamcinolone cream (KENALOG) 0.1 % APPLY TO AFFECTED AREA TWICE A DAY Patient taking differently: Apply 1 application topically 2 (two) times daily as needed (itching).  09/10/18   Junie SpencerHawks, Christy A, FNP  valACYclovir (VALTREX) 1000 MG tablet Take 2,000 mg by mouth daily.    [provider]  venlafaxine XR (EFFEXOR-XR) 150 MG 24 hr capsule Take 150 mg by mouth daily with breakfast.    [provider]  XARELTO 20 MG TABS tablet Take 1 tablet (20 mg total) by mouth daily. 08/26/18   Junie SpencerHawks, Christy A, FNP    Family History Family History  Problem Relation Age of Onset  . Arthritis Mother   . Cancer Mother   . Colon cancer Mother   . Breast cancer Mother   . COPD Father   . COPD Brother   . Heart disease  Sister   . COPD Brother   . Stroke Brother   . Heart disease Brother   . Clotting disorder Brother   . Colon cancer Maternal Grandmother   . Cancer Maternal Grandmother   . Breast cancer Maternal Grandmother   . Cancer Paternal Grandmother   . Colon cancer Paternal Grandmother   . Cancer Nephew   . Colon cancer Nephew     Social History Social History   Tobacco Use  . Smoking status: Heavy Tobacco Smoker    Packs/day: 0.50    Years: 35.00    Pack years: 17.50    Types: Cigarettes  .  Smokeless tobacco: Never Used  Substance Use Topics  . Alcohol use: Not Currently  . Drug use: Not Currently     Allergies   Cephalexin, Clarithromycin, Ibuprofen, Other, Oxymorphone, Penicillins, Potassium chloride, Sulfa antibiotics, Clindamycin, Fentanyl, Naproxen, Iodinated diagnostic agents, and Tetracycline   Review of Systems Review of Systems  Musculoskeletal: Positive for arthralgias.  Skin: Positive for wound.  All other systems reviewed and are negative.    Physical Exam Updated Vital Signs BP 110/76   Pulse 73   Temp 98 F (36.7 C)   Resp 18   Ht 5\' 7"  (1.702 m)   Wt 104.3 kg   SpO2 96%   BMI 36.02 kg/m   Physical Exam Vitals signs and nursing note reviewed.  Constitutional:      Appearance: Normal appearance.  HENT:     Head: Atraumatic.  Eyes:     Conjunctiva/sclera: Conjunctivae normal.  Cardiovascular:     Rate and Rhythm: Normal rate and regular rhythm.  Pulmonary:     Effort: Pulmonary effort is normal.     Breath sounds: Normal breath sounds.  Abdominal:     Palpations: Abdomen is soft.  Musculoskeletal:        General: Swelling and tenderness present.     Comments: After removal of cast, noted sutured surgical lacerations of lateral and medial aspect of right lower leg. No sign of infection. Bruising noted along posterior aspect of right heel. Toes pink and warm. Brisk capillary refill.  Skin:    General: Skin is warm and dry.  Neurological:      Mental Status: She is alert and oriented to person, place, and time.  Psychiatric:        Mood and Affect: Mood normal.      ED Treatments / Results  Labs (all labs ordered are listed, but only abnormal results are displayed) Labs Reviewed - No data to display  EKG None  Radiology No results found.  Procedures Procedures (including critical care time)  Medications Ordered in ED Medications - No data to display   Initial Impression / Assessment and Plan / ED Course  I have reviewed the triage vital signs and the nursing notes.  Pertinent labs & imaging results that were available during my care of the patient were reviewed by me and considered in my medical decision making (see chart for details).        Cast removed. Immediate relief of discomfort and numbness. Surgical sites cleaned and dressed. Bulky posterior splint applied. Splint care instructions provided, return precautions discussed. Patient is scheduled for follow-up with her orthopedic surgeon on Tuesday,  October 29, 2018.  Final Clinical Impressions(s) / ED Diagnoses   Final diagnoses:  Numbness and tingling of right lower extremity    ED Discharge Orders    None       October 31, 2018, NP 10/25/18 12/25/18    0630, MD 10/25/18 1020

## 2018-10-24 NOTE — ED Triage Notes (Signed)
Pt c/o left calf burning x 2 days and surgeon told her to come to ER to be evaluated. Pt states pain is worse.

## 2018-10-25 DIAGNOSIS — R202 Paresthesia of skin: Secondary | ICD-10-CM | POA: Diagnosis not present

## 2018-10-25 MED ORDER — BACITRACIN-NEOMYCIN-POLYMYXIN 400-5-5000 EX OINT
TOPICAL_OINTMENT | Freq: Once | CUTANEOUS | Status: AC
  Administered 2018-10-25: via TOPICAL
  Filled 2018-10-25: qty 1

## 2018-10-25 NOTE — ED Notes (Addendum)
New posterior splint applied to right leg. Wound care complete.

## 2018-10-27 NOTE — Anesthesia Postprocedure Evaluation (Signed)
Anesthesia Post Note  Patient: Lindsay Bradford  Procedure(s) Performed: OPEN REDUCTION INTERNAL FIXATION (ORIF) ANKLE FRACTURE (Right Ankle)     Patient location during evaluation: PACU Anesthesia Type: General Level of consciousness: sedated and patient cooperative Pain management: pain level controlled Vital Signs Assessment: post-procedure vital signs reviewed and stable Respiratory status: spontaneous breathing Cardiovascular status: stable Anesthetic complications: no    Last Vitals:  Vitals:   10/19/18 1000 10/19/18 1532  BP: 95/61 114/67  Pulse: 72 72  Resp: 16 16  Temp:  36.7 C  SpO2: 95% 95%    Last Pain:  Vitals:   10/19/18 1532  TempSrc: Oral  PainSc:                  Nolon Nations

## 2018-10-29 DIAGNOSIS — S82851D Displaced trimalleolar fracture of right lower leg, subsequent encounter for closed fracture with routine healing: Secondary | ICD-10-CM | POA: Diagnosis not present

## 2018-10-30 ENCOUNTER — Telehealth: Payer: Self-pay | Admitting: Family

## 2018-10-30 ENCOUNTER — Ambulatory Visit (INDEPENDENT_AMBULATORY_CARE_PROVIDER_SITE_OTHER): Payer: Medicare HMO | Admitting: Licensed Clinical Social Worker

## 2018-10-30 DIAGNOSIS — J439 Emphysema, unspecified: Secondary | ICD-10-CM

## 2018-10-30 DIAGNOSIS — F331 Major depressive disorder, recurrent, moderate: Secondary | ICD-10-CM

## 2018-10-30 DIAGNOSIS — M1990 Unspecified osteoarthritis, unspecified site: Secondary | ICD-10-CM

## 2018-10-30 DIAGNOSIS — I2510 Atherosclerotic heart disease of native coronary artery without angina pectoris: Secondary | ICD-10-CM | POA: Diagnosis not present

## 2018-10-30 DIAGNOSIS — Z8673 Personal history of transient ischemic attack (TIA), and cerebral infarction without residual deficits: Secondary | ICD-10-CM

## 2018-10-30 DIAGNOSIS — Z86718 Personal history of other venous thrombosis and embolism: Secondary | ICD-10-CM

## 2018-10-30 NOTE — Telephone Encounter (Signed)
Please advise if okay to provide patient with letter of need for electric company

## 2018-10-30 NOTE — Patient Instructions (Signed)
Licensed Clinical Social Worker Visit Information  Goals we discussed today:  Goals Addressed            This Visit's Progress   . Client will talk with LCSW in next 30 days to discuss community resources of help in area (pt-stated)       Current Barriers:  [name] Mobility challenges (uses wheelchair to ambulate . Needs help with ADLs  Clinical Social Work Clinical Goal(s):  . Client and LCSW to talk in next 30 days to discuss community resources of help to client  Interventions: . Talked with Lucina about CCM program and services . Talked with Kristine about her recent surgery on right ankle . Talked with Judye about her mobility challenges (uses wheelchair) . Talked with Evagelia about in home support services through ADTS (LCSW provided [place] with number for [employer]) . Talked with Elishia about transport needs of client . Talked with Morgane about social support network (receives support from her son, [name],; receives support from grandson, and receives some help from her daughter)   Patient Self Care Activities:  . Takes medications as prescribed . Attends scheduled medical appointments   Plan:  LCSW to call client in next 3 weeks to talk with her about community resources of help to client Client to attends scheduled medical appointments Client to communicate with RNCM as needed to discuss nursing needs Client to participate in physical therapy sessions as scheduled .   Initial goal documentation          Materials Provided: No  Follow Up Plan: LCSW to call client in next  3 weeks to talk with her about community resources of help to client  The patient verbalized understanding of instructions provided today and declined a print copy of patient instruction materials.   Norva Riffle.Ailyn Gladd MSW, LCSW Licensed Clinical Social Worker [facility] Care Management [phone]

## 2018-10-30 NOTE — Chronic Care Management (AMB) (Addendum)
Care Management Note   Lindsay Bradford is a 58 y.o. year old female who is a primary care patient of Sharion Balloon, FNP. The CM team was consulted for assistance with chronic disease management and care coordination.   I reached out to [employer] by phone today.   Lindsay Bradford was given information about Chronic Care Management services today including:  1. CCM service includes personalized support from designated clinical staff supervised by her physician, including individualized plan of care and coordination with other care providers 2. 24/7 contact phone numbers for assistance for urgent and routine care needs. 3. Service will only be billed when office clinical staff spend 20 minutes or more in a month to coordinate care. 4. Only one practitioner may furnish and bill the service in a calendar month. 5. The patient may stop CCM services at any time (effective at the end of the month) by phone call to the office staff. 6. The patient will be responsible for cost sharing (co-pay) of up to 20% of the service fee (after annual deductible is met). Patient agreed to services and verbal consent obtained.    Review of patient status, including review of consultants reports, relevant laboratory and other test results, and collaboration with appropriate care team members and the patient's provider was performed as part of comprehensive patient evaluation and provision of chronic care management services.     Chronic Care Management from 10/30/2018 in [place]  PHQ-9 Total Score  7     GAD 7 : Generalized Anxiety Score 10/30/2018  Nervous, Anxious, on Edge 1  Control/stop worrying 1  Worry too much - different things 1  Trouble relaxing 1  Restless 1  Easily annoyed or irritable 0  Afraid - awful might happen 0  Total GAD 7 Score 5  Anxiety Difficulty Somewhat difficult    Medications    albuterol (VENTOLIN HFA) 108 (90 Base) MCG/ACT inhaler    carvedilol  (COREG) 6.25 MG tablet    DULoxetine (CYMBALTA) 20 MG capsule    HYDROcodone-acetaminophen (NORCO/VICODIN) 5-325 MG tablet    methocarbamol (ROBAXIN) 500 MG tablet    nitroGLYCERIN (NITROSTAT) 0.4 MG SL tablet    pantoprazole (PROTONIX) 40 MG tablet    rosuvastatin (CRESTOR) 40 MG tablet    tiotropium (SPIRIVA HANDIHALER) 18 MCG inhalation capsule    topiramate (TOPAMAX) 100 MG tablet    triamcinolone cream (KENALOG) 0.1 %    valACYclovir (VALTREX) 1000 MG tablet    venlafaxine XR (EFFEXOR-XR) 150 MG 24 hr capsule    XARELTO 20 MG TABS tablet    Goals    .         [name] Client will talk with LCSW in next 30 days to discuss community resources of help in area (pt-stated)     Current Barriers:  [name] Mobility challenges (uses wheelchair to ambulate . Needs help with ADLs  Clinical Social Work Clinical Goal(s):  . Client and LCSW to talk in next 30 days to discuss community resources of help to client  Interventions: . Talked with Lindsay Bradford about CCM program and services . Talked with Lindsay Bradford about her recent surgery on right ankle . Talked with Lindsay Bradford about her mobility challenges (uses wheelchair) . Talked with Lindsay Bradford about in home support services through ADTS (LCSW provided [place] with number for ADTS) . Talked with Lindsay Bradford about transport needs of client . Talked with Lindsay Bradford about social support network (receives support from her son, Lindsay Bradford,; receives support from grandson, and  receives some help from her daughter)  . Talked with Lindsay Bradford about [place] support through CCM program  Patient Self Care Activities:  . Takes medications as prescribed . Attends scheduled medical appointments  Plan:  LCSW to call client in next 3 weeks to talk with her about community resources of help to client Client to attends scheduled medical appointments Client to communicate with RNCM as needed to discuss nursing needs Client to participate in physical therapy sessions as scheduled .   Initial goal documentation          Follow Up Plan: LCSW to call client in next 3 weeks to talk with client about community resources of help to client  Lindsay Bradford.Lindsay Bradford MSW, LCSW Licensed Clinical Social Worker [facility] Care [facility] [phone]  I have reviewed and agree with the above  documentation.   Evelina Dun, FNP

## 2018-10-31 NOTE — Telephone Encounter (Signed)
Ok to give patient letter

## 2018-11-01 ENCOUNTER — Encounter: Payer: Self-pay | Admitting: *Deleted

## 2018-11-01 NOTE — Telephone Encounter (Signed)
Letter written and pt called

## 2018-11-04 DIAGNOSIS — Z95818 Presence of other cardiac implants and grafts: Secondary | ICD-10-CM | POA: Diagnosis not present

## 2018-11-04 DIAGNOSIS — R55 Syncope and collapse: Secondary | ICD-10-CM | POA: Diagnosis not present

## 2018-11-05 DIAGNOSIS — M797 Fibromyalgia: Secondary | ICD-10-CM | POA: Diagnosis not present

## 2018-11-05 DIAGNOSIS — I2510 Atherosclerotic heart disease of native coronary artery without angina pectoris: Secondary | ICD-10-CM | POA: Diagnosis not present

## 2018-11-05 DIAGNOSIS — S82851D Displaced trimalleolar fracture of right lower leg, subsequent encounter for closed fracture with routine healing: Secondary | ICD-10-CM | POA: Diagnosis not present

## 2018-11-05 DIAGNOSIS — M4317 Spondylolisthesis, lumbosacral region: Secondary | ICD-10-CM | POA: Diagnosis not present

## 2018-11-05 DIAGNOSIS — M1990 Unspecified osteoarthritis, unspecified site: Secondary | ICD-10-CM | POA: Diagnosis not present

## 2018-11-05 DIAGNOSIS — J439 Emphysema, unspecified: Secondary | ICD-10-CM | POA: Diagnosis not present

## 2018-11-05 DIAGNOSIS — I739 Peripheral vascular disease, unspecified: Secondary | ICD-10-CM | POA: Diagnosis not present

## 2018-11-05 DIAGNOSIS — S82491D Other fracture of shaft of right fibula, subsequent encounter for closed fracture with routine healing: Secondary | ICD-10-CM | POA: Diagnosis not present

## 2018-11-05 DIAGNOSIS — F331 Major depressive disorder, recurrent, moderate: Secondary | ICD-10-CM | POA: Diagnosis not present

## 2018-11-06 ENCOUNTER — Ambulatory Visit (INDEPENDENT_AMBULATORY_CARE_PROVIDER_SITE_OTHER): Payer: Medicare HMO

## 2018-11-06 ENCOUNTER — Other Ambulatory Visit: Payer: Self-pay

## 2018-11-06 DIAGNOSIS — S82851D Displaced trimalleolar fracture of right lower leg, subsequent encounter for closed fracture with routine healing: Secondary | ICD-10-CM

## 2018-11-06 DIAGNOSIS — M797 Fibromyalgia: Secondary | ICD-10-CM | POA: Diagnosis not present

## 2018-11-06 DIAGNOSIS — I2510 Atherosclerotic heart disease of native coronary artery without angina pectoris: Secondary | ICD-10-CM

## 2018-11-06 DIAGNOSIS — I252 Old myocardial infarction: Secondary | ICD-10-CM

## 2018-11-06 DIAGNOSIS — Z9181 History of falling: Secondary | ICD-10-CM

## 2018-11-06 DIAGNOSIS — S82491D Other fracture of shaft of right fibula, subsequent encounter for closed fracture with routine healing: Secondary | ICD-10-CM | POA: Diagnosis not present

## 2018-11-06 DIAGNOSIS — M4317 Spondylolisthesis, lumbosacral region: Secondary | ICD-10-CM | POA: Diagnosis not present

## 2018-11-06 DIAGNOSIS — Z955 Presence of coronary angioplasty implant and graft: Secondary | ICD-10-CM

## 2018-11-06 DIAGNOSIS — K219 Gastro-esophageal reflux disease without esophagitis: Secondary | ICD-10-CM

## 2018-11-06 DIAGNOSIS — M1990 Unspecified osteoarthritis, unspecified site: Secondary | ICD-10-CM

## 2018-11-06 DIAGNOSIS — Z8701 Personal history of pneumonia (recurrent): Secondary | ICD-10-CM

## 2018-11-06 DIAGNOSIS — F17210 Nicotine dependence, cigarettes, uncomplicated: Secondary | ICD-10-CM

## 2018-11-06 DIAGNOSIS — I739 Peripheral vascular disease, unspecified: Secondary | ICD-10-CM

## 2018-11-06 DIAGNOSIS — I472 Ventricular tachycardia: Secondary | ICD-10-CM

## 2018-11-06 DIAGNOSIS — J439 Emphysema, unspecified: Secondary | ICD-10-CM

## 2018-11-06 DIAGNOSIS — Z993 Dependence on wheelchair: Secondary | ICD-10-CM

## 2018-11-06 DIAGNOSIS — E782 Mixed hyperlipidemia: Secondary | ICD-10-CM

## 2018-11-06 DIAGNOSIS — Z7901 Long term (current) use of anticoagulants: Secondary | ICD-10-CM

## 2018-11-06 DIAGNOSIS — F331 Major depressive disorder, recurrent, moderate: Secondary | ICD-10-CM

## 2018-11-06 DIAGNOSIS — Z8673 Personal history of transient ischemic attack (TIA), and cerebral infarction without residual deficits: Secondary | ICD-10-CM

## 2018-11-06 DIAGNOSIS — Z6841 Body Mass Index (BMI) 40.0 and over, adult: Secondary | ICD-10-CM

## 2018-11-06 DIAGNOSIS — Z86718 Personal history of other venous thrombosis and embolism: Secondary | ICD-10-CM

## 2018-11-06 DIAGNOSIS — Z79891 Long term (current) use of opiate analgesic: Secondary | ICD-10-CM

## 2018-11-15 DIAGNOSIS — M25511 Pain in right shoulder: Secondary | ICD-10-CM | POA: Diagnosis not present

## 2018-11-15 DIAGNOSIS — E7800 Pure hypercholesterolemia, unspecified: Secondary | ICD-10-CM | POA: Diagnosis not present

## 2018-11-15 DIAGNOSIS — K219 Gastro-esophageal reflux disease without esophagitis: Secondary | ICD-10-CM | POA: Diagnosis not present

## 2018-11-15 DIAGNOSIS — I252 Old myocardial infarction: Secondary | ICD-10-CM | POA: Diagnosis not present

## 2018-11-15 DIAGNOSIS — Z885 Allergy status to narcotic agent status: Secondary | ICD-10-CM | POA: Diagnosis not present

## 2018-11-15 DIAGNOSIS — Z8673 Personal history of transient ischemic attack (TIA), and cerebral infarction without residual deficits: Secondary | ICD-10-CM | POA: Diagnosis not present

## 2018-11-15 DIAGNOSIS — R079 Chest pain, unspecified: Secondary | ICD-10-CM | POA: Diagnosis not present

## 2018-11-15 DIAGNOSIS — M25512 Pain in left shoulder: Secondary | ICD-10-CM | POA: Diagnosis not present

## 2018-11-15 DIAGNOSIS — Z888 Allergy status to other drugs, medicaments and biological substances status: Secondary | ICD-10-CM | POA: Diagnosis not present

## 2018-11-15 DIAGNOSIS — F17210 Nicotine dependence, cigarettes, uncomplicated: Secondary | ICD-10-CM | POA: Diagnosis not present

## 2018-11-15 DIAGNOSIS — G8911 Acute pain due to trauma: Secondary | ICD-10-CM | POA: Diagnosis not present

## 2018-11-15 DIAGNOSIS — S40022A Contusion of left upper arm, initial encounter: Secondary | ICD-10-CM | POA: Diagnosis not present

## 2018-11-18 ENCOUNTER — Other Ambulatory Visit: Payer: Self-pay

## 2018-11-18 DIAGNOSIS — I739 Peripheral vascular disease, unspecified: Secondary | ICD-10-CM | POA: Diagnosis not present

## 2018-11-18 DIAGNOSIS — F331 Major depressive disorder, recurrent, moderate: Secondary | ICD-10-CM | POA: Diagnosis not present

## 2018-11-18 DIAGNOSIS — M1990 Unspecified osteoarthritis, unspecified site: Secondary | ICD-10-CM | POA: Diagnosis not present

## 2018-11-18 DIAGNOSIS — S82851D Displaced trimalleolar fracture of right lower leg, subsequent encounter for closed fracture with routine healing: Secondary | ICD-10-CM | POA: Diagnosis not present

## 2018-11-18 DIAGNOSIS — J439 Emphysema, unspecified: Secondary | ICD-10-CM | POA: Diagnosis not present

## 2018-11-18 DIAGNOSIS — M797 Fibromyalgia: Secondary | ICD-10-CM | POA: Diagnosis not present

## 2018-11-18 DIAGNOSIS — I2510 Atherosclerotic heart disease of native coronary artery without angina pectoris: Secondary | ICD-10-CM | POA: Diagnosis not present

## 2018-11-18 DIAGNOSIS — M4317 Spondylolisthesis, lumbosacral region: Secondary | ICD-10-CM | POA: Diagnosis not present

## 2018-11-18 DIAGNOSIS — S82491D Other fracture of shaft of right fibula, subsequent encounter for closed fracture with routine healing: Secondary | ICD-10-CM | POA: Diagnosis not present

## 2018-11-19 ENCOUNTER — Encounter: Payer: Self-pay | Admitting: Family Medicine

## 2018-11-19 ENCOUNTER — Other Ambulatory Visit: Payer: Self-pay | Admitting: Family Medicine

## 2018-11-19 ENCOUNTER — Ambulatory Visit (INDEPENDENT_AMBULATORY_CARE_PROVIDER_SITE_OTHER): Payer: Medicare HMO

## 2018-11-19 ENCOUNTER — Ambulatory Visit (INDEPENDENT_AMBULATORY_CARE_PROVIDER_SITE_OTHER): Payer: Medicare HMO | Admitting: Family Medicine

## 2018-11-19 VITALS — BP 100/72 | HR 83 | Temp 96.7°F | Resp 20 | Ht 67.0 in | Wt 230.0 lb

## 2018-11-19 DIAGNOSIS — M25552 Pain in left hip: Secondary | ICD-10-CM | POA: Diagnosis not present

## 2018-11-19 DIAGNOSIS — S79912A Unspecified injury of left hip, initial encounter: Secondary | ICD-10-CM | POA: Diagnosis not present

## 2018-11-19 DIAGNOSIS — S82851A Displaced trimalleolar fracture of right lower leg, initial encounter for closed fracture: Secondary | ICD-10-CM | POA: Diagnosis not present

## 2018-11-19 DIAGNOSIS — F5101 Primary insomnia: Secondary | ICD-10-CM

## 2018-11-19 DIAGNOSIS — F331 Major depressive disorder, recurrent, moderate: Secondary | ICD-10-CM

## 2018-11-19 MED ORDER — TRAZODONE HCL 150 MG PO TABS: ORAL_TABLET | ORAL | 5 refills | Status: DC

## 2018-11-19 MED ORDER — TIZANIDINE HCL 6 MG PO CAPS: 6.0000 mg | ORAL_CAPSULE | Freq: Three times a day (TID) | ORAL | 1 refills | Status: DC | PRN

## 2018-11-19 MED ORDER — DULOXETINE HCL 60 MG PO CPEP: 60.0000 mg | ORAL_CAPSULE | Freq: Every day | ORAL | 1 refills | Status: DC

## 2018-11-19 MED ORDER — NABUMETONE 500 MG PO TABS: 1000.0000 mg | ORAL_TABLET | Freq: Two times a day (BID) | ORAL | 1 refills | Status: DC
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
## 2018-11-20 ENCOUNTER — Ambulatory Visit (INDEPENDENT_AMBULATORY_CARE_PROVIDER_SITE_OTHER): Payer: Medicare HMO | Admitting: Licensed Clinical Social Worker

## 2018-11-20 DIAGNOSIS — M1990 Unspecified osteoarthritis, unspecified site: Secondary | ICD-10-CM

## 2018-11-20 DIAGNOSIS — Z9861 Coronary angioplasty status: Secondary | ICD-10-CM

## 2018-11-20 DIAGNOSIS — Z86718 Personal history of other venous thrombosis and embolism: Secondary | ICD-10-CM

## 2018-11-20 DIAGNOSIS — Z8673 Personal history of transient ischemic attack (TIA), and cerebral infarction without residual deficits: Secondary | ICD-10-CM

## 2018-11-20 DIAGNOSIS — F331 Major depressive disorder, recurrent, moderate: Secondary | ICD-10-CM

## 2018-11-20 DIAGNOSIS — J439 Emphysema, unspecified: Secondary | ICD-10-CM

## 2018-11-20 DIAGNOSIS — I2510 Atherosclerotic heart disease of native coronary artery without angina pectoris: Secondary | ICD-10-CM | POA: Diagnosis not present

## 2018-11-20 NOTE — Patient Instructions (Addendum)
Licensed Clinical Social Worker Visit Information  Goals we discussed today:  Goals        . Client will talk with LCSW in next 30 days to discuss community resources of help in area (pt-stated)     Current Barriers:  [name] Mobility challenges (uses wheelchair to ambulate . Needs help with ADLs  Clinical Social Work Clinical Goal(s):  . Client and LCSW to talk in next 30 days to discuss community resources of help to client  Interventions: . Talked with Cally about her recent surgery on right ankle . Talked with Melek about her mobility challenges (uses wheelchair) . Talked previously with [name] about in home support services through ADTS (LCSW provided [place] with number for [employer]) . Talked with Tarshia about transport needs of client . Talked with Fallyn about social support network (receives support from her son, [name],; receives support from grandson, and receives some help from her daughter)  . Talked with [name] about pain issues of client  Patient Self Care Activities:  . Takes medications as prescribed . Attends scheduled medical appointments   Plan:  LCSW to call client in next 3 weeks to talk with her about community resources of help to client Client to attends scheduled medical appointments Client to communicate with RNCM as needed to discuss nursing needs Client to participate in physical therapy sessions as scheduled .   Initial goal documentation          Materials Provided: No  Follow Up Plan: LCSW to call client in next 3 weeks to talk with her about community resources   of help to client  The patient verbalized understanding of instructions provided today and declined a print copy of patient instruction materials.   Norva Riffle.Finlee Milo MSW, LCSW Licensed Clinical Social Worker [facility] Care [facility] [phone]

## 2018-11-20 NOTE — Chronic Care Management (AMB) (Addendum)
Care Management Note   Lindsay Bradford is a 58 y.o. year old female who is a primary care patient of Sharion Balloon, FNP. The CM team was consulted for assistance with chronic disease management and care coordination.   I reached out to [employer] by phone today.    Review of patient status, including review of consultants reports, relevant laboratory and other test results, and collaboration with appropriate care team members and the patient's provider was performed as part of comprehensive patient evaluation and provision of chronic care management services.   Social Determinants of health : risk of social isolation; risk of tobacco use; risk of physical inactivity; Risk of food insecurity    Office Visit from 11/19/2018 in [place]  PHQ-9 Total Score  18     GAD 7 : Generalized Anxiety Score 10/30/2018  Nervous, Anxious, on Edge 1  Control/stop worrying 1  Worry too much - different things 1  Trouble relaxing 1  Restless 1  Easily annoyed or irritable 0  Afraid - awful might happen 0  Total GAD 7 Score 5  Anxiety Difficulty Somewhat difficult    Medications    albuterol (VENTOLIN HFA) 108 (90 Base) MCG/ACT inhaler    baclofen (LIORESAL) 20 MG tablet    carvedilol (COREG) 6.25 MG tablet    DULoxetine (CYMBALTA) 60 MG capsule    HYDROcodone-acetaminophen (NORCO/VICODIN) 5-325 MG tablet    nabumetone (RELAFEN) 500 MG tablet    nitroGLYCERIN (NITROSTAT) 0.4 MG SL tablet    pantoprazole (PROTONIX) 40 MG tablet    rosuvastatin (CRESTOR) 40 MG tablet    tiotropium (SPIRIVA HANDIHALER) 18 MCG inhalation capsule    topiramate (TOPAMAX) 100 MG tablet    traZODone (DESYREL) 150 MG tablet    triamcinolone cream (KENALOG) 0.1 %    valACYclovir (VALTREX) 1000 MG tablet    venlafaxine XR (EFFEXOR-XR) 150 MG 24 hr capsule    XARELTO 20 MG TABS tablet     Goals    . Client will talk with LCSW in next 30 days to discuss community resources of help in area  (pt-stated)     Current Barriers:  [name] Mobility challenges (uses wheelchair to ambulate . Needs help with ADLs  Clinical Social Work Clinical Goal(s):  . Client and LCSW to talk in next 30 days to discuss community resources of help to client  Interventions: . Talked with Dondrea about her recent surgery on right ankle . Talked with Soul about her mobility challenges (uses wheelchair) . Talked previously with [name] about in home support services through [employer]TS (LCSW provided [place] with number for [employer]) . Talked with Freja about transport needs of client (discussed transport benefit with [employer]) . Talked with Christeena about social support network (receives support from her son, [name],; receives support from grandson, and receives some help from her daughter)  . Talked with [name] about pain issues of client  Patient Self Care Activities:  . Takes medications as prescribed . Attends scheduled medical appointments  Plan:  LCSW to call client in next 3 weeks to talk with her about community resources of help to client Client to attends scheduled medical appointments Client to communicate with RNCM as needed to discuss nursing needs Client to participate in physical therapy sessions as scheduled .   Initial goal documentation        Follow Up Plan: LCSW to call client in next 3 weeks to talk with client about community resources of help to client  Norva Riffle.Ashanty Coltrane MSW, LCSW Licensed Clinical Social Worker [facility] [phone]  I have reviewed and agree with the above documentation.   Evelina Dun, FNP

## 2018-11-20 NOTE — Telephone Encounter (Signed)
Pharmacy comment:  Alternative Requested: 6 MG CAPSULES NOT COVERED BY INSURANCE, TIZANIDINE 2MG  TABS ARE COVERED.

## 2018-11-24 ENCOUNTER — Other Ambulatory Visit: Payer: Self-pay | Admitting: Family

## 2018-11-24 DIAGNOSIS — F331 Major depressive disorder, recurrent, moderate: Secondary | ICD-10-CM

## 2018-11-26 DIAGNOSIS — M25512 Pain in left shoulder: Secondary | ICD-10-CM | POA: Diagnosis not present

## 2018-11-26 DIAGNOSIS — S82851A Displaced trimalleolar fracture of right lower leg, initial encounter for closed fracture: Secondary | ICD-10-CM | POA: Diagnosis not present

## 2018-11-28 DIAGNOSIS — S82491D Other fracture of shaft of right fibula, subsequent encounter for closed fracture with routine healing: Secondary | ICD-10-CM | POA: Diagnosis not present

## 2018-11-28 DIAGNOSIS — M4317 Spondylolisthesis, lumbosacral region: Secondary | ICD-10-CM | POA: Diagnosis not present

## 2018-11-28 DIAGNOSIS — I739 Peripheral vascular disease, unspecified: Secondary | ICD-10-CM | POA: Diagnosis not present

## 2018-11-28 DIAGNOSIS — M797 Fibromyalgia: Secondary | ICD-10-CM | POA: Diagnosis not present

## 2018-11-28 DIAGNOSIS — I2510 Atherosclerotic heart disease of native coronary artery without angina pectoris: Secondary | ICD-10-CM | POA: Diagnosis not present

## 2018-11-28 DIAGNOSIS — F331 Major depressive disorder, recurrent, moderate: Secondary | ICD-10-CM | POA: Diagnosis not present

## 2018-11-28 DIAGNOSIS — J439 Emphysema, unspecified: Secondary | ICD-10-CM | POA: Diagnosis not present

## 2018-11-28 DIAGNOSIS — S82851D Displaced trimalleolar fracture of right lower leg, subsequent encounter for closed fracture with routine healing: Secondary | ICD-10-CM | POA: Diagnosis not present

## 2018-11-28 DIAGNOSIS — M1990 Unspecified osteoarthritis, unspecified site: Secondary | ICD-10-CM | POA: Diagnosis not present

## 2018-12-02 ENCOUNTER — Telehealth: Payer: Self-pay | Admitting: Family

## 2018-12-02 MED ORDER — VENLAFAXINE HCL ER 150 MG PO TB24: 1.0000 | ORAL_TABLET | Freq: Every day | ORAL | 0 refills | Status: DC

## 2018-12-02 NOTE — Telephone Encounter (Signed)
Sent in TAB and cancelled CAP

## 2018-12-04 ENCOUNTER — Telehealth: Payer: Self-pay | Admitting: *Deleted

## 2018-12-04 ENCOUNTER — Other Ambulatory Visit: Payer: Self-pay

## 2018-12-04 ENCOUNTER — Other Ambulatory Visit: Payer: Self-pay | Admitting: *Deleted

## 2018-12-04 DIAGNOSIS — R55 Syncope and collapse: Secondary | ICD-10-CM | POA: Diagnosis not present

## 2018-12-04 NOTE — Telephone Encounter (Signed)
Patient aware and appt scheduled.

## 2018-12-04 NOTE — Telephone Encounter (Signed)
Patient called in and states that she was delirious, not seeing things correctly and just not herself. Patient states that she had take nabumetone, hydrocodone, oxycodone (a friend gave her 5 of these), and lyrica. Patient states she took herself off of all of them on Sunday and is feeling better now. Patient wants to know if you need to see her.

## 2018-12-04 NOTE — Telephone Encounter (Signed)
It would be best for her to be seen so we can check her VS and lab work. Continue to hold medication.

## 2018-12-05 ENCOUNTER — Ambulatory Visit: Payer: Medicare HMO | Admitting: Licensed Clinical Social Worker

## 2018-12-05 ENCOUNTER — Encounter: Payer: Self-pay | Admitting: Family

## 2018-12-05 ENCOUNTER — Other Ambulatory Visit: Payer: Self-pay | Admitting: *Deleted

## 2018-12-05 ENCOUNTER — Ambulatory Visit (INDEPENDENT_AMBULATORY_CARE_PROVIDER_SITE_OTHER): Payer: Medicare HMO | Admitting: Family

## 2018-12-05 VITALS — BP 110/76 | HR 98 | Temp 95.7°F | Ht 67.0 in

## 2018-12-05 DIAGNOSIS — E785 Hyperlipidemia, unspecified: Secondary | ICD-10-CM

## 2018-12-05 DIAGNOSIS — D6851 Activated protein C resistance: Secondary | ICD-10-CM

## 2018-12-05 DIAGNOSIS — J439 Emphysema, unspecified: Secondary | ICD-10-CM | POA: Diagnosis not present

## 2018-12-05 DIAGNOSIS — M1990 Unspecified osteoarthritis, unspecified site: Secondary | ICD-10-CM

## 2018-12-05 DIAGNOSIS — K219 Gastro-esophageal reflux disease without esophagitis: Secondary | ICD-10-CM | POA: Insufficient documentation

## 2018-12-05 DIAGNOSIS — Z8673 Personal history of transient ischemic attack (TIA), and cerebral infarction without residual deficits: Secondary | ICD-10-CM

## 2018-12-05 DIAGNOSIS — I2510 Atherosclerotic heart disease of native coronary artery without angina pectoris: Secondary | ICD-10-CM | POA: Diagnosis not present

## 2018-12-05 DIAGNOSIS — Z4509 Encounter for adjustment and management of other cardiac device: Secondary | ICD-10-CM | POA: Diagnosis not present

## 2018-12-05 DIAGNOSIS — Z86718 Personal history of other venous thrombosis and embolism: Secondary | ICD-10-CM

## 2018-12-05 DIAGNOSIS — F331 Major depressive disorder, recurrent, moderate: Secondary | ICD-10-CM

## 2018-12-05 NOTE — Chronic Care Management (AMB) (Addendum)
Care Management Note   Lindsay Bradford is a 58 y.o. year old female who is a primary care patient of Lindsay Balloon, FNP. The CM team was consulted for assistance with chronic disease management and care coordination.   I reached out to [employer] by phone today.   Review of patient status, including review of consultants reports, relevant laboratory and other test results, and collaboration with appropriate care team members and the patient's provider was performed as part of comprehensive patient evaluation and provision of chronic care management services.   Social  Determinants of Health: risk of social isolation; risk of tobacco use; risk of financial strain; risk of physical inactivity;risk of food insecurity    Office Visit from 11/19/2018 in [place]  PHQ-9 Total Score  18     GAD 7 : Generalized Anxiety Score 10/30/2018  Nervous, Anxious, on Edge 1  Control/stop worrying 1  Worry too much - different things 1  Trouble relaxing 1  Restless 1  Easily annoyed or irritable 0  Afraid - awful might happen 0  Total GAD 7 Score 5  Anxiety Difficulty Somewhat difficult   Medications    albuterol (VENTOLIN HFA) 108 (90 Base) MCG/ACT inhaler    baclofen (LIORESAL) 20 MG tablet    carvedilol (COREG) 6.25 MG tablet    DULoxetine (CYMBALTA) 60 MG capsule    gabapentin (NEURONTIN) 100 MG capsule    HYDROcodone-acetaminophen (NORCO/VICODIN) 5-325 MG tablet    linezolid (ZYVOX) 600 MG tablet    nabumetone (RELAFEN) 500 MG tablet    nitroGLYCERIN (NITROSTAT) 0.4 MG SL tablet    pantoprazole (PROTONIX) 40 MG tablet    pregabalin (LYRICA) 75 MG capsule    rosuvastatin (CRESTOR) 40 MG tablet    tiotropium (SPIRIVA HANDIHALER) 18 MCG inhalation capsule    topiramate (TOPAMAX) 100 MG tablet    traZODone (DESYREL) 150 MG tablet    triamcinolone cream (KENALOG) 0.1 %    valACYclovir (VALTREX) 1000 MG tablet    Venlafaxine HCl 150 MG TB24    XARELTO 20 MG  TABS tablet      GOAL:  . Client will talk with LCSW in next 30 days to discuss community resources of help in area (pt-stated)     Current Barriers:  [name] Mobility challenges (uses wheelchair to ambulate . Needs help with ADLs  Clinical Social Work Clinical Goal(s):  . Client and LCSW to talk in next 30 days to discuss community resources of help to client  Interventions: . Talked previously with Lindsay Bradford about CCM program and services . Talked previously with Lindsay Bradford about her recent surgery on right ankle . Previously talked with Lindsay Bradford about her mobility challenges (uses wheelchair) . Talked previously with Lindsay Bradford about in home support services through [facility] (LCSW provided [place] with number for [employer]) . Previously talked with Lindsay Bradford about transport needs of client . Talked previously with Lindsay Bradford about social support network (receives support from her son, Lindsay Bradford,; receives support from grandson, and receives some help from her daughter)   Patient Self Care Activities:  . Takes medications as prescribed . Attends scheduled medical appointments   Plan:  LCSW to call client in next 3 weeks to talk with her about community resources of help to client Client to attends scheduled medical appointments Client to communicate with RNCM as needed to discuss nursing needs Client to participate in physical therapy sessions as scheduled  Initial goal documentation        Follow Up Plan: LCSW  to call client in next 3 weeks to talk with her about community resources of help to client  [name].[name] MSW, LCSW Licensed Clinical Social Worker [facility] Care Management [phone]  I have reviewed and agree with the above AWV documentation.   Jannifer Rodney, FNP

## 2018-12-05 NOTE — Progress Notes (Signed)
Subjective:    Patient ID: Lindsay Bradford, female    DOB: 1960/03/28, 58 y.o.   MRN: [id number]  Chief Complaint  Patient presents with   Medical Management of Chronic Issues   Pt presents to the office today for chronic follow up.  Pt is followed by Cardiologists every 3 months for hx MI in 2000 , Hx CVA, PVD, isand NSVT. She does take xarelto daily because she has hx of DVT with Leiden.   She is currently seeing Ortho for broken right ankle after falling down stairs. She is currently working with PT once a week.   She states she took some oxycodone that a friend gave her with her other medications. She states since Sunday she has been "foggy". She is not unsure of all the medications she is taking. She reports since Sunday she and not taken any oxycodone, baclofen, lyrica. She did take one Norco this AM.  Hyperlipidemia This is a chronic problem. The current episode started more than 1 year ago. The problem is controlled. Recent lipid tests were reviewed and are normal. Exacerbating diseases include obesity. Current antihyperlipidemic treatment includes statins. The current treatment provides moderate improvement of lipids.  Depression        This is a chronic problem.  The current episode started more than 1 year ago.   The onset quality is gradual.   The problem occurs intermittently.The problem is unchanged.  Associated symptoms include helplessness, hopelessness, irritable, restlessness, decreased interest and sad.  Past treatments include nothing. Arthritis Presents for follow-up visit. She complains of pain and joint swelling. The symptoms have been stable. Affected locations include the left hip and right hip. Her pain is at a severity of 4/10.  Ankle Pain  The incident occurred more than 1 week ago. The pain is present in the right ankle. The pain is at a severity of 3/10. The pain is moderate. The pain has been fluctuating since onset. She has tried acetaminophen and non-weight  bearing for the symptoms. The treatment provided mild relief.  Gastroesophageal Reflux She complains of heartburn and a hoarse voice. She reports no belching or no coughing. This is a chronic problem. She has tried a PPI for the symptoms. The treatment provided moderate relief.  COPD Pt continues to smoke about a pack a day. She uses spirva daily    Review of Systems  HENT: Positive for hoarse voice.   Respiratory: Negative for cough.   Gastrointestinal: Positive for heartburn.  Musculoskeletal: Positive for arthritis and joint swelling.  Psychiatric/Behavioral: Positive for depression.       Objective:   Physical Exam Vitals signs reviewed.  Constitutional:      General: She is irritable. She is not in acute distress.    Appearance: She is well-developed.  HENT:     Head: Normocephalic and atraumatic.  Eyes:     Pupils: Pupils are equal, round, and reactive to light.  Neck:     Musculoskeletal: Normal range of motion and neck supple.     Thyroid: No thyromegaly.  Cardiovascular:     Rate and Rhythm: Normal rate and regular rhythm.     Heart sounds: Normal heart sounds. No murmur.  Pulmonary:     Effort: Pulmonary effort is normal. No respiratory distress.     Breath sounds: Normal breath sounds. No wheezing.  Abdominal:     General: Bowel sounds are normal. There is no distension.     Palpations: Abdomen is soft.     Tenderness:  There is no abdominal tenderness.  Musculoskeletal:        General: Swelling present. No tenderness.     Right lower leg: Edema (trace) present.     Comments: Pain in right ankle with flexion and extension, incision on distal ankle mildly erythemas, pt currently taking antibiotic  Skin:    General: Skin is warm and dry.  Neurological:     Mental Status: She is alert and oriented to person, place, and time.     Cranial Nerves: No cranial nerve deficit.     Deep Tendon Reflexes: Reflexes are normal and symmetric.  Psychiatric:        Mood and  Affect: Affect is tearful.        Behavior: Behavior normal.        Thought Content: Thought content normal.        Judgment: Judgment normal.     BP 110/76    Pulse 98    Temp (!) 95.7 F (35.4 C) (Temporal)    Ht 5' 7"  (1.702 m)    SpO2 95%    BMI 36.02 kg/m      Assessment & Plan:  Lindsay Bradford comes in today with chief complaint of Medical Management of Chronic Issues   Diagnosis and orders addressed:  1. Coronary artery disease involving native coronary artery of native heart without angina pectoris - CMP14+EGFR - CBC with Differential/Platelet  2. Pulmonary emphysema, unspecified emphysema type ([place]) - CMP14+EGFR - CBC with Differential/Platelet  3. Arthritis - CMP14+EGFR - CBC with Differential/Platelet  4. Factor V Leiden ([place]) - CMP14+EGFR - CBC with Differential/Platelet  5. Dyslipidemia - CMP14+EGFR - CBC with Differential/Platelet  6. History of CVA (cerebrovascular accident) - CMP14+EGFR - CBC with Differential/Platelet  7. History of DVT (deep vein thrombosis)  - CMP14+EGFR - CBC with Differential/Platelet  8. Moderate episode of recurrent major depressive disorder (HCC) - CMP14+EGFR - CBC with Differential/Platelet  9. Morbid obesity ([place]) - CMP14+EGFR - CBC with Differential/Platelet  10. Gastroesophageal reflux disease, unspecified whether esophagitis present - CMP14+EGFR - CBC with Differential/Platelet   Labs pending Health Maintenance reviewed Diet and exercise encouraged  I will not change any medications at this time. She will go home and call us with an up dated list of medications that she is taking and which medications she is not taking. She has not been taking her Effexor, but is unsure if she has been taking her Cymbalta. If she has not been taking either one of these we will restart the Effexor. Discussed that she does need to make sure she starts the linezolid.   Follow up plan: 2 weeks    Evelina Dun, FNP

## 2018-12-05 NOTE — Patient Instructions (Signed)

## 2018-12-05 NOTE — Patient Instructions (Addendum)
Licensed Clinical Social Worker Visit Information  Goals we discussed today:  Goals        . Client will talk with LCSW in next 30 days to discuss community resources of help in area (pt-stated)     Current Barriers:  [name] Mobility challenges (uses wheelchair to ambulate . Needs help with ADLs  Clinical Social Work Clinical Goal(s):  . Client and LCSW to talk in next 30 days to discuss community resources of help to client  Interventions:  Talked previously with [name] about CCM program and services  Talked previously with [name] about her recent surgery on right ankle  Previously talked with [name] about her mobility challenges (uses wheelchair)  Talked previously with [name] about in home support services through [employer]TS (LCSW provided [place] with number for [employer]TS)  Previously talked with [name] about transport needs of client  Talked previously with [name] about social support network (receives support from her son, [name],; receives support from grandson, and receives some help from her daughter)   Patient Self Care Activities:  . Takes medications as prescribed . Attends scheduled medical appointments   Plan:  LCSW to call client in next 3 weeks to talk with her about community resources of help to client Client to attends scheduled medical appointments Client to communicate with RNCM as needed to discuss nursing needs Client to participate in physical therapy sessions as scheduled .   Initial goal documentation          Materials Provided: No  Follow Up Plan: LCSW to call client in next 3 weeks to talk with client about community resources of help to client  The patient verbalized understanding of instructions provided today and declined a print copy of patient instruction materials.   Norva Riffle.Kimsey Demaree MSW, LCSW Licensed Clinical Social Worker [facility] Care Management [phone]

## 2018-12-06 ENCOUNTER — Emergency Department (HOSPITAL_COMMUNITY)
Admission: EM | Admit: 2018-12-06 | Discharge: 2018-12-07 | Discharge status: Against Medical Advice | Payer: Medicare HMO | Attending: Emergency Medicine | Admitting: Emergency Medicine

## 2018-12-06 ENCOUNTER — Other Ambulatory Visit: Payer: Self-pay

## 2018-12-06 ENCOUNTER — Emergency Department (HOSPITAL_COMMUNITY): Payer: Medicare HMO

## 2018-12-06 ENCOUNTER — Encounter (HOSPITAL_COMMUNITY): Payer: Self-pay | Admitting: Emergency Medicine

## 2018-12-06 DIAGNOSIS — Z20828 Contact with and (suspected) exposure to other viral communicable diseases: Secondary | ICD-10-CM | POA: Diagnosis not present

## 2018-12-06 DIAGNOSIS — R05 Cough: Secondary | ICD-10-CM | POA: Diagnosis not present

## 2018-12-06 DIAGNOSIS — Z7901 Long term (current) use of anticoagulants: Secondary | ICD-10-CM | POA: Insufficient documentation

## 2018-12-06 DIAGNOSIS — Z955 Presence of coronary angioplasty implant and graft: Secondary | ICD-10-CM | POA: Diagnosis not present

## 2018-12-06 DIAGNOSIS — I2510 Atherosclerotic heart disease of native coronary artery without angina pectoris: Secondary | ICD-10-CM | POA: Diagnosis not present

## 2018-12-06 DIAGNOSIS — F17210 Nicotine dependence, cigarettes, uncomplicated: Secondary | ICD-10-CM | POA: Insufficient documentation

## 2018-12-06 DIAGNOSIS — J449 Chronic obstructive pulmonary disease, unspecified: Secondary | ICD-10-CM | POA: Insufficient documentation

## 2018-12-06 DIAGNOSIS — I959 Hypotension, unspecified: Secondary | ICD-10-CM | POA: Diagnosis not present

## 2018-12-06 DIAGNOSIS — Z95818 Presence of other cardiac implants and grafts: Secondary | ICD-10-CM | POA: Insufficient documentation

## 2018-12-06 DIAGNOSIS — R0689 Other abnormalities of breathing: Secondary | ICD-10-CM | POA: Diagnosis not present

## 2018-12-06 DIAGNOSIS — R079 Chest pain, unspecified: Secondary | ICD-10-CM | POA: Diagnosis not present

## 2018-12-06 DIAGNOSIS — Z5320 Procedure and treatment not carried out because of patient's decision for unspecified reasons: Secondary | ICD-10-CM | POA: Insufficient documentation

## 2018-12-06 DIAGNOSIS — Z79899 Other long term (current) drug therapy: Secondary | ICD-10-CM | POA: Diagnosis not present

## 2018-12-06 DIAGNOSIS — R413 Other amnesia: Secondary | ICD-10-CM | POA: Insufficient documentation

## 2018-12-06 DIAGNOSIS — R0902 Hypoxemia: Secondary | ICD-10-CM | POA: Diagnosis not present

## 2018-12-06 DIAGNOSIS — Z9114 Patient's other noncompliance with medication regimen: Secondary | ICD-10-CM | POA: Diagnosis not present

## 2018-12-06 DIAGNOSIS — R0789 Other chest pain: Secondary | ICD-10-CM | POA: Diagnosis not present

## 2018-12-06 LAB — CBC WITH DIFFERENTIAL/PLATELET
Abs Immature Granulocytes: 0 10*3/uL (ref 0.00–0.07)
Basophils Absolute: 0.1 10*3/uL (ref 0.0–0.2)
Basophils Absolute: 0.1 10*3/uL (ref 0.0–0.1)
Basophils Relative: 2 %
Basos: 2 %
EOS (ABSOLUTE): 0.4 10*3/uL (ref 0.0–0.4)
Eos: 7 %
Eosinophils Absolute: 0.5 10*3/uL (ref 0.0–0.5)
Eosinophils Relative: 8 %
HCT: 41.5 % (ref 36.0–46.0)
Hematocrit: 41.8 % (ref 34.0–46.6)
Hemoglobin: 14.5 g/dL (ref 11.1–15.9)
Hemoglobin: 13.4 g/dL (ref 12.0–15.0)
Immature Grans (Abs): 0 10*3/uL (ref 0.0–0.1)
Immature Granulocytes: 0 %
Immature Granulocytes: 0 %
Lymphocytes Absolute: 1.6 10*3/uL (ref 0.7–3.1)
Lymphocytes Relative: 34 %
Lymphs Abs: 2.1 10*3/uL (ref 0.7–4.0)
Lymphs: 31 %
MCH: 32.3 pg (ref 26.6–33.0)
MCH: 31.6 pg (ref 26.0–34.0)
MCHC: 34.7 g/dL (ref 31.5–35.7)
MCHC: 32.3 g/dL (ref 30.0–36.0)
MCV: 93 fL (ref 79–97)
MCV: 97.9 fL (ref 80.0–100.0)
Monocytes Absolute: 0.7 10*3/uL (ref 0.1–0.9)
Monocytes Absolute: 0.8 10*3/uL (ref 0.1–1.0)
Monocytes Relative: 12 %
Monocytes: 13 %
Neutro Abs: 2.8 10*3/uL (ref 1.7–7.7)
Neutrophils Absolute: 2.5 10*3/uL (ref 1.4–7.0)
Neutrophils Relative %: 44 %
Neutrophils: 47 %
Platelets: 298 10*3/uL (ref 150–450)
Platelets: 296 10*3/uL (ref 150–400)
RBC: 4.49 x10E6/uL (ref 3.77–5.28)
RBC: 4.24 MIL/uL (ref 3.87–5.11)
RDW: 13.8 % (ref 11.7–15.4)
RDW: 14.3 % (ref 11.5–15.5)
WBC: 5.2 10*3/uL (ref 3.4–10.8)
WBC: 6.2 10*3/uL (ref 4.0–10.5)
nRBC: 0 % (ref 0.0–0.2)

## 2018-12-06 LAB — COMPREHENSIVE METABOLIC PANEL
ALT: 16 U/L (ref 0–44)
AST: 25 U/L (ref 15–41)
Albumin: 3.1 g/dL — ABNORMAL LOW (ref 3.5–5.0)
Alkaline Phosphatase: 87 U/L (ref 38–126)
Anion gap: 6 (ref 5–15)
BUN: 7 mg/dL (ref 6–20)
CO2: 23 mmol/L (ref 22–32)
Calcium: 8.4 mg/dL — ABNORMAL LOW (ref 8.9–10.3)
Chloride: 108 mmol/L (ref 98–111)
Creatinine, Ser: 0.6 mg/dL (ref 0.44–1.00)
GFR calc Af Amer: >60 mL/min (ref >60)
GFR calc non Af Amer: >60 mL/min (ref >60)
Glucose, Bld: 94 mg/dL (ref 70–99)
Potassium: 3.5 mmol/L (ref 3.5–5.1)
Sodium: 137 mmol/L (ref 135–145)
Total Bilirubin: 0.8 mg/dL (ref 0.3–1.2)
Total Protein: 6.6 g/dL (ref 6.5–8.1)

## 2018-12-06 LAB — RAPID URINE DRUG SCREEN, HOSP PERFORMED
Amphetamines: NOT DETECTED
Barbiturates: NOT DETECTED
Benzodiazepines: NOT DETECTED
Cocaine: NOT DETECTED
Opiates: POSITIVE — AB
Tetrahydrocannabinol: NOT DETECTED

## 2018-12-06 LAB — CMP14+EGFR
ALT: 15 IU/L (ref 0–32)
AST: 24 IU/L (ref 0–40)
Albumin/Globulin Ratio: 1.3 (ref 1.2–2.2)
Albumin: 3.7 g/dL — ABNORMAL LOW (ref 3.8–4.9)
Alkaline Phosphatase: 111 IU/L (ref 39–117)
BUN/Creatinine Ratio: 6 — ABNORMAL LOW (ref 9–23)
BUN: 4 mg/dL — ABNORMAL LOW (ref 6–24)
Bilirubin Total: 0.5 mg/dL (ref 0.0–1.2)
CO2: 24 mmol/L (ref 20–29)
Calcium: 9 mg/dL (ref 8.7–10.2)
Chloride: 105 mmol/L (ref 96–106)
Creatinine, Ser: 0.71 mg/dL (ref 0.57–1.00)
GFR calc Af Amer: 110 mL/min/{1.73_m2} (ref >59)
GFR calc non Af Amer: 96 mL/min/{1.73_m2} (ref >59)
Globulin, Total: 2.8 g/dL (ref 1.5–4.5)
Glucose: 100 mg/dL — ABNORMAL HIGH (ref 65–99)
Potassium: 3.6 mmol/L (ref 3.5–5.2)
Sodium: 142 mmol/L (ref 134–144)
Total Protein: 6.5 g/dL (ref 6.0–8.5)

## 2018-12-06 LAB — TROPONIN I (HIGH SENSITIVITY)
Troponin I (High Sensitivity): 6 ng/L (ref <18)
Troponin I (High Sensitivity): 5 ng/L (ref <18)

## 2018-12-06 LAB — PROTIME-INR
INR: 2 — ABNORMAL HIGH (ref 0.8–1.2)
Prothrombin Time: 22.9 seconds — ABNORMAL HIGH (ref 11.4–15.2)

## 2018-12-06 LAB — D-DIMER, QUANTITATIVE: D-Dimer, Quant: 0.91 ug/mL-FEU — ABNORMAL HIGH (ref 0.00–0.50)

## 2018-12-06 NOTE — ED Triage Notes (Signed)
Patient had 1 nitro before EMS arrived and 4 81 mg ASA. Chest pain x 2 days. NSR on monitor.

## 2018-12-06 NOTE — ED Notes (Signed)
Patient's IV removed due to patient stating that the IV site was hurting. Patient's Iv was fine, flushed and intact but removed due to patient stating that the site was hurting.

## 2018-12-06 NOTE — ED Provider Notes (Signed)
  Face-to-face evaluation   History: She presents for evaluation of chest discomfort.  Discomfort ongoing for several days.  She noticed discomfort in her left arm today as well.She has a postoperative infection in her left leg requiring antibiotic treatment.  She reports missing several doses of Xarelto recently.  Examined by me at 1:10 AM and she states that her chest pain is completely resolved at this time.  She states she has a history of IV contrast dye allergy, "hives and itching." She has never had to be intubated for it.   Physical exam: Obese, alert and cooperative.  She appears comfortable.  Heart regular rate and rhythm without murmur lungs clear to auscultation.  Excoriations right lower leg, lateral which she indicates is a chronic wound for her.       Patient Vitals for the past 24 hrs:  BP Temp Temp src Pulse Resp SpO2 Height Weight  12/06/18 2310 - - - 80 (!) 21 92 % - -  12/06/18 2230 109/63 - - 80 (!) 24 96 % - -  12/06/18 2120 - - - 81 12 96 % - -  12/06/18 2110 - - - 82 (!) 26 95 % - -  12/06/18 2100 - - - 77 18 95 % - -  12/06/18 2015 - - - 74 18 97 % - -  12/06/18 2000 - - - 76 (!) 23 97 % - -  12/06/18 1949 118/84 98 F (36.7 C) Oral 78 18 100 % - -  12/06/18 1944 - - - - - - 5\' 7"  (1.702 m) 104.3 kg      Medical Decision Making: Nonspecific chest pain, doubt ACS, CHF or pneumonia.  Elevated D-dimer and patient not taking Xarelto, inadvertently missed several doses.  She is apparently on Xarelto because of factor V Leiden at risk for venous thromboembolism.  Screening D-dimer done because of chest pain is elevated at 0.91, with possible risk for PE.  CT angiogram PE ordered, requiring pretreatment with hydrocortisone and Benadryl.  I anticipate that there will not be a PE and she can be discharged after that.  CRITICAL CARE-no Performed by: Daleen Bo    Medical screening examination/treatment/procedure(s) were conducted as a shared visit with  non-physician practitioner(s) and myself.  I personally evaluated the patient during the encounter  2:20 AM-she decided to leave [place], before CT imaging has been performed.    Daleen Bo, MD 12/07/18 [phone]

## 2018-12-06 NOTE — ED Provider Notes (Signed)
[facility] EMERGENCY DEPARTMENT Provider Note   CSN: [id number] Arrival date & time: 12/06/18  1940     History   Chief Complaint Chief Complaint  Patient presents with   Chest Pain    HPI Lindsay Bradford is a 58 y.o. female with a past medical history of factor V Leiden, PE, DVT, stroke, MI, hyperlipidemia, COPD, fibromyalgia, GERD, who presents today for evaluation of chest pressure.  She reports that she first started having some slight chest pressure since Saturday.  Today the pressure started to involve her left arm also.  She reports that she has a cough at baseline, however has been coughing more with increased sputum production over the past week.  She was recently started on linezolid, had her first dose today for a suspected postoperative infection in her right leg.      She denies any diarrhea.  Denies any new abdominal pain.  She reports that on Sunday she threw away her remaining Lyrica.  Instead of taking her prescribed pain medication she reports that she was taking OxyContin 10 mg tablets and she had a total of 5 pills.  She tells me that she is unable to remember the past 10 days as she was "out of my mind" and she attributes this to taking the OxyContin and multiple pain medicines.  She reports that she has missed multiple doses of Eliquis recently.  She denies any dysuria, increased frequency or urgency.       HPI  Past Medical History:  Diagnosis Date   Arthritis    Asthma    Bursitis of left hip    COPD (chronic obstructive pulmonary disease) (HCC)    Coronary artery disease    Depression    DVT (deep venous thrombosis) (HCC)    left   Factor V Leiden (HCC)    Fibromyalgia    GERD (gastroesophageal reflux disease)    Headache    sesaonal   Herpes simplex type 2 infection    History of blood transfusion    Hyperlipidemia    Hypotension    90's, Normally; in pain normal; BP   Ischemic cardiomyopathy    MI (myocardial  infarction) ([place])    x 2 2000    Pneumonia    Stroke ([place]) 02/27/2018   no residual    Patient Active Problem List   Diagnosis Date Noted   GERD (gastroesophageal reflux disease) 12/05/2018   Closed trimalleolar fracture of ankle, right, initial encounter 10/18/2018   Closed displaced trimalleolar fracture of right lower leg 10/15/2018   Coronary artery disease involving native coronary artery of native heart 08/26/2018   Morbid obesity ([place]) 08/26/2018   NSVT (nonsustained ventricular tachycardia) ([place]) 06/18/2018   Ischemic cardiomyopathy 03/13/2018   S/P coronary artery stent placement 03/13/2018   Dyslipidemia 08/02/2017   Peripheral arterial disease ([place]) 06/06/2017   CAD S/P percutaneous coronary angioplasty 08/25/2016   Spondylolisthesis of lumbosacral region 01/14/2014   Moderate episode of recurrent major depressive disorder ([place]) 08/29/2013   Arthritis 04/05/2012   History of DVT (deep vein thrombosis) 02/02/2012   COPD with emphysema ([place]) 03/21/2011   Factor V Leiden ([place]) 03/21/2011   History of CVA (cerebrovascular accident) 03/21/2011    Past Surgical History:  Procedure Laterality Date   ABDOMINAL HYSTERECTOMY     APPENDECTOMY     CARDIAC CATHETERIZATION     COLONOSCOPY     FOOT SURGERY Bilateral    LOOP RECORDER IMPLANT  06/20/2018   [facility]    ORIF  ANKLE FRACTURE Right 10/18/2018   Procedure: OPEN REDUCTION INTERNAL FIXATION (ORIF) ANKLE FRACTURE;  Surgeon: Roby Lofts, MD;  Location: [facility] OR;  Service: Orthopedics;  Laterality: Right;   SHOULDER SURGERY Right    VEIN BYPASS SURGERY Left      OB History   No obstetric history on file.      Home Medications    Prior to Admission medications   Medication Sig Start Date End Date Taking? Authorizing Provider  albuterol (VENTOLIN HFA) 108 (90 Base) MCG/ACT inhaler TAKE 2 PUFFS BY MOUTH EVERY 6 HOURS AS NEEDED FOR WHEEZE OR SHORTNESS OF BREATH Patient taking differently:  Inhale 1 puff into the lungs every 6 (six) hours as needed for wheezing or shortness of breath.  09/10/18   [name], FNP  baclofen (LIORESAL) 20 MG tablet Please specify directions, refills and quantity Patient not taking: Reported on 12/05/2018 11/20/18   Mechele Claude, MD  carvedilol (COREG) 6.25 MG tablet Take 6.25 mg by mouth daily.  03/19/18   [provider]  HYDROcodone-acetaminophen (NORCO/VICODIN) 5-325 MG tablet Take 1 tablet by mouth every 4 (four) hours as needed. 10/22/18   Despina Hidden, PA-C  linezolid (ZYVOX) 600 MG tablet Take 600 mg by mouth 2 (two) times daily. 12/02/18   [provider]  nabumetone (RELAFEN) 500 MG tablet Take 2 tablets (1,000 mg total) by mouth 2 (two) times daily. For muscle and joint pain Patient not taking: Reported on 12/05/2018 11/19/18   Mechele Claude, MD  nitroGLYCERIN (NITROSTAT) 0.4 MG SL tablet Place 0.4 mg under the tongue every 5 (five) minutes as needed for chest pain.    [provider]  pantoprazole (PROTONIX) 40 MG tablet Take 1 tablet (40 mg total) by mouth daily. 08/26/18   [name], FNP  rosuvastatin (CRESTOR) 40 MG tablet Take 1 tablet (40 mg total) by mouth daily. 08/26/18   [name], FNP  tiotropium (SPIRIVA HANDIHALER) 18 MCG inhalation capsule Place 1 capsule (18 mcg total) into inhaler and inhale at bedtime. Patient taking differently: Place 18 mcg into inhaler and inhale daily as needed (shortness of breath).  08/26/18   Jannifer Rodney A, FNP  topiramate (TOPAMAX) 100 MG tablet Take 100 mg by mouth daily.    [provider]  traZODone (DESYREL) 150 MG tablet Use from 1/3 to 1 tablet nightly as needed for sleep. Patient not taking: Reported on 12/05/2018 11/19/18   Mechele Claude, MD  triamcinolone cream (KENALOG) 0.1 % APPLY TO AFFECTED AREA TWICE A DAY Patient taking differently: Apply 1 application topically 2 (two) times daily as needed (itching).  09/10/18   [name], FNP   valACYclovir (VALTREX) 1000 MG tablet Take 2,000 mg by mouth daily.    [provider]  Venlafaxine HCl 150 MG TB24 TAKE 1 TABLET BY MOUTH EVERY DAY 11/25/18   Hawks, Christy A, FNP  XARELTO 20 MG TABS tablet Take 1 tablet (20 mg total) by mouth daily. 08/26/18   [name], FNP    Family History Family History  Problem Relation Age of Onset   Arthritis Mother    Cancer Mother    Colon cancer Mother    Breast cancer Mother    COPD Father    COPD Brother    Heart disease Sister    COPD Brother    Stroke Brother    Heart disease Brother    Clotting disorder Brother    Colon cancer Maternal Grandmother  Cancer Maternal Grandmother    Breast cancer Maternal Grandmother    Cancer Paternal Grandmother    Colon cancer Paternal Grandmother    Cancer Nephew    Colon cancer Nephew     Social History Social History   Tobacco Use   Smoking status: Heavy Tobacco Smoker    Packs/day: 0.50    Years: 35.00    Pack years: 17.50    Types: Cigarettes   Smokeless tobacco: Never Used  Substance Use Topics   Alcohol use: Not Currently   Drug use: Not Currently     Allergies   Cephalexin, Clarithromycin, Ibuprofen, Other, Oxymorphone, Penicillins, Potassium chloride, Sulfa antibiotics, Clindamycin, Fentanyl, Naproxen, Iodinated diagnostic agents, Lyrica [pregabalin], and Tetracycline   Review of Systems Review of Systems  Constitutional: Positive for fatigue. Negative for chills and fever.  Respiratory: Positive for cough and chest tightness. Negative for shortness of breath.   Cardiovascular: Positive for chest pain. Negative for palpitations and leg swelling.  Gastrointestinal: Negative for abdominal pain, diarrhea, nausea and vomiting.  Skin:       Please see clinical image.  The area around her surgical site on the right leg is erythematous with scant areas of drainage.  No palpable fluctuance  All other systems reviewed and are  negative.    Physical Exam Updated Vital Signs BP 118/84 (BP Location: Right Arm)    Pulse 81    Temp 98 F (36.7 C) (Oral)    Resp 12    Ht  (1.702 m)    Wt 104.3 kg    SpO2 96%    BMI 36.02 kg/m   Physical Exam Vitals signs and nursing note reviewed.  Constitutional:      General: She is not in acute distress.    Appearance: She is well-developed. She is not diaphoretic.  HENT:     Head: Normocephalic and atraumatic.  Eyes:     General: No scleral icterus.       Right eye: No discharge.        Left eye: No discharge.     Conjunctiva/sclera: Conjunctivae normal.  Neck:     Musculoskeletal: Normal range of motion.     Vascular: No JVD.     Trachea: No tracheal deviation.  Cardiovascular:     Rate and Rhythm: Normal rate and regular rhythm.     Pulses:          Dorsalis pedis pulses are 2+ on the right side and 2+ on the left side.     Heart sounds: Normal heart sounds.  Pulmonary:     Effort: Pulmonary effort is normal. No tachypnea or respiratory distress.     Breath sounds: Normal breath sounds. No stridor.  Chest:     Chest wall: No tenderness or edema.  Abdominal:     General: Bowel sounds are normal. There is no distension.     Palpations: Abdomen is soft.     Tenderness: There is no abdominal tenderness.  Musculoskeletal:        General: No deformity.     Right lower leg: She exhibits no tenderness. No edema.     Left lower leg: She exhibits no tenderness. No edema.  Skin:    General: Skin is warm and dry.  Neurological:     General: No focal deficit present.     Mental Status: She is alert.     Cranial Nerves: No cranial nerve deficit.     Motor: No abnormal muscle tone.  Psychiatric:        Attention and Perception: She is inattentive.        Mood and Affect: Mood normal.        Speech: Speech is tangential.        Behavior: Behavior normal.        Cognition and Memory: She exhibits impaired recent memory.     Comments: Patient requires multiple  redirections in order to obtain history, speech is very tangential.          ED Treatments / Results  Labs (all labs ordered are listed, but only abnormal results are displayed) Labs Reviewed  COMPREHENSIVE METABOLIC PANEL - Abnormal; Notable for the following components:      Result Value   Calcium 8.4 (*)    Albumin 3.1 (*)    All other components within normal limits  PROTIME-INR - Abnormal; Notable for the following components:   Prothrombin Time 22.9 (*)    INR 2.0 (*)    All other components within normal limits  SARS CORONAVIRUS 2 (TAT 6-24 HRS)  CBC WITH DIFFERENTIAL/PLATELET  RAPID URINE DRUG SCREEN, HOSP PERFORMED  D-DIMER, QUANTITATIVE (NOT AT [facility])  TROPONIN I (HIGH SENSITIVITY)  TROPONIN I (HIGH SENSITIVITY)    EKG EKG Interpretation  Date/Time:  Friday December 06 2018 19:49:56 EST Ventricular Rate:  77 PR Interval:    QRS Duration: 91 QT Interval:  384 QTC Calculation: 435 R Axis:   19 Text Interpretation: Sinus rhythm Low voltage, extremity and precordial leads Probable anteroseptal infarct, old since last tracing no significant change Confirmed by Mancel Bale [phone]) on 12/06/2018 9:56:55 PM   Radiology Dg Chest Port 1 View  Result Date: 12/06/2018 CLINICAL DATA:  Chest pain for 2 days EXAM: PORTABLE CHEST 1 VIEW COMPARISON:  Radiograph 08/04/2018 FINDINGS: Rounded radiodensity in the periphery of the right lung base likely reflecting a calcified granuloma. Implantable loop recorder projects over the left lower chest. Streaky opacities in the bases likely reflect a combination of chronic interstitial change seen on prior studies and basilar atelectasis. No consolidation, features of edema, pneumothorax, or effusion. The aorta is calcified. The remaining cardiomediastinal contours are unremarkable. No acute osseous or soft tissue abnormality. Degenerative changes are present in the imaged spine and shoulders. IMPRESSION: Probable calcified granuloma in  the periphery of the right lung base. Basilar atelectasis.  Chronic coarsened interstitial changes. No other acute cardiopulmonary abnormality. Electronically Signed   By: Kreg Shropshire M.D.   On: 12/06/2018 20:59    Procedures Procedures (including critical care time)  Medications Ordered in ED Medications - No data to display   Initial Impression / Assessment and Plan / ED Course  I have reviewed the triage vital signs and the nursing notes.  Pertinent labs & imaging results that were available during my care of the patient were reviewed by me and considered in my medical decision making (see chart for details).       Patient presents today for evaluation of chest pressure radiating into the left arm for approximately 2 days.  She reports today the pain continued to worsen making her concerned of a heart attack.  On exam she is generally well-appearing and in no acute distress.  Labs are obtained and reviewed, initial troponin is 6.  CBC is unremarkable.  CMP has mild hypocalcemia at 8.4 however is otherwise unremarkable.  PT/INR is slightly elevated, she reports she has missed multiple doses of her Eliquis recently, and currently has her right foot in a  Cam walker due to a wound and is just started taking linezolid with her first dose today.  She has a history of factor V Leiden and prior PE/DVT revealing concern for recurrent PE/DVT.  D-dimer is pending.  At shift change care was transferred to Dr. Effie ShyWentz who will follow pending studies, re-evaulate and determine disposition.      Final Clinical Impressions(s) / ED Diagnoses   Final diagnoses:  Nonspecific chest pain    ED Discharge Orders    None       Norman ClayHammond, Lashaundra Lehrmann W, PA-C 12/06/18 2238    Mancel BaleWentz, [place], MD 12/20/18 1210

## 2018-12-07 ENCOUNTER — Emergency Department (HOSPITAL_COMMUNITY): Payer: Medicare HMO

## 2018-12-07 ENCOUNTER — Ambulatory Visit (HOSPITAL_COMMUNITY): Admission: RE | Admit: 2018-12-07 | Payer: Medicare HMO | Source: Ambulatory Visit

## 2018-12-07 LAB — SARS CORONAVIRUS 2 (TAT 6-24 HRS): SARS Coronavirus 2: NEGATIVE

## 2018-12-07 MED ORDER — HYDROCORTISONE NA SUCCINATE PF 250 MG IJ SOLR: 200.0000 mg | Freq: Once | INTRAMUSCULAR | Status: DC

## 2018-12-07 MED ORDER — HYDROCODONE-ACETAMINOPHEN 5-325 MG PO TABS: 1.0000 | ORAL_TABLET | Freq: Once | ORAL | Status: DC

## 2018-12-07 MED ORDER — HYDROCORTISONE NA SUCCINATE PF 100 MG IJ SOLR: 200.0000 mg | Freq: Once | INTRAMUSCULAR | Status: DC

## 2018-12-07 MED ORDER — DIPHENHYDRAMINE HCL 50 MG/ML IJ SOLN: 50.0000 mg | Freq: Once | INTRAMUSCULAR | Status: DC

## 2018-12-07 MED ORDER — RIVAROXABAN 20 MG PO TABS
20.0000 mg | ORAL_TABLET | Freq: Once | ORAL | Status: DC
  Filled 2018-12-07: qty 1

## 2018-12-07 MED ORDER — DIPHENHYDRAMINE HCL 25 MG PO CAPS: 50.0000 mg | ORAL_CAPSULE | Freq: Once | ORAL | Status: DC

## 2018-12-07 NOTE — ED Notes (Signed)
Patient left AMA.

## 2018-12-07 NOTE — ED Notes (Signed)
Patient refused any further treatment and refused CT. Dr. Eulis Foster went and spoke to the patient about the CT scan and then patient refused CT scan when the nurse Ruthy Dick RN went to start patient's IV.

## 2018-12-07 NOTE — ED Notes (Signed)
This nurse went in room to restart and Iv for a ct scan.  Pt requesting to speak to edp before having test.  Pt told edp that she would do the test, but when this nurse returned to room, pt was insistent that she did not need the test and that she would be following up with her primary care doctor anyway.  Pt informed this nurse that she did not have a ride home.  I offered for her to wait until morning when the cabs or scat bus start running again.  Pt very upset at this option, states she has no way home. Pt then on the phone with her Son who told her he could not come to pick her up.

## 2018-12-07 NOTE — ED Notes (Signed)
Patient taken to lobby in wheelchair waiting on her ride.

## 2018-12-09 ENCOUNTER — Telehealth: Payer: Self-pay | Admitting: Family

## 2018-12-09 NOTE — Telephone Encounter (Signed)
Patient statea that Alyse Low told her to call with a list of her medications since she did not have the list at her last visit.  Trazodone 150mg - 1 tablet at bedtime PRN Pantoprazole 40mg  daily valacyclovir  1 gram- 2 tablets daily xerleto 20mg  daily carvedilol 6.2mg - 1 tablet BID Rosuvastatin calcium 40mg  daily Asprin 81 daily Norco 5-325mg - PRN linezolid 600mg - BID- Per instructions on bottol needs to stop SSRIS medications- is this correct?

## 2018-12-10 ENCOUNTER — Emergency Department (HOSPITAL_COMMUNITY)
Admission: EM | Admit: 2018-12-10 | Discharge: 2018-12-12 | Disposition: A | Discharge status: Other Acute Inpatient Hospital | Payer: Medicare HMO | Attending: Emergency Medicine | Admitting: Emergency Medicine

## 2018-12-10 ENCOUNTER — Telehealth: Payer: Self-pay | Admitting: Family

## 2018-12-10 ENCOUNTER — Other Ambulatory Visit: Payer: Self-pay

## 2018-12-10 ENCOUNTER — Encounter (HOSPITAL_COMMUNITY): Payer: Self-pay | Admitting: Emergency Medicine

## 2018-12-10 ENCOUNTER — Other Ambulatory Visit: Payer: Self-pay | Admitting: Family

## 2018-12-10 DIAGNOSIS — I255 Ischemic cardiomyopathy: Secondary | ICD-10-CM | POA: Diagnosis not present

## 2018-12-10 DIAGNOSIS — Z20828 Contact with and (suspected) exposure to other viral communicable diseases: Secondary | ICD-10-CM | POA: Insufficient documentation

## 2018-12-10 DIAGNOSIS — Z79899 Other long term (current) drug therapy: Secondary | ICD-10-CM | POA: Diagnosis not present

## 2018-12-10 DIAGNOSIS — Z8673 Personal history of transient ischemic attack (TIA), and cerebral infarction without residual deficits: Secondary | ICD-10-CM | POA: Diagnosis not present

## 2018-12-10 DIAGNOSIS — F332 Major depressive disorder, recurrent severe without psychotic features: Secondary | ICD-10-CM | POA: Diagnosis present

## 2018-12-10 DIAGNOSIS — F29 Unspecified psychosis not due to a substance or known physiological condition: Secondary | ICD-10-CM | POA: Diagnosis not present

## 2018-12-10 DIAGNOSIS — J45909 Unspecified asthma, uncomplicated: Secondary | ICD-10-CM | POA: Insufficient documentation

## 2018-12-10 DIAGNOSIS — Z008 Encounter for other general examination: Secondary | ICD-10-CM

## 2018-12-10 DIAGNOSIS — Z955 Presence of coronary angioplasty implant and graft: Secondary | ICD-10-CM | POA: Diagnosis not present

## 2018-12-10 DIAGNOSIS — F17210 Nicotine dependence, cigarettes, uncomplicated: Secondary | ICD-10-CM | POA: Diagnosis not present

## 2018-12-10 DIAGNOSIS — R4689 Other symptoms and signs involving appearance and behavior: Secondary | ICD-10-CM | POA: Diagnosis present

## 2018-12-10 DIAGNOSIS — Z046 Encounter for general psychiatric examination, requested by authority: Secondary | ICD-10-CM

## 2018-12-10 DIAGNOSIS — Z03818 Encounter for observation for suspected exposure to other biological agents ruled out: Secondary | ICD-10-CM | POA: Diagnosis not present

## 2018-12-10 DIAGNOSIS — I2510 Atherosclerotic heart disease of native coronary artery without angina pectoris: Secondary | ICD-10-CM | POA: Diagnosis not present

## 2018-12-10 DIAGNOSIS — R45851 Suicidal ideations: Secondary | ICD-10-CM | POA: Diagnosis not present

## 2018-12-10 DIAGNOSIS — I252 Old myocardial infarction: Secondary | ICD-10-CM | POA: Diagnosis not present

## 2018-12-10 DIAGNOSIS — R072 Precordial pain: Secondary | ICD-10-CM | POA: Diagnosis not present

## 2018-12-10 DIAGNOSIS — J449 Chronic obstructive pulmonary disease, unspecified: Secondary | ICD-10-CM | POA: Diagnosis not present

## 2018-12-10 DIAGNOSIS — D6851 Activated protein C resistance: Secondary | ICD-10-CM | POA: Diagnosis not present

## 2018-12-10 LAB — COMPREHENSIVE METABOLIC PANEL
ALT: 24 U/L (ref 0–44)
AST: 41 U/L (ref 15–41)
Albumin: 4 g/dL (ref 3.5–5.0)
Alkaline Phosphatase: 102 U/L (ref 38–126)
Anion gap: 10 (ref 5–15)
BUN: 7 mg/dL (ref 6–20)
CO2: 25 mmol/L (ref 22–32)
Calcium: 8.8 mg/dL — ABNORMAL LOW (ref 8.9–10.3)
Chloride: 104 mmol/L (ref 98–111)
Creatinine, Ser: 0.74 mg/dL (ref 0.44–1.00)
GFR calc Af Amer: >60 mL/min (ref >60)
GFR calc non Af Amer: >60 mL/min (ref >60)
Glucose, Bld: 93 mg/dL (ref 70–99)
Potassium: 3.2 mmol/L — ABNORMAL LOW (ref 3.5–5.1)
Sodium: 139 mmol/L (ref 135–145)
Total Bilirubin: 0.7 mg/dL (ref 0.3–1.2)
Total Protein: 8 g/dL (ref 6.5–8.1)

## 2018-12-10 LAB — CBC WITH DIFFERENTIAL/PLATELET
Abs Immature Granulocytes: 0.01 10*3/uL (ref 0.00–0.07)
Basophils Absolute: 0.1 10*3/uL (ref 0.0–0.1)
Basophils Relative: 2 %
Eosinophils Absolute: 0.6 10*3/uL — ABNORMAL HIGH (ref 0.0–0.5)
Eosinophils Relative: 9 %
HCT: 46.6 % — ABNORMAL HIGH (ref 36.0–46.0)
Hemoglobin: 15.1 g/dL — ABNORMAL HIGH (ref 12.0–15.0)
Immature Granulocytes: 0 %
Lymphocytes Relative: 37 %
Lymphs Abs: 2.4 10*3/uL (ref 0.7–4.0)
MCH: 32 pg (ref 26.0–34.0)
MCHC: 32.4 g/dL (ref 30.0–36.0)
MCV: 98.7 fL (ref 80.0–100.0)
Monocytes Absolute: 0.6 10*3/uL (ref 0.1–1.0)
Monocytes Relative: 9 %
Neutro Abs: 2.8 10*3/uL (ref 1.7–7.7)
Neutrophils Relative %: 43 %
Platelets: 311 10*3/uL (ref 150–400)
RBC: 4.72 MIL/uL (ref 3.87–5.11)
RDW: 14.4 % (ref 11.5–15.5)
WBC: 6.4 10*3/uL (ref 4.0–10.5)
nRBC: 0 % (ref 0.0–0.2)

## 2018-12-10 LAB — RAPID URINE DRUG SCREEN, HOSP PERFORMED
Amphetamines: NOT DETECTED
Barbiturates: NOT DETECTED
Benzodiazepines: NOT DETECTED
Cocaine: NOT DETECTED
Opiates: NOT DETECTED
Tetrahydrocannabinol: NOT DETECTED

## 2018-12-10 LAB — ACETAMINOPHEN LEVEL: Acetaminophen (Tylenol), Serum: <10 ug/mL — ABNORMAL LOW (ref 10–30)

## 2018-12-10 LAB — SALICYLATE LEVEL: Salicylate Lvl: <7 mg/dL (ref 2.8–30.0)

## 2018-12-10 LAB — ETHANOL: Alcohol, Ethyl (B): <10 mg/dL (ref <10)

## 2018-12-10 MED ORDER — ONDANSETRON 4 MG PO TBDP
4.0000 mg | ORAL_TABLET | Freq: Once | ORAL | Status: AC
  Administered 2018-12-10: 4 mg via ORAL
  Filled 2018-12-10: qty 1

## 2018-12-10 MED ORDER — VENLAFAXINE HCL ER 37.5 MG PO CP24: ORAL_CAPSULE | ORAL | 0 refills | Status: DC

## 2018-12-10 MED ORDER — POTASSIUM CHLORIDE CRYS ER 20 MEQ PO TBCR
40.0000 meq | EXTENDED_RELEASE_TABLET | Freq: Once | ORAL | Status: AC
  Administered 2018-12-11: 40 meq via ORAL
  Filled 2018-12-10: qty 2
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
## 2018-12-10 NOTE — ED Notes (Signed)
Pt ready for her TTS. Will take to [facility]

## 2018-12-10 NOTE — Progress Notes (Signed)
Ok, I have updated her med list. I want her to  Effexor after she completes Linezolid. She will start the Effexor  37.5 mg for 2 weeks then increase to 75 mg. Please make her a follow up in 6 weeks to discuss anxiety and depression.
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
## 2018-12-10 NOTE — ED Notes (Signed)
One bag of belongings placed on 4th shelf of locker room
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
## 2018-12-10 NOTE — ED Notes (Signed)
Pt changing in to purple scrubs and providing urine sample

## 2018-12-10 NOTE — Progress Notes (Signed)
Patient aware and verbalizes understanding.  Apt scheduled.

## 2018-12-10 NOTE — ED Notes (Signed)
Pt calm and cooperative.

## 2018-12-10 NOTE — ED Notes (Signed)
Lab collected sample

## 2018-12-11 DIAGNOSIS — F29 Unspecified psychosis not due to a substance or known physiological condition: Secondary | ICD-10-CM | POA: Diagnosis not present

## 2018-12-11 MED ORDER — ROSUVASTATIN CALCIUM 40 MG PO TABS
40.0000 mg | ORAL_TABLET | Freq: Every day | ORAL | Status: DC
  Administered 2018-12-11 – 2018-12-12 (×2): 40 mg via ORAL
  Filled 2018-12-11 (×3): qty 1

## 2018-12-11 MED ORDER — ALBUTEROL SULFATE HFA 108 (90 BASE) MCG/ACT IN AERS
2.0000 | INHALATION_SPRAY | RESPIRATORY_TRACT | Status: DC | PRN
  Administered 2018-12-11: 2 via RESPIRATORY_TRACT
  Filled 2018-12-11: qty 6.7

## 2018-12-11 MED ORDER — CARVEDILOL 12.5 MG PO TABS
6.2500 mg | ORAL_TABLET | Freq: Every day | ORAL | Status: DC
  Administered 2018-12-11 – 2018-12-12 (×2): 6.25 mg via ORAL
  Filled 2018-12-11 (×2): qty 1

## 2018-12-11 MED ORDER — ACETAMINOPHEN 325 MG PO TABS
650.0000 mg | ORAL_TABLET | Freq: Four times a day (QID) | ORAL | Status: DC | PRN
  Administered 2018-12-11 – 2018-12-12 (×2): 650 mg via ORAL
  Filled 2018-12-11 (×2): qty 2

## 2018-12-11 MED ORDER — VENLAFAXINE HCL ER 37.5 MG PO CP24
37.5000 mg | ORAL_CAPSULE | Freq: Every day | ORAL | Status: DC
  Administered 2018-12-11 – 2018-12-12 (×2): 37.5 mg via ORAL
  Filled 2018-12-11: qty 1

## 2018-12-11 MED ORDER — PANTOPRAZOLE SODIUM 40 MG PO TBEC
40.0000 mg | DELAYED_RELEASE_TABLET | Freq: Every day | ORAL | Status: DC
  Administered 2018-12-11 – 2018-12-12 (×2): 40 mg via ORAL
  Filled 2018-12-11 (×2): qty 1

## 2018-12-11 MED ORDER — RIVAROXABAN 20 MG PO TABS
20.0000 mg | ORAL_TABLET | Freq: Every day | ORAL | Status: DC
  Administered 2018-12-11 – 2018-12-12 (×2): 20 mg via ORAL
  Filled 2018-12-11 (×3): qty 1

## 2018-12-11 NOTE — ED Notes (Signed)
Pt had appx 30 seconds of seizure like activity that stopped with painful stimuli. Pt was alert and oriented immediately after and stated that all she had was a bad dream.
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
## 2018-12-11 NOTE — ED Notes (Signed)
Beatrice Lupita Shutter- Patient's Daughter

## 2018-12-11 NOTE — ED Notes (Signed)
IVC papers faxed to [facility]

## 2018-12-12 ENCOUNTER — Other Ambulatory Visit: Payer: Self-pay | Admitting: Family Medicine

## 2018-12-12 ENCOUNTER — Other Ambulatory Visit: Payer: Self-pay

## 2018-12-12 ENCOUNTER — Inpatient Hospital Stay
Admission: RE | Admit: 2018-12-12 | Discharge: 2018-12-17 | DRG: 885 | Disposition: A | Discharge status: Home / Self Care | Payer: Medicare HMO | Source: Intra-hospital | Attending: Psychiatry | Admitting: Psychiatry

## 2018-12-12 DIAGNOSIS — F419 Anxiety disorder, unspecified: Secondary | ICD-10-CM | POA: Diagnosis present

## 2018-12-12 DIAGNOSIS — D6851 Activated protein C resistance: Secondary | ICD-10-CM | POA: Diagnosis present

## 2018-12-12 DIAGNOSIS — F332 Major depressive disorder, recurrent severe without psychotic features: Secondary | ICD-10-CM | POA: Diagnosis not present

## 2018-12-12 DIAGNOSIS — I2510 Atherosclerotic heart disease of native coronary artery without angina pectoris: Secondary | ICD-10-CM

## 2018-12-12 DIAGNOSIS — I10 Essential (primary) hypertension: Secondary | ICD-10-CM | POA: Diagnosis present

## 2018-12-12 DIAGNOSIS — J439 Emphysema, unspecified: Secondary | ICD-10-CM | POA: Diagnosis present

## 2018-12-12 DIAGNOSIS — Z79899 Other long term (current) drug therapy: Secondary | ICD-10-CM | POA: Diagnosis not present

## 2018-12-12 DIAGNOSIS — I255 Ischemic cardiomyopathy: Secondary | ICD-10-CM | POA: Diagnosis present

## 2018-12-12 DIAGNOSIS — F17210 Nicotine dependence, cigarettes, uncomplicated: Secondary | ICD-10-CM | POA: Diagnosis present

## 2018-12-12 DIAGNOSIS — J449 Chronic obstructive pulmonary disease, unspecified: Secondary | ICD-10-CM | POA: Diagnosis not present

## 2018-12-12 DIAGNOSIS — K219 Gastro-esophageal reflux disease without esophagitis: Secondary | ICD-10-CM | POA: Diagnosis present

## 2018-12-12 DIAGNOSIS — Z8673 Personal history of transient ischemic attack (TIA), and cerebral infarction without residual deficits: Secondary | ICD-10-CM

## 2018-12-12 DIAGNOSIS — R45851 Suicidal ideations: Secondary | ICD-10-CM | POA: Diagnosis not present

## 2018-12-12 DIAGNOSIS — J45909 Unspecified asthma, uncomplicated: Secondary | ICD-10-CM | POA: Diagnosis not present

## 2018-12-12 DIAGNOSIS — Z7289 Other problems related to lifestyle: Secondary | ICD-10-CM

## 2018-12-12 DIAGNOSIS — F3164 Bipolar disorder, current episode mixed, severe, with psychotic features: Secondary | ICD-10-CM | POA: Diagnosis not present

## 2018-12-12 DIAGNOSIS — Z880 Allergy status to penicillin: Secondary | ICD-10-CM

## 2018-12-12 DIAGNOSIS — I739 Peripheral vascular disease, unspecified: Secondary | ICD-10-CM

## 2018-12-12 DIAGNOSIS — F29 Unspecified psychosis not due to a substance or known physiological condition: Secondary | ICD-10-CM | POA: Diagnosis not present

## 2018-12-12 DIAGNOSIS — F1110 Opioid abuse, uncomplicated: Secondary | ICD-10-CM

## 2018-12-12 DIAGNOSIS — Z20828 Contact with and (suspected) exposure to other viral communicable diseases: Secondary | ICD-10-CM | POA: Diagnosis present

## 2018-12-12 DIAGNOSIS — E785 Hyperlipidemia, unspecified: Secondary | ICD-10-CM

## 2018-12-12 LAB — SARS CORONAVIRUS 2 BY RT PCR (HOSPITAL ORDER, PERFORMED IN ~~LOC~~ HOSPITAL LAB): SARS Coronavirus 2: NEGATIVE

## 2018-12-12 LAB — POC SARS CORONAVIRUS 2 AG -  ED: SARS Coronavirus 2 Ag: NEGATIVE

## 2018-12-12 MED ORDER — ACETAMINOPHEN 325 MG PO TABS
650.0000 mg | ORAL_TABLET | Freq: Four times a day (QID) | ORAL | Status: DC | PRN
  Administered 2018-12-12: 650 mg via ORAL
  Filled 2018-12-12: qty 2

## 2018-12-12 MED ORDER — PANTOPRAZOLE SODIUM 40 MG PO TBEC
40.0000 mg | DELAYED_RELEASE_TABLET | Freq: Every day | ORAL | Status: DC
  Administered 2018-12-13 – 2018-12-17 (×5): 40 mg via ORAL
  Filled 2018-12-12 (×5): qty 1

## 2018-12-12 MED ORDER — ALBUTEROL SULFATE HFA 108 (90 BASE) MCG/ACT IN AERS
2.0000 | INHALATION_SPRAY | RESPIRATORY_TRACT | Status: DC | PRN
  Filled 2018-12-12: qty 6.7

## 2018-12-12 MED ORDER — RIVAROXABAN 20 MG PO TABS
20.0000 mg | ORAL_TABLET | Freq: Every day | ORAL | Status: DC
  Administered 2018-12-13 – 2018-12-17 (×5): 20 mg via ORAL
  Filled 2018-12-12 (×6): qty 1

## 2018-12-12 MED ORDER — ROSUVASTATIN CALCIUM 20 MG PO TABS
40.0000 mg | ORAL_TABLET | Freq: Every day | ORAL | Status: DC
  Administered 2018-12-13 – 2018-12-17 (×5): 40 mg via ORAL
  Filled 2018-12-12 (×6): qty 2

## 2018-12-12 MED ORDER — MAGNESIUM HYDROXIDE 400 MG/5ML PO SUSP: 30.0000 mL | Freq: Every day | ORAL | Status: DC | PRN

## 2018-12-12 MED ORDER — VENLAFAXINE HCL ER 37.5 MG PO CP24
37.5000 mg | ORAL_CAPSULE | Freq: Every day | ORAL | Status: DC
  Administered 2018-12-13: 37.5 mg via ORAL
  Filled 2018-12-12 (×2): qty 1

## 2018-12-12 MED ORDER — CARVEDILOL 6.25 MG PO TABS
6.2500 mg | ORAL_TABLET | Freq: Every day | ORAL | Status: DC
  Administered 2018-12-13 – 2018-12-17 (×5): 6.25 mg via ORAL
  Filled 2018-12-12 (×5): qty 1

## 2018-12-12 MED ORDER — ACETAMINOPHEN 325 MG PO TABS
650.0000 mg | ORAL_TABLET | Freq: Four times a day (QID) | ORAL | Status: DC | PRN
  Administered 2018-12-14 – 2018-12-17 (×5): 650 mg via ORAL
  Filled 2018-12-12 (×5): qty 2

## 2018-12-12 MED ORDER — ALUM & MAG HYDROXIDE-SIMETH 200-200-20 MG/5ML PO SUSP: 30.0000 mL | ORAL | Status: DC | PRN

## 2018-12-12 NOTE — BHH Counselor (Signed)
  REASSESSMENT  [place] reevaluated pt for safety and stability.  Pt was observed laying on the bed, alert, and oriented x2.  Pt continues endorses SI/HI.  Pt states, "I feel a lot better than I did yesterday.  I been going through this for the past 20 years, I'm coming off of some drugs.  I just don't which ones.  I need treatment and someone to talk to.  I think I'm homeless, I'm not sure.  I have 2 children, 2 daughters and a son."  Pt appeared confused in her conversation and has disorganized speech.  Pt continues to meet inpatient criteria.  Arnell Slivinski L. [place], [place], [facility], [facility] Therapeutic Triage Specialist  [phone]

## 2018-12-12 NOTE — BH Assessment (Signed)
Patient has been accepted to [facility] on 12/11/2018, pending COVID results  Accepted by Psychiatric Nurse Practitioner, Waylan Boga, on the behalf of Dr. Mallie Darting.  Attending Physician will be Dr. Mallie Darting.  Patient has been assigned to room 322, by [place].  Call report to [phone].  Representative/Transfer Coordinator is Dispensing optician Patient pre-admitted by [facility] Patient Access Gust Rung.,)  [facility] Staff Holli Humbles., Disposition Social Worker) made aware of acceptance.

## 2018-12-12 NOTE — ED Provider Notes (Signed)
Patient continues to be calm, and is awaiting transfer to a psychiatric facility for further treatment.  She was seen by TTS this morning by telemetry.   Daleen Bo, MD 12/12/18 [phone]

## 2018-12-12 NOTE — Tx Team (Signed)
Initial Treatment Plan 12/12/2018 6:49 PM [name] [id number]    PATIENT STRESSORS: Financial difficulties Marital or family conflict Substance abuse   PATIENT STRENGTHS: Ability for insight Average or above average intelligence Communication skills Supportive family/friends   PATIENT IDENTIFIED PROBLEMS: Substance  Abuse  12/12/2018  Anger Management  12/12/2018  Depression  12/12/2018                 DISCHARGE CRITERIA:  Ability to meet basic life and health needs Adequate post-discharge living arrangements Improved stabilization in mood, thinking, and/or behavior  PRELIMINARY DISCHARGE PLAN: Outpatient therapy Return to previous living arrangement  PATIENT/FAMILY INVOLVEMENT: This treatment plan has been presented to and reviewed with the patient, Lindsay Bradford, and/or family member,  .  The patient and family have been given the opportunity to ask questions and make suggestions.  Leodis Liverpool, RN 12/12/2018, 6:49 PM

## 2018-12-12 NOTE — Plan of Care (Signed)
Patient newly admitted, hasn't had time to progress  Problem: Education: Goal: Knowledge of [place] [facility] Education information/materials will improve Outcome: Not Progressing   Problem: Coping: Goal: Ability to verbalize frustrations and anger appropriately will improve Outcome: Not Progressing Goal: Ability to demonstrate self-control will improve Outcome: Not Progressing   Problem: Safety: Goal: Periods of time without injury will increase Outcome: Not Progressing   Problem: Education: Goal: Knowledge of disease or condition will improve Outcome: Not Progressing   Problem: Education: Goal: Ability to verbalize precipitating factors for violent behavior will improve Outcome: Not Progressing

## 2018-12-12 NOTE — Plan of Care (Signed)
New Admission  , patient has not started  working on plan of care  Problem: Education: Goal: Knowledge of [place] Education information/materials will improve Outcome: Not Progressing   Problem: Coping: Goal: Ability to verbalize frustrations and anger appropriately will improve Outcome: Not Progressing Goal: Ability to demonstrate self-control will improve Outcome: Not Progressing   Problem: Education: Goal: Knowledge of disease or condition will improve Outcome: Not Progressing   Problem: Education: Goal: Ability to verbalize precipitating factors for violent behavior will improve Outcome: Not Progressing
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
## 2018-12-12 NOTE — Progress Notes (Signed)
D - Patient in the day room upon arrival to the unit. Patient pleasant during assessment denying SI/HI/AVH. Patient endorses pain rated 5/10 (see MAR) Patient endorse anxiety and depression. Patient said she regrets her actions that caused her to come here and its adding to her anxiety. Patient given support and encouragement.   A - Patient compliant with medication administration per MD orders and procedures on the unit. Patient given education. Patient informed to let staff know if there are any issues or problems on the unit.   R - Patient being monitored Q 15 minutes for safety per unit protocol. Patient remains safe on the unit.

## 2018-12-13 DIAGNOSIS — F3164 Bipolar disorder, current episode mixed, severe, with psychotic features: Secondary | ICD-10-CM

## 2018-12-13 MED ORDER — CLONIDINE HCL 0.1 MG PO TABS
0.1000 mg | ORAL_TABLET | Freq: Four times a day (QID) | ORAL | Status: AC
  Administered 2018-12-13 – 2018-12-14 (×3): 0.1 mg via ORAL
  Filled 2018-12-13 (×5): qty 1

## 2018-12-13 MED ORDER — DICYCLOMINE HCL 20 MG PO TABS: 20.0000 mg | ORAL_TABLET | Freq: Four times a day (QID) | ORAL | Status: DC | PRN

## 2018-12-13 MED ORDER — GABAPENTIN 300 MG PO CAPS
300.0000 mg | ORAL_CAPSULE | Freq: Three times a day (TID) | ORAL | Status: DC
  Administered 2018-12-13 – 2018-12-17 (×12): 300 mg via ORAL
  Filled 2018-12-13 (×12): qty 1

## 2018-12-13 MED ORDER — ASPIRIN EC 81 MG PO TBEC
81.0000 mg | DELAYED_RELEASE_TABLET | Freq: Every day | ORAL | Status: DC
  Administered 2018-12-13 – 2018-12-17 (×5): 81 mg via ORAL
  Filled 2018-12-13 (×5): qty 1

## 2018-12-13 MED ORDER — HYDROXYZINE HCL 25 MG PO TABS: 25.0000 mg | ORAL_TABLET | Freq: Four times a day (QID) | ORAL | Status: DC | PRN

## 2018-12-13 MED ORDER — CLONIDINE HCL 0.1 MG PO TABS
0.1000 mg | ORAL_TABLET | ORAL | Status: DC
  Administered 2018-12-16 – 2018-12-17 (×2): 0.1 mg via ORAL
  Filled 2018-12-13 (×3): qty 1

## 2018-12-13 MED ORDER — TIOTROPIUM BROMIDE MONOHYDRATE 18 MCG IN CAPS
18.0000 ug | ORAL_CAPSULE | Freq: Every day | RESPIRATORY_TRACT | Status: DC
  Administered 2018-12-13 – 2018-12-17 (×5): 18 ug via RESPIRATORY_TRACT
  Filled 2018-12-13: qty 5

## 2018-12-13 MED ORDER — OMEGA-3-ACID ETHYL ESTERS 1 G PO CAPS
1.0000 g | ORAL_CAPSULE | Freq: Two times a day (BID) | ORAL | Status: DC
  Administered 2018-12-13 – 2018-12-17 (×9): 1 g via ORAL
  Filled 2018-12-13 (×10): qty 1

## 2018-12-13 MED ORDER — ONDANSETRON 4 MG PO TBDP: 4.0000 mg | ORAL_TABLET | Freq: Four times a day (QID) | ORAL | Status: DC | PRN

## 2018-12-13 MED ORDER — HALOPERIDOL 1 MG PO TABS
2.0000 mg | ORAL_TABLET | Freq: Three times a day (TID) | ORAL | Status: DC
  Administered 2018-12-13 – 2018-12-16 (×8): 2 mg via ORAL
  Filled 2018-12-13 (×8): qty 2

## 2018-12-13 MED ORDER — LORAZEPAM 2 MG/ML IJ SOLN: 2.0000 mg | Freq: Four times a day (QID) | INTRAMUSCULAR | Status: DC | PRN

## 2018-12-13 MED ORDER — NITROGLYCERIN 0.4 MG SL SUBL: 0.4000 mg | SUBLINGUAL_TABLET | SUBLINGUAL | Status: DC | PRN

## 2018-12-13 MED ORDER — LORAZEPAM 1 MG PO TABS: 1.0000 mg | ORAL_TABLET | Freq: Four times a day (QID) | ORAL | Status: DC | PRN

## 2018-12-13 MED ORDER — CLONIDINE HCL 0.1 MG PO TABS: 0.1000 mg | ORAL_TABLET | Freq: Every day | ORAL | Status: DC

## 2018-12-13 MED ORDER — CHOLECALCIFEROL 10 MCG (400 UNIT) PO TABS
400.0000 [IU] | ORAL_TABLET | Freq: Every day | ORAL | Status: DC
  Administered 2018-12-13 – 2018-12-17 (×5): 400 [IU] via ORAL
  Filled 2018-12-13 (×5): qty 1

## 2018-12-13 MED ORDER — ALBUTEROL SULFATE HFA 108 (90 BASE) MCG/ACT IN AERS
1.0000 | INHALATION_SPRAY | Freq: Four times a day (QID) | RESPIRATORY_TRACT | Status: DC | PRN
  Administered 2018-12-14: 2 via RESPIRATORY_TRACT
  Filled 2018-12-13: qty 6.7

## 2018-12-13 MED ORDER — LORAZEPAM 1 MG PO TABS
1.0000 mg | ORAL_TABLET | Freq: Four times a day (QID) | ORAL | Status: DC | PRN
  Administered 2018-12-13 – 2018-12-14 (×2): 1 mg via ORAL
  Filled 2018-12-13 (×2): qty 1

## 2018-12-13 MED ORDER — VALACYCLOVIR HCL 500 MG PO TABS
500.0000 mg | ORAL_TABLET | Freq: Every day | ORAL | Status: DC
  Administered 2018-12-13 – 2018-12-17 (×5): 500 mg via ORAL
  Filled 2018-12-13 (×5): qty 1

## 2018-12-13 MED ORDER — METHOCARBAMOL 500 MG PO TABS
500.0000 mg | ORAL_TABLET | Freq: Three times a day (TID) | ORAL | Status: DC | PRN
  Administered 2018-12-13 – 2018-12-17 (×8): 500 mg via ORAL
  Filled 2018-12-13 (×8): qty 1

## 2018-12-13 NOTE — Progress Notes (Signed)
Recreation Therapy Notes  Date: 12/13/2018  Time: 9:30 am   Location: Craft room   Behavioral response: N/A   Intervention Topic: Necessities   Discussion/Intervention: Patient did not attend group.   Clinical Observations/Feedback:  Patient did not attend group.   Shavaughn Seidl LRT/CTRS        Jett Fukuda 12/13/2018 11:06 AM 

## 2018-12-13 NOTE — Tx Team (Signed)
Interdisciplinary Treatment and Diagnostic Plan Update  12/13/2018 Time of Session: 930am [name] MRN: [id number]  Principal Diagnosis: <principal problem not specified>  Secondary Diagnoses: Active Problems:   Major depressive disorder, recurrent severe without psychotic features ([name])   Current Medications:  Current Facility-Administered Medications  Medication Dose Route Frequency Provider Last Rate Last Dose  . acetaminophen (TYLENOL) tablet 650 mg  650 mg Oral Q6H PRN [name], NP      . albuterol (VENTOLIN HFA) 108 (90 Base) MCG/ACT inhaler 1-2 puff  1-2 puff Inhalation Q6H PRN [name], MD      . alum & mag hydroxide-simeth (MAALOX/MYLANTA) 200-200-20 MG/5ML suspension 30 mL  30 mL Oral Q4H PRN [name], NP      . aspirin EC tablet 81 mg  81 mg Oral Daily [name], MD      . carvedilol (COREG) tablet 6.25 mg  6.25 mg Oral Daily [name], NP   6.25 mg at 12/13/18 0900  . cholecalciferol (VITAMIN D3) tablet 400 Units  400 Units Oral Daily [name], MD      . cloNIDine (CATAPRES) tablet 0.1 mg  0.1 mg Oral QID [name], MD       Followed by  . [START ON 12/15/2018] cloNIDine (CATAPRES) tablet 0.1 mg  0.1 mg Oral BH-qamhs [name], [name], MD       Followed by  . [START ON 12/18/2018] cloNIDine (CATAPRES) tablet 0.1 mg  0.1 mg Oral QAC breakfast [name], MD      . dicyclomine (BENTYL) tablet 20 mg  20 mg Oral Q6H PRN [name], MD      . gabapentin (NEURONTIN) capsule 300 mg  300 mg Oral TID [name], MD      . hydrOXYzine (ATARAX/VISTARIL) tablet 25 mg  25 mg Oral Q6H PRN [name], MD      . LORazepam (ATIVAN) tablet 1 mg  1 mg Oral Q6H PRN [name], MD       Or  . LORazepam (ATIVAN) injection 2 mg  2 mg Intramuscular Q6H PRN [name], MD      . magnesium hydroxide (MILK OF MAGNESIA) suspension 30 mL  30 mL Oral Daily PRN [name], NP      .  methocarbamol (ROBAXIN) tablet 500 mg  500 mg Oral Q8H PRN [name], MD      . nitroGLYCERIN (NITROSTAT) SL tablet 0.4 mg  0.4 mg Sublingual Q5 min PRN [name], MD      . omega-3 acid ethyl esters (LOVAZA) capsule 1 g  1 g Oral BID [name], MD      . ondansetron (ZOFRAN-ODT) disintegrating tablet 4 mg  4 mg Oral Q6H PRN [name], MD      . pantoprazole (PROTONIX) EC tablet 40 mg  40 mg Oral Daily [name], NP   40 mg at 12/13/18 0900  . rivaroxaban (XARELTO) tablet 20 mg  20 mg Oral Daily [name], NP   20 mg at 12/13/18 0900  . rosuvastatin (CRESTOR) tablet 40 mg  40 mg Oral Daily [name], NP   40 mg at 12/13/18 0900  . tiotropium (SPIRIVA) inhalation capsule ([facility] use ONLY) 18 mcg  18 mcg Inhalation Daily [name], MD      . valACYclovir (VALTREX) tablet 500 mg  500 mg Oral Daily [name], MD  PTA Medications: Medications Prior to Admission  Medication Sig Dispense Refill Last Dose  . albuterol (VENTOLIN HFA) 108 (90 Base) MCG/ACT inhaler TAKE 2 PUFFS BY MOUTH EVERY 6 HOURS AS NEEDED FOR WHEEZE OR SHORTNESS OF BREATH (Patient taking differently: Inhale 1 puff into the lungs every 6 (six) hours as needed for wheezing or shortness of breath. ) 18 g 0   . baclofen (LIORESAL) 20 MG tablet Take 20 mg by mouth daily.     . carvedilol (COREG) 12.5 MG tablet Take 12.5 mg by mouth 2 (two) times daily with a meal.     . DULoxetine (CYMBALTA) 60 MG capsule Take 60 mg by mouth daily.     [name] HYDROcodone-acetaminophen (NORCO/VICODIN) 5-325 MG tablet Take 1 tablet by mouth every 4 (four) hours as needed. (Patient taking differently: Take 1 tablet by mouth every 4 (four) hours as needed for moderate pain. ) 10 tablet 0   . linezolid (ZYVOX) 600 MG tablet Take 600 mg by mouth 2 (two) times daily. 10 day course starting on 12/02/2018     . nabumetone (RELAFEN) 500 MG tablet Take 1,000 mg by mouth 2 (two) times daily.     .  nitroGLYCERIN (NITROSTAT) 0.4 MG SL tablet Place 0.4 mg under the tongue every 5 (five) minutes as needed for chest pain.     . pantoprazole (PROTONIX) 40 MG tablet Take 1 tablet (40 mg total) by mouth daily. 90 tablet 1   . rosuvastatin (CRESTOR) 40 MG tablet Take 1 tablet (40 mg total) by mouth daily. 90 tablet 3   . tiotropium (SPIRIVA) 18 MCG inhalation capsule Place 18 mcg into inhaler and inhale daily.     . traZODone (DESYREL) 150 MG tablet Use from 1/3 to 1 tablet nightly as needed for sleep. (Patient taking differently: Take 50-150 mg by mouth at bedtime as needed for sleep. ) 30 tablet 5   . valACYclovir (VALTREX) 1000 MG tablet Take 2,000 mg by mouth daily.     [name] venlafaxine XR (EFFEXOR XR) 37.5 MG 24 hr capsule Take 1 capsule (37.5 mg total) by mouth daily with breakfast for 14 days, THEN 2 capsules (75 mg total) daily with breakfast. 74 capsule 0   . XARELTO 20 MG TABS tablet Take 1 tablet (20 mg total) by mouth daily. 90 tablet 1     Patient Stressors: Financial difficulties Marital or family conflict Substance abuse  Patient Strengths: Ability for insight Average or above average intelligence Communication skills Supportive family/friends  Treatment Modalities: Medication Management, Group therapy, Case management,  1 to 1 session with clinician, Psychoeducation, Recreational therapy.   Physician Treatment Plan for Primary Diagnosis: <principal problem not specified> Long Term Goal(s):     Short Term Goals:    Medication Management: Evaluate patient's response, side effects, and tolerance of medication regimen.  Therapeutic Interventions: 1 to 1 sessions, Unit Group sessions and Medication administration.  Evaluation of Outcomes: Not Met  Physician Treatment Plan for Secondary Diagnosis: Active Problems:   Major depressive disorder, recurrent severe without psychotic features ([place])  Long Term Goal(s):     Short Term Goals:       Medication Management: Evaluate  patient's response, side effects, and tolerance of medication regimen.  Therapeutic Interventions: 1 to 1 sessions, Unit Group sessions and Medication administration.  Evaluation of Outcomes: Not Met   RN Treatment Plan for Primary Diagnosis: <principal problem not specified> Long Term Goal(s): Knowledge of disease and therapeutic regimen to maintain health will improve  Short  Term Goals: Ability to participate in decision making will improve, Ability to verbalize feelings will improve, Ability to disclose and discuss suicidal ideas, Ability to identify and develop effective coping behaviors will improve and Compliance with prescribed medications will improve  Medication Management: RN will administer medications as ordered by provider, will assess and evaluate patient's response and provide education to patient for prescribed medication. RN will report any adverse and/or side effects to prescribing provider.  Therapeutic Interventions: 1 on 1 counseling sessions, Psychoeducation, Medication administration, Evaluate responses to treatment, Monitor vital signs and CBGs as ordered, Perform/monitor CIWA, COWS, AIMS and Fall Risk screenings as ordered, Perform wound care treatments as ordered.  Evaluation of Outcomes: Not Met   LCSW Treatment Plan for Primary Diagnosis: <principal problem not specified> Long Term Goal(s): Safe transition to appropriate next level of care at discharge, Engage patient in therapeutic group addressing interpersonal concerns.  Short Term Goals: Engage patient in aftercare planning with referrals and resources  Therapeutic Interventions: Assess for all discharge needs, 1 to 1 time with Social worker, Explore available resources and support systems, Assess for adequacy in community support network, Educate family and significant other(s) on suicide prevention, Complete Psychosocial Assessment, Interpersonal group therapy.  Evaluation of Outcomes: Not Met   Progress  in Treatment: Attending groups: No. Participating in groups: No. Taking medication as prescribed: Yes. Toleration medication: Yes. Family/Significant other contact made: No, will contact:  pt's daughter Patient understands diagnosis: No. Discussing patient identified problems/goals with staff: Yes. Medical problems stabilized or resolved: No. Denies suicidal/homicidal ideation: No. Issues/concerns per patient self-inventory: No. Other: NA  New problem(s) identified: No, Describe:  none reported  New Short Term/Long Term Goal(s):Attend outpatient treatment, take medication as prescribed, develop and implement healthy coping methods to manage stressors  Patient Goals:  "Get the fuck out of here"  Discharge Plan or Barriers: Pt will return home and follow up with outpatient treatment. Pt reports not having a mental health provider and request referral for outpatient treatment  Reason for Continuation of Hospitalization: Medication stabilization  Estimated Length of Stay:1-7 days  Attendees: Patient:Lindsay Bradford 12/13/2018 10:50 AM  Physician: Myles Lipps 12/13/2018 10:50 AM  Nursing: Rosemary Holms 12/13/2018 10:50 AM  RN Care Manager: 12/13/2018 10:50 AM  Social Worker: Anise Salvo 12/13/2018 10:50 AM  Recreational Therapist: Isaias Sakai Outlaw 12/13/2018 10:50 AM  Other:  12/13/2018 10:50 AM  Other:  12/13/2018 10:50 AM  Other: 12/13/2018 10:50 AM    Scribe for Treatment Team: Yvette Rack, LCSW 12/13/2018 10:50 AM
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
## 2018-12-13 NOTE — Progress Notes (Signed)
Recreation Therapy Notes  INPATIENT RECREATION THERAPY ASSESSMENT  Patient Details Name: Trinisha Paget MRN: [id number] DOB: [birth year] Today's Date: 12/13/2018       Information Obtained From: Patient  Able to Participate in Assessment/Interview: Yes  Patient Presentation: Responsive  Reason for Admission (Per Patient): Active Symptoms, Substance Abuse  Patient Stressors:    Coping Skills:   Substance Abuse  Leisure Interests (2+):  Crafts - Knitting/Crocheting  Frequency of Recreation/Participation:    Awareness of Community Resources:     Community Resources:     Current Use:    If no, Barriers?:    Expressed Interest in [employer] Information:    [place] of Residence:  Acupuncturist  Patient Main Form of Transportation: Musician  Patient Strengths:  Giving  Patient Identified Areas of Improvement:  N/A  Patient Goal for Hospitalization:  Getting out  Current SI (including self-harm):  No  Current HI:  No  Current AVH: No  Staff Intervention Plan: Group Attendance, Collaborate with Interdisciplinary Treatment Team  Consent to Intern Participation: N/A  Eleftherios Dudenhoeffer 12/13/2018, 2:51 PM
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
## 2018-12-13 NOTE — BHH Group Notes (Signed)
LCSW Group Therapy Note  12/13/2018 1:00 PM  Type of Therapy/Topic:  Group Therapy:  Balance in Life  Participation Level:  Active  Description of Group:    This group will address the concept of balance and how it feels and looks when one is unbalanced. Patients will be encouraged to process areas in their lives that are out of balance and identify reasons for remaining unbalanced. Facilitators will guide patients in utilizing problem-solving interventions to address and correct the stressor making their life unbalanced. Understanding and applying boundaries will be explored and addressed for obtaining and maintaining a balanced life. Patients will be encouraged to explore ways to assertively make their unbalanced needs known to significant others in their lives, using other group members and facilitator for support and feedback.  Therapeutic Goals: 1. Patient will identify two or more emotions or situations they have that consume much of in their lives. 2. Patient will identify signs/triggers that life has become out of balance:  3. Patient will identify two ways to set boundaries in order to achieve balance in their lives:  4. Patient will demonstrate ability to communicate their needs through discussion and/or role plays  Summary of Patient Progress: Patient was present in group.  Patient shared that she feels balanced when she is "not angry".  She reports that she feels unbalanced "in using street drugs".  She reports that she tries to be consistent to remain balance in her life.  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    Therapeutic Modalities:   Cognitive Behavioral Therapy Solution-Focused Therapy Assertiveness Training  Assunta Curtis MSW, LCSW 12/13/2018 12:31 PM

## 2018-12-14 DIAGNOSIS — F332 Major depressive disorder, recurrent severe without psychotic features: Principal | ICD-10-CM

## 2018-12-14 NOTE — Progress Notes (Signed)
Pt. Reports improved breathing and reduce SOB.

## 2018-12-14 NOTE — Progress Notes (Signed)
Patient at and around this time reporting some SOB with exertion and some wheezing. PRN inhaler given per order.

## 2018-12-14 NOTE — Plan of Care (Signed)
D: Pt during assessments denies SI/HI/AVH. Pt is pleasant and cooperative, endorses some anxiousness, but does not take PRN medicine for this. Pt. Reports doing better today overall. Pt. Educated on  high falls risk safety. Pt. Reports pain that is being addressed utilizing MD orders.   A: Q x 15 minute observation checks were completed for safety. Patient was provided with education.  Patient was given/offered medications per orders. Patient  was encourage to attend groups, participate in unit activities and continue with plan of care. Pt. Chart and plans of care reviewed. Pt. Given support and encouragement.   R: Patient is complaint with medication and unit procedures. Pt. Utilizing walker for safety on other high falls equipment. Pt observed eating good. Pt. Monitored per cows. Pt. Wound assessed and does not appear to need dressing changes or other interventions.             Problem: Education: Goal: Knowledge of [facility] Education information/materials will improve Outcome: Progressing   Problem: Coping: Goal: Ability to verbalize frustrations and anger appropriately will improve Outcome: Progressing Goal: Ability to demonstrate self-control will improve Outcome: Progressing   Problem: Safety: Goal: Periods of time without injury will increase Outcome: Progressing   Problem: Education: Goal: Knowledge of disease or condition will improve Outcome: Progressing   Problem: Education: Goal: Ability to verbalize precipitating factors for violent behavior will improve Outcome: Progressing

## 2018-12-14 NOTE — BHH Group Notes (Signed)
CSW did not have group today on Overcoming Obstacles due to patient's have recreational time and unit acuity.    Devyne Hauger, MSW, LCSW Clinical Social Worker II  [facility] Phone: [phone] Fax: [phone]  

## 2018-12-14 NOTE — Progress Notes (Signed)
Pt. Fluids and encouraged to hydrate to elevate blood pressure.

## 2018-12-14 NOTE — Progress Notes (Signed)
Call placed in the early morning for supplies to perform MD ordered cultures. No supply available yet or given. Awaiting supply to for-fill orders. Will continue to reach out for supply.
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
## 2018-12-15 LAB — CBC
HCT: 45 % (ref 36.0–46.0)
Hemoglobin: 14.7 g/dL (ref 12.0–15.0)
MCH: 31.4 pg (ref 26.0–34.0)
MCHC: 32.7 g/dL (ref 30.0–36.0)
MCV: 96.2 fL (ref 80.0–100.0)
Platelets: 253 10*3/uL (ref 150–400)
RBC: 4.68 MIL/uL (ref 3.87–5.11)
RDW: 14 % (ref 11.5–15.5)
WBC: 8.1 10*3/uL (ref 4.0–10.5)
nRBC: 0 % (ref 0.0–0.2)

## 2018-12-15 NOTE — Plan of Care (Signed)
  Problem: Education: Goal: Knowledge of disease or condition will improve Outcome: Adequate for Discharge  Patient shows no sign of withdrawal symptoms she appears less anxious.
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
## 2018-12-15 NOTE — Progress Notes (Signed)
Patient alert no distress noted, affect is flat but she brightens upon approach, her thoughts are organized and coherent, interacting appropriately with peers and staff, compliant with medication regimen,, se denies SI/HI/AVH. Patient has a boot on her right leg she was able to move her toes, feet warm to touch , pedal pulse palpable, she moves around with a four wheel walker. 15 minutes safety checks maintained will continue to monitor.

## 2018-12-15 NOTE — Progress Notes (Signed)
Patient is asleep at the time of morning medications. This writer will administer medications once she awakes. MD notified.
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
## 2018-12-15 NOTE — Plan of Care (Signed)
D- Patient alert and oriented. Patient presented in a pleasant mood on assessment stating that she slept "wonderful" last night and had no complaints to voice to this Probation officer. Patient denied any signs/symptoms of depression/anxiety, although she did report a "3/10" and "2/10", on her self-inventory. Patient stated that overall, she is feeling "ok", and reports that she is ready to go home today. Patient also denied SI, HI, AVH, and pain at this time. Patient's goal for today is "pain in my ankle", in which she will "stay off it as much as possible".  A- Scheduled medications administered to patient, per MD orders. Support and encouragement provided.  Routine safety checks conducted every 15 minutes.  Patient informed to notify staff with problems or concerns.  R- No adverse drug reactions noted. Patient contracts for safety at this time. Patient compliant with medications and treatment plan. Patient receptive, calm, and cooperative. Patient interacts well with others on the unit.  Patient remains safe at this time.  Problem: Education: Goal: Knowledge of [facility] Education information/materials will improve Outcome: Progressing   Problem: Coping: Goal: Ability to verbalize frustrations and anger appropriately will improve Outcome: Progressing Goal: Ability to demonstrate self-control will improve Outcome: Progressing   Problem: Safety: Goal: Periods of time without injury will increase Outcome: Progressing   Problem: Education: Goal: Knowledge of disease or condition will improve Outcome: Progressing   Problem: Education: Goal: Ability to verbalize precipitating factors for violent behavior will improve Outcome: Progressing

## 2018-12-15 NOTE — Plan of Care (Signed)
  Problem: Coping: Goal: Ability to demonstrate self-control will improve Outcome: Progressing  No outburst she is appropriate on the unit and receptive to staff.

## 2018-12-16 DIAGNOSIS — F332 Major depressive disorder, recurrent severe without psychotic features: Secondary | ICD-10-CM | POA: Diagnosis not present

## 2018-12-16 MED ORDER — HALOPERIDOL 1 MG PO TABS
2.0000 mg | ORAL_TABLET | Freq: Two times a day (BID) | ORAL | Status: DC
  Administered 2018-12-17: 2 mg via ORAL
  Filled 2018-12-16: qty 2

## 2018-12-16 NOTE — Progress Notes (Addendum)
The patient has been in her room the entire shift. She complains of right leg pain. Her pain was 6/10 she received tylenol 650mg  and 500mg  of Robaxin. Her pain decreased 2/10. She called the nurses station at 4:35am complaining of leg pain 6/10, she is not due to receive her pain meds again until 7:30. We did apply ice packs to see if that would aleviate the pain, we also used two pillows to elevate the leg. This also seem to help decrease the pain.  Denies SI/HI AVH. Will continue to monitor and support.
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
## 2018-12-16 NOTE — Plan of Care (Signed)
D- Patient alert and oriented. Patient presented in a pleasant, but slightly irritable mood because of her continued leg pain. Patient stated that she didn't sleep well because she was not given her pain medication last night. Patient rated her pain a "7/10", in which she did request pain medication from this Probation officer. Patient denies depression/anxiety to this writer, however, she rated them both a "3/10" on her self-inventory, but did not endorse any of this to this Probation officer. Patient denies SI, HI, AVH at this time. Patient's goal for today is going home", in which she will "cooperate", in order to accomplish her goal.  A- Scheduled medications administered to patient, per MD orders. Support and encouragement provided.  Routine safety checks conducted every 15 minutes.  Patient informed to notify staff with problems or concerns.  R- No adverse drug reactions noted. Patient contracts for safety at this time. Patient compliant with medications and treatment plan. Patient receptive, calm, and cooperative. Patient interacts well with others on the unit.  Patient remains safe at this time.  Problem: Education: Goal: Knowledge of [facility] Education information/materials will improve Outcome: Progressing   Problem: Coping: Goal: Ability to verbalize frustrations and anger appropriately will improve Outcome: Progressing Goal: Ability to demonstrate self-control will improve Outcome: Progressing   Problem: Safety: Goal: Periods of time without injury will increase Outcome: Progressing   Problem: Education: Goal: Knowledge of disease or condition will improve Outcome: Progressing   Problem: Education: Goal: Ability to verbalize precipitating factors for violent behavior will improve Outcome: Progressing

## 2018-12-16 NOTE — Progress Notes (Signed)
Recreation Therapy Notes  Date: 12/16/2018  Time: 9:30 am  Location: Craft room  Behavioral response: Appropriate   Intervention Topic: Stress  Discussion/Intervention:   Group content on today was focused on stress. The group defined stress and way to cope with stress. Participants expressed how they know when they are stresses out. Individuals described the different ways they have to cope with stress. The group stated reasons why it is important to cope with stress. Patient explained what good stress is and some examples. The group participated in the intervention "Stress Management". Individuals were separated into two group and answered questions related to stress.  Clinical Observations/Feedback:  Patient came to group and defined stress as being overwhelmed. She stated that she deals with her stress by writing down the pros and cons of a situation. Participant expressed that stress comes from being overworked, children and health. Individual participated in the intervention and was social with peers and staff during group. Gerene Nedd LRT/CTRS         Ezma Rehm 12/16/2018 11:03 AM

## 2018-12-16 NOTE — Progress Notes (Signed)
Patient just stated to this writer that she does not want to speak to her daughter anymore tonight.

## 2018-12-16 NOTE — [facility] Group Notes (Signed)
LCSW Group Therapy Note   12/16/2018 11:29 AM   Type of Therapy and Topic:  Group Therapy:  Overcoming Obstacles   Participation Level:  Active   Description of Group:    In this group patients will be encouraged to explore what they see as obstacles to their own wellness and recovery. They will be guided to discuss their thoughts, feelings, and behaviors related to these obstacles. The group will process together ways to cope with barriers, with attention given to specific choices patients can make. Each patient will be challenged to identify changes they are motivated to make in order to overcome their obstacles. This group will be process-oriented, with patients participating in exploration of their own experiences as well as giving and receiving support and challenge from other group members.   Therapeutic Goals: 1. Patient will identify personal and current obstacles as they relate to admission. 2. Patient will identify barriers that currently interfere with their wellness or overcoming obstacles.  3. Patient will identify feelings, thought process and behaviors related to these barriers. 4. Patient will identify two changes they are willing to make to overcome these obstacles:      Summary of Patient Progress Pt was appropriate and respectful in group. Pt was able to identify a current obstacle for her as drugs, alcohol, and having a boot on her foot. Pt reported that people and getting in her own head are things that cause her to keep using drugs instead of obtaining sobriety. Pt was able to identify coping skills to utilize and assist her with overcoming her current obstacles.     Therapeutic Modalities:   Cognitive Behavioral Therapy Solution Focused Therapy Motivational Interviewing Relapse Prevention Therapy  Evalina Field, MSW, LCSW Clinical Social Work 12/16/2018 11:29 AM

## 2018-12-16 NOTE — Telephone Encounter (Signed)
No return call from patient. Note filed. 

## 2018-12-17 DIAGNOSIS — F332 Major depressive disorder, recurrent severe without psychotic features: Secondary | ICD-10-CM | POA: Diagnosis not present

## 2018-12-17 DIAGNOSIS — F1110 Opioid abuse, uncomplicated: Secondary | ICD-10-CM

## 2018-12-17 MED ORDER — VENLAFAXINE HCL ER 75 MG PO CP24: 75.0000 mg | ORAL_CAPSULE | Freq: Every day | ORAL | 1 refills | Status: DC

## 2018-12-17 MED ORDER — VALACYCLOVIR HCL 500 MG PO TABS: 500.0000 mg | ORAL_TABLET | Freq: Every day | ORAL | 1 refills | Status: DC

## 2018-12-17 MED ORDER — NITROGLYCERIN 0.4 MG SL SUBL: 0.4000 mg | SUBLINGUAL_TABLET | SUBLINGUAL | 1 refills | Status: AC | PRN

## 2018-12-17 MED ORDER — CARVEDILOL 12.5 MG PO TABS: 12.5000 mg | ORAL_TABLET | Freq: Two times a day (BID) | ORAL | 1 refills | Status: DC

## 2018-12-17 MED ORDER — ASPIRIN 81 MG PO TBEC: 81.0000 mg | DELAYED_RELEASE_TABLET | Freq: Every day | ORAL | 1 refills | Status: AC

## 2018-12-17 MED ORDER — ALBUTEROL SULFATE HFA 108 (90 BASE) MCG/ACT IN AERS: 1.0000 | INHALATION_SPRAY | Freq: Four times a day (QID) | RESPIRATORY_TRACT | 1 refills | Status: DC | PRN

## 2018-12-17 MED ORDER — PANTOPRAZOLE SODIUM 40 MG PO TBEC: 40.0000 mg | DELAYED_RELEASE_TABLET | Freq: Every day | ORAL | 1 refills | Status: DC

## 2018-12-17 MED ORDER — TIOTROPIUM BROMIDE MONOHYDRATE 18 MCG IN CAPS: 18.0000 ug | ORAL_CAPSULE | Freq: Every day | RESPIRATORY_TRACT | 1 refills | Status: DC

## 2018-12-17 MED ORDER — GABAPENTIN 300 MG PO CAPS: 300.0000 mg | ORAL_CAPSULE | Freq: Three times a day (TID) | ORAL | 1 refills | Status: DC

## 2018-12-17 MED ORDER — OMEGA-3-ACID ETHYL ESTERS 1 G PO CAPS: 1.0000 g | ORAL_CAPSULE | Freq: Two times a day (BID) | ORAL | 1 refills | Status: DC

## 2018-12-17 MED ORDER — RIVAROXABAN 20 MG PO TABS: 20.0000 mg | ORAL_TABLET | Freq: Every day | ORAL | 1 refills | Status: AC

## 2018-12-17 MED ORDER — VENLAFAXINE HCL ER 75 MG PO CP24: 75.0000 mg | ORAL_CAPSULE | Freq: Every day | ORAL | Status: DC

## 2018-12-17 MED ORDER — ROSUVASTATIN CALCIUM 40 MG PO TABS: 40.0000 mg | ORAL_TABLET | Freq: Every day | ORAL | 1 refills | Status: DC

## 2018-12-17 MED ORDER — HALOPERIDOL 2 MG PO TABS: 2.0000 mg | ORAL_TABLET | Freq: Two times a day (BID) | ORAL | 1 refills | Status: DC

## 2018-12-17 NOTE — [facility] Group Notes (Signed)
Feelings Around Diagnosis 12/17/2018 1PM  Type of Therapy/Topic:  Group Therapy:  Feelings about Diagnosis  Participation Level:  Active   Description of Group:   This group will allow patients to explore their thoughts and feelings about diagnoses they have received. Patients will be guided to explore their level of understanding and acceptance of these diagnoses. Facilitator will encourage patients to process their thoughts and feelings about the reactions of others to their diagnosis and will guide patients in identifying ways to discuss their diagnosis with significant others in their lives. This group will be process-oriented, with patients participating in exploration of their own experiences, giving and receiving support, and processing challenge from other group members.   Therapeutic Goals: 1. Patient will demonstrate understanding of diagnosis as evidenced by identifying two or more symptoms of the disorder 2. Patient will be able to express two feelings regarding the diagnosis 3. Patient will demonstrate their ability to communicate their needs through discussion and/or role play  Summary of Patient Progress:  Actively and appropriately participated in session. Pt discussed diagnosis of insomnia and provided behavioral changes that occur when someone lacks sleep. Pt demonstrated insight and respected boundaries during session.     Therapeutic Modalities:   Cognitive Behavioral Therapy Brief Therapy Feelings Identification    Yvette Rack, LCSW 12/17/2018 2:21 PM

## 2018-12-17 NOTE — Progress Notes (Signed)
D: Patient is aware of  Discharge this shift   A:.Patient denies suicidal /homicidal ideations. Patient received all belongings brought in  Received Storage medications. Writer reviewed Discharge Summary, Suicide Risk Assessment, and Transitional Record. Patient also received Prescriptions   from  MD.  Aware  Of follow up appointment .  R: Patient left unit with no questions  Or concerns  With  daugther

## 2018-12-17 NOTE — Discharge Summary (Signed)
Physician Discharge Summary Note  Patient:  Lindsay Bradford is an 58 y.o., female MRN:  [id number] DOB:  [birth year] Patient phone:  [phone] (home)  Patient address:   [address] [place] [address],  Total Time spent with patient: 30 minutes  Date of Admission:  12/12/2018 Date of Discharge: December 17, 2018  Reason for Admission: Patient was admitted in transfer from [facility] where she had presented with confusion and agitation and possible suicidal thoughts after becoming angry with her grandson and thinking about wrecking her car.  Patient had seemed to be confused and emotionally unstable and there were concerns about safety.  Principal Problem: Major depressive disorder, recurrent severe without psychotic features [facility]) Discharge Diagnoses: Principal Problem:   Major depressive disorder, recurrent severe without psychotic features (HCC) Active Problems:   COPD with emphysema (HCC)   Factor V Leiden (HCC)   Dyslipidemia   History of CVA (cerebrovascular accident)   Ischemic cardiomyopathy   GERD (gastroesophageal reflux disease)   Opiate abuse, episodic (HCC)   Past Psychiatric History: Patient reports a past history of chronic mood problems.  She says she has been diagnosed with borderline personality disorder in the past.  She has had previous psychiatric hospitalizations but it was years ago and in a different state.  She has had 2 previous suicide attempts the most recent one however was several years ago.  Patient also describes a history of cocaine and opiate abuse in the past.  She now says that she realizes that she had been abusing her narcotic pain medicine at the time of admission on the 24th which is what led to her confusion.  She had apparently previously been on Effexor.  Only other medicine she can remember are similar antidepressants  Past Medical History:  Past Medical History:  Diagnosis Date  . Arthritis   . Asthma   . Bursitis of left hip   .  COPD (chronic obstructive pulmonary disease) (HCC)   . Coronary artery disease   . Depression   . DVT (deep venous thrombosis) (HCC)    left  . Factor V Leiden (HCC)   . Fibromyalgia   . GERD (gastroesophageal reflux disease)   . Headache    sesaonal  . Herpes simplex type 2 infection   . History of blood transfusion   . Hyperlipidemia   . Hypotension    90's, Normally; in pain normal; BP  . Ischemic cardiomyopathy   . MI (myocardial infarction) (HCC)    x 2 2000   . Pneumonia   . Stroke (HCC) 02/27/2018   no residual    Past Surgical History:  Procedure Laterality Date  . ABDOMINAL HYSTERECTOMY    . APPENDECTOMY    . CARDIAC CATHETERIZATION    . COLONOSCOPY    . FOOT SURGERY Bilateral   . LOOP RECORDER IMPLANT  06/20/2018   [facility]   . ORIF ANKLE FRACTURE Right 10/18/2018   Procedure: OPEN REDUCTION INTERNAL FIXATION (ORIF) ANKLE FRACTURE;  Surgeon: Roby Lofts, MD;  Location: [facility] OR;  Service: Orthopedics;  Laterality: Right;  . SHOULDER SURGERY Right   . VEIN BYPASS SURGERY Left    Family History:  Family History  Problem Relation Age of Onset  . Arthritis Mother   . Cancer Mother   . Colon cancer Mother   . Breast cancer Mother   . COPD Father   . COPD Brother   . Heart disease Sister   . COPD Brother   . Stroke Brother   .  Heart disease Brother   . Clotting disorder Brother   . Colon cancer Maternal Grandmother   . Cancer Maternal Grandmother   . Breast cancer Maternal Grandmother   . Cancer Paternal Grandmother   . Colon cancer Paternal Grandmother   . Cancer Nephew   . Colon cancer Nephew    Family Psychiatric  History: Denies any Social History:  Social History   Substance and Sexual Activity  Alcohol Use Not Currently     Social History   Substance and Sexual Activity  Drug Use Not Currently    Social History   Socioeconomic History  . Marital status: Single    Spouse name: Not on file  . Number of children: 2  . Years of  education: Not on file  . Highest education level: Some college, no degree  Occupational History  . Occupation: Disable   Social Needs  . Financial resource strain: Somewhat hard  . Food insecurity    Worry: Sometimes true    Inability: Sometimes true  . Transportation needs    Medical: No    Non-medical: No  Tobacco Use  . Smoking status: Heavy Tobacco Smoker    Packs/day: 0.50    Years: 35.00    Pack years: 17.50    Types: Cigarettes  . Smokeless tobacco: Never Used  Substance and Sexual Activity  . Alcohol use: Not Currently  . Drug use: Not Currently  . Sexual activity: Not Currently  Lifestyle  . Physical activity    Days per week: 0 days    Minutes per session: 0 min  . Stress: Only a little  Relationships  . Social connections    Talks on phone: More than three times a week    Gets together: More than three times a week    Attends religious service: Never    Active member of club or organization: No    Attends meetings of clubs or organizations: Never    Relationship status: Divorced  Other Topics Concern  . Not on file  Social History Narrative  . Not on file    Hospital Course: Admitted to the psychiatric unit.  Patient has been largely cooperative.  She has not made any suicide attempts or been violent or aggressive.  She has attended groups and interacted with peers and staff appropriately.  Up until yesterday she still appeared to be quite emotionally raw and upset but today tells me he is feeling much better.  She absolutely denies any suicidal thought and denies any thoughts whatsoever of harming her grandchild or anyone else.  She admits that she had been misusing narcotic pain medicine at the time of presentation on November 24 which is what put her in such a confused state.  Patient will be discharged on reduced medication with treatment for her COPD and heart disease and need for anticoagulant maintained.  Continue medicine for gastric reflux.  Continue  low-dose of haloperidol which seems to have been helpful in calming down agitation while continuing to restart Effexor at just 75 mg a day.  [facility]  She was counseled about the importance of avoiding intoxicating drugs in the future given her recent experience.  Physical Findings: AIMS:  , ,  ,  ,    CIWA:    COWS:  COWS Total Score: 1  Musculoskeletal: Strength & Muscle Tone: within normal limits Gait & Station: unsteady Patient leans: Front  Psychiatric Specialty Exam: Physical Exam  Nursing note and vitals  reviewed. Constitutional: She appears well-developed and well-nourished.  HENT:  Head: Normocephalic and atraumatic.  Eyes: Pupils are equal, round, and reactive to light. Conjunctivae are normal.  Neck: Normal range of motion.  Cardiovascular: Regular rhythm and normal heart sounds.  Respiratory: Effort normal. No respiratory distress.  GI: Soft.  Musculoskeletal: Normal range of motion.  Neurological: She is alert.  Skin: Skin is warm and dry.  Psychiatric: She has a normal mood and affect. Her speech is normal and behavior is normal. Judgment and thought content normal. Cognition and memory are normal.    Review of Systems  Constitutional: Negative.   HENT: Negative.   Eyes: Negative.   Respiratory: Negative.   Cardiovascular: Negative.   Gastrointestinal: Negative.   Musculoskeletal: Negative.   Skin: Negative.   Neurological: Negative.   Psychiatric/Behavioral: Negative.     Blood pressure 116/88, pulse 79, temperature (!) 97.5 F (36.4 C), temperature source Oral, resp. rate 18, height  (1.702 m), weight 98.4 kg, SpO2 97 %.Body mass index is 33.99 kg/m.  General Appearance: Casual  Eye Contact:  Fair  Speech:  Clear and Coherent  Volume:  Normal  Mood:  Euthymic  Affect:  Congruent  Thought Process:  Goal Directed  Orientation:  Full (Time, Place, and Person)  Thought Content:  Logical  Suicidal Thoughts:  No  Homicidal  Thoughts:  No  Memory:  Immediate;   Fair Recent;   Poor Remote;   Fair  Judgement:  Fair  Insight:  Fair  Psychomotor Activity:  Decreased  Concentration:  Concentration: Fair  Recall:  Fair  Fund of Knowledge:  Fair  Language:  Fair  Akathisia:  No  Handed:  Right  AIMS (if indicated):     Assets:  Desire for Improvement Housing Social Support  ADL's:  Intact  Cognition:  WNL  Sleep:  Number of Hours: 5     Have you used any form of tobacco in the last 30 days? (Cigarettes, Smokeless Tobacco, Cigars, and/or Pipes): Yes  Has this patient used any form of tobacco in the last 30 days? (Cigarettes, Smokeless Tobacco, Cigars, and/or Pipes) Yes, No  Blood Alcohol level:  Lab Results  Component Value Date   ETH <10 12/10/2018    Metabolic Disorder Labs:  No results found for: HGBA1C, MPG No results found for: PROLACTIN Lab Results  Component Value Date   CHOL 131 08/26/2018   TRIG 92 08/26/2018   HDL 36 (L) 08/26/2018   CHOLHDL 3.6 08/26/2018   LDLCALC 77 08/26/2018    See Psychiatric Specialty Exam and Suicide Risk Assessment completed by Attending Physician prior to discharge.  Discharge destination:  Home  Is patient on multiple antipsychotic therapies at discharge:  No   Has Patient had three or more failed trials of antipsychotic monotherapy by history:  No  Recommended Plan for Multiple Antipsychotic Therapies: NA  Discharge Instructions    Diet - low sodium heart healthy   Complete by: As directed    Increase activity slowly   Complete by: As directed      Allergies as of 12/17/2018      Reactions   Cephalexin Hives   Clarithromycin Hives   Ibuprofen Hives   Other Itching, Other (See Comments)   Whole milk and oranges   Oxymorphone Palpitations   Penicillins Anaphylaxis   Did it involve swelling of the face/tongue/throat, SOB, or low BP? Yes Did it involve sudden or severe rash/hives, skin peeling, or any reaction on the inside of your  mouth or  nose? Yes Did you need to seek medical attention at a hospital or doctor's office? Yes When did it last happen?childhood allergy If all above answers are "NO", may proceed with cephalosporin use.   Potassium Chloride Hives   Reacts to oral KCl, tolerates it slow IV   Sulfa Antibiotics Anaphylaxis   Clindamycin Hives, Itching   Fentanyl Hives, Other (See Comments)   Fentanyl patch, it breaks pt out in hives and lowers her blood pressure.   Naproxen Hives   Iodinated Diagnostic Agents Hives   Lyrica [pregabalin]    Chest pain   Tetracycline Hives, Itching      Medication List    STOP taking these medications   baclofen 20 MG tablet Commonly known as: LIORESAL   DULoxetine 60 MG capsule Commonly known as: CYMBALTA   HYDROcodone-acetaminophen 5-325 MG tablet Commonly known as: NORCO/VICODIN   linezolid 600 MG tablet Commonly known as: ZYVOX   nabumetone 500 MG tablet Commonly known as: RELAFEN   traZODone 150 MG tablet Commonly known as: DESYREL     TAKE these medications     Indication  albuterol 108 (90 Base) MCG/ACT inhaler Commonly known as: VENTOLIN HFA Inhale 1-2 puffs into the lungs every 6 (six) hours as needed for wheezing or shortness of breath. What changed: See the new instructions.  Indication: Chronic Obstructive Lung Disease   aspirin 81 MG EC tablet Take 1 tablet (81 mg total) by mouth daily. Start taking on: December 18, 2018  Indication: Stable Angina Pectoris   carvedilol 12.5 MG tablet Commonly known as: COREG Take 1 tablet (12.5 mg total) by mouth 2 (two) times daily with a meal.  Indication: Chronic Angina Following Exercise, Stress, or Excitement, High Blood Pressure Disorder   gabapentin 300 MG capsule Commonly known as: NEURONTIN Take 1 capsule (300 mg total) by mouth 3 (three) times daily.  Indication: Fibromyalgia Syndrome   haloperidol 2 MG tablet Commonly known as: HALDOL Take 1 tablet (2 mg total) by mouth 2 (two) times  daily.  Indication: Psychomotor Agitation, Psychosis   nitroGLYCERIN 0.4 MG SL tablet Commonly known as: NITROSTAT Place 1 tablet (0.4 mg total) under the tongue every 5 (five) minutes as needed for chest pain.  Indication: Acute Angina Pectoris   omega-3 acid ethyl esters 1 g capsule Commonly known as: LOVAZA Take 1 capsule (1 g total) by mouth 2 (two) times daily.  Indication: High Amount of Triglycerides in the Blood   pantoprazole 40 MG tablet Commonly known as: PROTONIX Take 1 tablet (40 mg total) by mouth daily.  Indication: Gastroesophageal Reflux Disease   rivaroxaban 20 MG Tabs tablet Commonly known as: Xarelto Take 1 tablet (20 mg total) by mouth daily. Start taking on: December 18, 2018  Indication: Blood Clot in a Deep Vein   rosuvastatin 40 MG tablet Commonly known as: Crestor Take 1 tablet (40 mg total) by mouth daily.  Indication: High Amount of Fats in the Blood   tiotropium 18 MCG inhalation capsule Commonly known as: SPIRIVA Place 1 capsule (18 mcg total) into inhaler and inhale daily. Start taking on: December 18, 2018 What changed: when to take this  Indication: Chronic Obstructive Lung Disease   valACYclovir 500 MG tablet Commonly known as: VALTREX Take 1 tablet (500 mg total) by mouth daily. Start taking on: December 18, 2018 What changed:   medication strength  how much to take  Indication: Herpes Simplex Infection   venlafaxine XR 75 MG 24 hr capsule Commonly known  as: EFFEXOR-XR Take 1 capsule (75 mg total) by mouth daily with breakfast. Start taking on: December 18, 2018 What changed:   medication strength  See the new instructions.  Indication: Major Depressive Disorder      Follow-up Information    [facility] Follow up on 12/23/2018.   Why: You have an appoitnment scheduled for 12/23/18 at 8:30AM. Please bring a photo id, social security card, and any insurance information with you. Thank You! Contact  information: [place] [address] [phone]           Follow-up recommendations:  Activity:  Activity as tolerated Diet:  Regular diet Other:  Follow-up with primary care in [facility] [place] [facility]  Comments: Prescriptions given at discharge.  Signed: Alethia Berthold, MD 12/17/2018, 12:10 PM

## 2018-12-17 NOTE — [facility] Suicide Risk Assessment (Signed)
[facility] Discharge Suicide Risk Assessment   Principal Problem: Major depressive disorder, recurrent severe without psychotic features [facility]) Discharge Diagnoses: Principal Problem:   Major depressive disorder, recurrent severe without psychotic features ([place]) Active Problems:   COPD with emphysema ([place])   Factor V Leiden ([place])   Dyslipidemia   History of CVA (cerebrovascular accident)   Ischemic cardiomyopathy   GERD (gastroesophageal reflux disease)   Opiate abuse, episodic ([place])   Total Time spent with patient: 30 minutes  Musculoskeletal: Strength & Muscle Tone: within normal limits Gait & Station: unsteady Patient leans: Front  Psychiatric Specialty Exam: Review of Systems  Constitutional: Negative.   HENT: Negative.   Eyes: Negative.   Respiratory: Negative.   Cardiovascular: Negative.   Gastrointestinal: Negative.   Musculoskeletal: Positive for joint pain.  Skin: Negative.   Neurological: Negative.   Psychiatric/Behavioral: Positive for memory loss and substance abuse. Negative for depression, hallucinations and suicidal ideas. The patient is not nervous/anxious and does not have insomnia.     Blood pressure 116/88, pulse 79, temperature (!) 97.5 F (36.4 C), temperature source Oral, resp. rate 18, height 5\' 7"  (1.702 m), weight 98.4 kg, SpO2 97 %.Body mass index is 33.99 kg/m.  General Appearance: Casual  Eye Contact::  Fair  Speech:  Clear and [id number]  Volume:  Normal  Mood:  Euthymic  Affect:  Congruent  Thought Process:  Goal Directed  Orientation:  Full (Time, Place, and Person)  Thought Content:  Logical  Suicidal Thoughts:  No  Homicidal Thoughts:  No  Memory:  Immediate;   Fair Recent;   Poor Remote;   Fair  Judgement:  Fair  Insight:  Fair  Psychomotor Activity:  Decreased  Concentration:  Fair  Recall:  [employer] of Knowledge:Fair  Language: Fair  Akathisia:  No  Handed:  Right  AIMS (if indicated):     Assets:  Desire for  Improvement Housing Resilience  Sleep:  Number of Hours: 5  Cognition: WNL  ADL's:  Intact   Mental Status Per Nursing Assessment::   On Admission:  NA  Demographic Factors:  Caucasian and Unemployed  Loss Factors: Decline in physical health  Historical Factors: Impulsivity  Risk Reduction Factors:   Sense of responsibility to family, Religious beliefs about death, Living with another person, especially a relative, Positive social support and Positive therapeutic relationship  Continued Clinical Symptoms:  Depression:   Anhedonia Alcohol/Substance Abuse/Dependencies  Cognitive Features That Contribute To Risk:  None    Suicide Risk:  Minimal: No identifiable suicidal ideation.  Patients presenting with no risk factors but with morbid ruminations; may be classified as minimal risk based on the severity of the depressive symptoms  Follow-up Information    [facility] Follow up on 12/23/2018.   Why: You have an appoitnment scheduled for 12/23/18 at 8:30AM. Please bring a photo id, social security card, and any insurance information with you. Thank You! Contact information: [place] [address] [phone]           Plan Of Care/Follow-up recommendations:  Activity:  Activity as tolerated Diet:  Regular diet Other:  [facility] team  Alethia Berthold, MD 12/17/2018, 12:05 PM

## 2018-12-17 NOTE — Plan of Care (Signed)
  Problem: Coping: Goal: Ability to verbalize frustrations and anger appropriately will improve Outcome: Adequate for Discharge Goal: Ability to demonstrate self-control will improve Outcome: Adequate for Discharge   Problem: Safety: Goal: Periods of time without injury will increase Outcome: Adequate for Discharge   Problem: Education: Goal: Knowledge of disease or condition will improve Outcome: Adequate for Discharge   Problem: Education: Goal: Ability to verbalize precipitating factors for violent behavior will improve Outcome: Adequate for Discharge   Problem: Coping: Goal: Ability to verbalize frustrations and anger appropriately will improve Outcome: Adequate for Discharge Goal: Ability to demonstrate self-control will improve Outcome: Adequate for Discharge

## 2018-12-17 NOTE — Progress Notes (Signed)
Recreation Therapy Notes  INPATIENT RECREATION TR PLAN  Patient Details Name: Lindsay Bradford MRN: [id number] DOB: August 22, 1960 Today's Date: 12/17/2018  Rec Therapy Plan Is patient appropriate for Therapeutic Recreation?: Yes Treatment times per week: at least 3 Estimated Length of Stay: 5-7 days TR Treatment/Interventions: Group participation (Comment)  Discharge Criteria Pt will be discharged from therapy if:: Discharged Treatment plan/goals/alternatives discussed and agreed upon by:: Patient/family  Discharge Summary Short term goals set: Patient will engage in groups without prompting or encouragement from LRT x3 group sessions within 5 recreation therapy group sessions Short term goals met: Adequate for discharge Progress toward goals comments: Groups attended Which groups?: Stress management, Other (Comment)(Strengths) Reason goals not met: N/A Therapeutic equipment acquired: N/A Reason patient discharged from therapy: Discharge from hospital Pt/family agrees with progress & goals achieved: Yes Date patient discharged from therapy: 12/17/18   Cherolyn Behrle 12/17/2018, 2:19 PM

## 2018-12-17 NOTE — Progress Notes (Signed)
Recreation Therapy Notes   Date: 12/17/2018  Time: 9:30 am  Location: Craft room  Behavioral response: Appropriate   Intervention Topic: Strengths  Discussion/Intervention:   Group content today was focused on strengths. The group identified some of the strengths they have. Individuals stated reason why they do not use their strengths. Patients expressed what strengths others see in them. The group identified important reason to use their strengths. The group participated in the intervention "Picking strengths", where they had a chance to identify some of their strengths. Clinical Observations/Feedback:  Patient came to group and explained that strengths come from inside. She explained that her strengths developed through her life experiences. Participant identified her strengths as optimistic, patience, kindness and compassion. Individual participated in the intervention and was social with peers and staff during group. Keani Gotcher LRT/CTRS         Audryna Wendt 12/17/2018 12:01 PM

## 2018-12-17 NOTE — Progress Notes (Signed)
  [facility] Adult Case Management Discharge Plan :  Will you be returning to the same living situation after discharge:  Yes,  lives with son At discharge, do you have transportation home?: Yes,  daughter will pick up Do you have the ability to pay for your medications: Yes,  medicare  Release of information consent forms completed and in the chart;  Patient's signature needed at discharge.  Patient to Follow up at: Follow-up Information    [facility] Follow up on 12/23/2018.   Why: You have an appoitnment scheduled for 12/23/18 at 8:30AM. Please bring a photo id, social security card, and any insurance information with you. Thank You! Contact information: [place] [address] [phone]           Next level of care provider has access to [place] and Suicide Prevention discussed: Yes,  pt daughter  Have you used any form of tobacco in the last 30 days? (Cigarettes, Smokeless Tobacco, Cigars, and/or Pipes): Yes  Has patient been referred to the Quitline?: Patient refused referral  Patient has been referred for addiction treatment: Pt. refused referral  Yvette Rack, LCSW 12/17/2018, 12:46 PM

## 2018-12-17 NOTE — Plan of Care (Signed)
  Problem: Group Participation Goal: STG - Patient will engage in groups without prompting or encouragement from LRT x3 group sessions within 5 recreation therapy group sessions Description: STG - Patient will engage in groups without prompting or encouragement from LRT x3 group sessions within 5 recreation therapy group sessions 12/17/2018 1418 by Ernest Haber, LRT Outcome: Adequate for Discharge 12/17/2018 1418 by Ernest Haber, LRT Outcome: Adequate for Discharge

## 2018-12-19 ENCOUNTER — Ambulatory Visit: Payer: Medicare HMO | Admitting: Nurse Practitioner

## 2018-12-19 ENCOUNTER — Other Ambulatory Visit: Payer: Self-pay

## 2018-12-19 DIAGNOSIS — S82851A Displaced trimalleolar fracture of right lower leg, initial encounter for closed fracture: Secondary | ICD-10-CM | POA: Diagnosis not present

## 2018-12-20 ENCOUNTER — Encounter: Payer: Self-pay | Admitting: Family

## 2018-12-20 ENCOUNTER — Ambulatory Visit (INDEPENDENT_AMBULATORY_CARE_PROVIDER_SITE_OTHER): Payer: Medicare HMO | Admitting: Family

## 2018-12-20 DIAGNOSIS — F17200 Nicotine dependence, unspecified, uncomplicated: Secondary | ICD-10-CM

## 2018-12-20 DIAGNOSIS — S82851D Displaced trimalleolar fracture of right lower leg, subsequent encounter for closed fracture with routine healing: Secondary | ICD-10-CM | POA: Diagnosis not present

## 2018-12-20 DIAGNOSIS — J439 Emphysema, unspecified: Secondary | ICD-10-CM | POA: Diagnosis not present

## 2018-12-20 DIAGNOSIS — F331 Major depressive disorder, recurrent, moderate: Secondary | ICD-10-CM | POA: Diagnosis not present

## 2018-12-20 DIAGNOSIS — F332 Major depressive disorder, recurrent severe without psychotic features: Secondary | ICD-10-CM

## 2018-12-20 DIAGNOSIS — I2510 Atherosclerotic heart disease of native coronary artery without angina pectoris: Secondary | ICD-10-CM | POA: Diagnosis not present

## 2018-12-20 DIAGNOSIS — M797 Fibromyalgia: Secondary | ICD-10-CM | POA: Diagnosis not present

## 2018-12-20 DIAGNOSIS — M1990 Unspecified osteoarthritis, unspecified site: Secondary | ICD-10-CM | POA: Diagnosis not present

## 2018-12-20 DIAGNOSIS — Z09 Encounter for follow-up examination after completed treatment for conditions other than malignant neoplasm: Secondary | ICD-10-CM | POA: Diagnosis not present

## 2018-12-20 DIAGNOSIS — S82491D Other fracture of shaft of right fibula, subsequent encounter for closed fracture with routine healing: Secondary | ICD-10-CM | POA: Diagnosis not present

## 2018-12-20 DIAGNOSIS — I739 Peripheral vascular disease, unspecified: Secondary | ICD-10-CM | POA: Diagnosis not present

## 2018-12-20 DIAGNOSIS — M4317 Spondylolisthesis, lumbosacral region: Secondary | ICD-10-CM | POA: Diagnosis not present

## 2018-12-20 LAB — AEROBIC/ANAEROBIC CULTURE W GRAM STAIN (SURGICAL/DEEP WOUND)
Culture: NO GROWTH
Gram Stain: NONE SEEN

## 2018-12-20 MED ORDER — VENLAFAXINE HCL ER 75 MG PO CP24: 75.0000 mg | ORAL_CAPSULE | Freq: Every day | ORAL | 1 refills | Status: DC

## 2018-12-20 MED ORDER — VENLAFAXINE HCL ER 75 MG PO CP24: 75.0000 mg | ORAL_CAPSULE | Freq: Every day | ORAL | 3 refills | Status: DC

## 2018-12-20 NOTE — Progress Notes (Signed)
   Virtual Visit via telephone Note Due to COVID-19 pandemic this visit was conducted virtually. This visit type was conducted due to national recommendations for restrictions regarding the COVID-19 Pandemic (e.g. social distancing, sheltering in place) in an effort to limit this patient's exposure and mitigate transmission in our community. All issues noted in this document were discussed and addressed.  A physical exam was not performed with this format.  I connected with Lindsay Bradford on 12/20/18 at 12:50 pm  by telephone and verified that I am speaking with the correct person using two identifiers. Lindsay Bradford is currently located at home  and no one is currently with her during visit. The provider, Evelina Dun, FNP is located in their office at time of visit.  I discussed the limitations, risks, security and privacy concerns of performing an evaluation and management service by telephone and the availability of in person appointments. I also discussed with the patient that there may be a patient responsible charge related to this service. The patient expressed understanding and agreed to proceed.   History and Present Illness:  Pt calls the office today for GAD and Depression and hospital follow up. She has [facility] 12/22/18. She was recently admitted to the ED with major depression and SI on 11/26 and was discharged on 12/01. States she is doing much better and feels like she has "new perspective". Denies any SI.  Anxiety Presents for follow-up visit. Symptoms include depressed mood, excessive worry, nervous/anxious behavior and restlessness. Symptoms occur occasionally. The severity of symptoms is moderate.    Depression        This is a chronic problem.  The current episode started more than 1 year ago.   The onset quality is gradual.   The problem occurs intermittently.  Associated symptoms include irritable, restlessness, decreased interest and sad.  Associated symptoms  include no helplessness and no hopelessness.  Past medical history includes anxiety.       Review of Systems  Psychiatric/Behavioral: Positive for depression. The patient is nervous/anxious.      Observations/Objective: No SOB or distress noted   Assessment and Plan: 1. Major depressive disorder, recurrent severe without psychotic features ([place]) Will refill Effexor 75 mg today since she is out of medication [facility] appt 12/23/18 Go to ED if having any SI  - venlafaxine XR (EFFEXOR-XR) 75 MG 24 hr capsule; Take 1 capsule (75 mg total) by mouth daily with breakfast.  Dispense: 90 capsule; Refill: 3  2. Hospital discharge follow-up Hospital notes reviewed  3. Current smoker Smoking cessation discussed       I discussed the assessment and treatment plan with the patient. The patient was provided an opportunity to ask questions and all were answered. The patient agreed with the plan and demonstrated an understanding of the instructions.   The patient was advised to call back or seek an in-person evaluation if the symptoms worsen or if the condition fails to improve as anticipated.  The above assessment and management plan was discussed with the patient. The patient verbalized understanding of and has agreed to the management plan. Patient is aware to call the clinic if symptoms persist or worsen. Patient is aware when to return to the clinic for a follow-up visit. Patient educated on when it is appropriate to go to the emergency department.   Time call ended:  1:19 pm  I provided 29 minutes of non-face-to-face time during this encounter.    Evelina Dun, FNP

## 2018-12-25 DIAGNOSIS — S82491D Other fracture of shaft of right fibula, subsequent encounter for closed fracture with routine healing: Secondary | ICD-10-CM | POA: Diagnosis not present

## 2018-12-25 DIAGNOSIS — M1990 Unspecified osteoarthritis, unspecified site: Secondary | ICD-10-CM | POA: Diagnosis not present

## 2018-12-25 DIAGNOSIS — I2510 Atherosclerotic heart disease of native coronary artery without angina pectoris: Secondary | ICD-10-CM | POA: Diagnosis not present

## 2018-12-25 DIAGNOSIS — S82851D Displaced trimalleolar fracture of right lower leg, subsequent encounter for closed fracture with routine healing: Secondary | ICD-10-CM | POA: Diagnosis not present

## 2018-12-25 DIAGNOSIS — J439 Emphysema, unspecified: Secondary | ICD-10-CM | POA: Diagnosis not present

## 2018-12-25 DIAGNOSIS — I255 Ischemic cardiomyopathy: Secondary | ICD-10-CM | POA: Diagnosis not present

## 2018-12-25 DIAGNOSIS — M797 Fibromyalgia: Secondary | ICD-10-CM | POA: Diagnosis not present

## 2018-12-25 DIAGNOSIS — M4317 Spondylolisthesis, lumbosacral region: Secondary | ICD-10-CM | POA: Diagnosis not present

## 2018-12-25 DIAGNOSIS — I739 Peripheral vascular disease, unspecified: Secondary | ICD-10-CM | POA: Diagnosis not present

## 2018-12-27 MED ORDER — NICOTINE 21 MG/24HR TD PT24: 21.0000 mg | MEDICATED_PATCH | Freq: Every day | TRANSDERMAL | 0 refills | Status: DC

## 2018-12-27 NOTE — Telephone Encounter (Signed)
Nicotine Prescription sent to pharmacy  ? ?

## 2018-12-27 NOTE — Telephone Encounter (Signed)
Patient is requesting a letter stating that she needs a letter for her therapist requesting the to waive her copay if not it is going to be $200.

## 2018-12-27 NOTE — Telephone Encounter (Signed)
PATIENT WANTS AN RX TO QUIT SMOKING SENT TO [facility] [place]. PATIENT WOULD LIKE TO STOP BT 01/17/2019

## 2018-12-30 ENCOUNTER — Other Ambulatory Visit: Payer: Self-pay | Admitting: *Deleted

## 2018-12-30 NOTE — Telephone Encounter (Signed)
Pt emailed copy of current meds  - list was updated

## 2018-12-31 ENCOUNTER — Ambulatory Visit (INDEPENDENT_AMBULATORY_CARE_PROVIDER_SITE_OTHER): Payer: Medicare HMO | Admitting: Licensed Clinical Social Worker

## 2018-12-31 DIAGNOSIS — F332 Major depressive disorder, recurrent severe without psychotic features: Secondary | ICD-10-CM

## 2018-12-31 DIAGNOSIS — J439 Emphysema, unspecified: Secondary | ICD-10-CM | POA: Diagnosis not present

## 2018-12-31 DIAGNOSIS — I2510 Atherosclerotic heart disease of native coronary artery without angina pectoris: Secondary | ICD-10-CM | POA: Diagnosis not present

## 2018-12-31 DIAGNOSIS — K219 Gastro-esophageal reflux disease without esophagitis: Secondary | ICD-10-CM

## 2018-12-31 DIAGNOSIS — Z86718 Personal history of other venous thrombosis and embolism: Secondary | ICD-10-CM

## 2018-12-31 DIAGNOSIS — Z8673 Personal history of transient ischemic attack (TIA), and cerebral infarction without residual deficits: Secondary | ICD-10-CM

## 2018-12-31 DIAGNOSIS — S82851D Displaced trimalleolar fracture of right lower leg, subsequent encounter for closed fracture with routine healing: Secondary | ICD-10-CM | POA: Diagnosis not present

## 2018-12-31 NOTE — Chronic Care Management (AMB) (Addendum)
Care Management Note   Lindsay Bradford is a 58 y.o. year old female who is a primary care patient of Lindsay Spencer, FNP. The CM team was consulted for assistance with chronic disease management and care coordination.   I reached out to Lindsay Bradford by phone today.     Review of patient status, including review of consultants reports, relevant laboratory and other test results, and collaboration with appropriate care team members and the patient's provider was performed as part of comprehensive patient evaluation and provision of chronic care management services.   Social determinants of health: risk of social isolation; risk of tobacco use; risk of financial strain; risk of depression;  risk of food insecurity; risk of physical inactivity    Office Visit from 11/19/2018 in [facility]  PHQ-9 Total Score  18      GAD 7 : Generalized Anxiety Score 10/30/2018  Nervous, Anxious, on Edge 1  Control/stop worrying 1  Worry too much - different things 1  Trouble relaxing 1  Restless 1  Easily annoyed or irritable 0  Afraid - awful might happen 0  Total GAD 7 Score 5  Anxiety Difficulty Somewhat difficult   Medications    (very important)  New medications from outside sources are available for reconciliation   albuterol (VENTOLIN HFA) 108 (90 Base) MCG/ACT inhaler aspirin EC 81 MG EC tablet baclofen (LIORESAL) 20 MG tablet carvedilol (COREG) 12.5 MG tablet DULoxetine (CYMBALTA) 60 MG capsule famciclovir (FAMVIR) 500 MG tablet HYDROcodone-acetaminophen (NORCO/VICODIN) 5-325 MG tablet methocarbamol (ROBAXIN) 500 MG tablet nabumetone (RELAFEN) 500 MG tablet nitroGLYCERIN (NITROSTAT) 0.4 MG SL tablet omega-3 acid ethyl esters (LOVAZA) 1 g capsule pantoprazole (PROTONIX) 40 MG tablet pregabalin (LYRICA) 75 MG capsule ranolazine (RANEXA) 500 MG 12 hr tablet rivaroxaban (XARELTO) 20 MG TABS tablet rosuvastatin (CRESTOR) 40 MG tablet tiotropium (SPIRIVA) 18 MCG  inhalation capsule traZODone (DESYREL) 150 MG tablet valACYclovir (VALTREX) 500 MG tablet venlafaxine XR (EFFEXOR-XR) 75 MG 24 hr capsule   Goals Addressed             This Visit's Progress    Client will talk with LCSW in next 30 days to discuss community resources of help in area (pt-stated)       Current Barriers:  Mobility challenges (uses wheelchair to ambulate) in patient with chronic diagnoses of GERD, Major Depressive Disorder, Hx DVT, Hx CVA, COPD and CAD Needs help with ADLs  Clinical Social Work Clinical Goal(s):  Client and LCSW to talk in next 30 days to discuss community resources of help to client  Interventions: Talked previously with Lindsay Bradford about CCM program and services Talked with Lindsay Bradford previously about her recent surgery on right ankle Previously talked with Lindsay Bradford about her mobility challenges (uses wheelchair) Previously talked with Lindsay Bradford about in home support services through [employer] (LCSW provided Lindsay Bradford with number for [employer]) Talked previously with Lindsay Bradford about transport needs of client Talked with Lindsay Bradford previously about social support network (receives support from her son, Lindsay Bradford,; receives support from grandson, and receives some help from her daughter)   Patient Self Care Activities:  Takes medications as prescribed Attends scheduled medical appointments   Plan:  LCSW to call client in next 4 weeks to talk with her about community resources of help to client Client to attends scheduled medical appointments Client to communicate with RNCM as needed to discuss nursing needs Client to participate in physical therapy sessions as scheduled   Initial goal documentation  Follow Up Plan: LCSW to call client in next 4 weeks to talk with her about community resources of help to client  Lindsay Bradford.Lindsay Bradford MSW, LCSW Licensed Clinical Social Worker [facility] [phone]  I have reviewed and agree with the above   documentation.   Lindsay Dun, FNP

## 2018-12-31 NOTE — Patient Instructions (Signed)
Licensed Clinical Social Worker Visit Information  Goals we discussed today:  Goals Addressed            This Visit's Progress   . Client will talk with LCSW in next 30 days to discuss community resources of help in area (pt-stated)       Current Barriers:  Lindsay Bradford Kitchen Mobility challenges (uses wheelchair to ambulate) in patient with chronic diagnoses of GERD, Major Depressive Disorder, Hx DVT, Hx CVA, COPD and CAD . Needs help with ADLs  Clinical Social Work Clinical Goal(s):  . Client and LCSW to talk in next 30 days to discuss community resources of help to client  Interventions: . Talked with Special previously about CCM program and services . Talked with Lindsay Bradford previously about her recent surgery on right ankle . Previously talked with Lindsay Bradford about her mobility challenges (uses wheelchair) . Previously talked with Lindsay Bradford about in home support services through [place] (LCSW provided Lindsay Bradford with number for [employer]) . Talked previously with Lindsay Bradford about transport needs of client . Talked previously with Lindsay Bradford about social support network (receives support from her son, Lindsay Bradford,; receives support from grandson, and receives some help from her daughter)   Patient Self Care Activities:  . Takes medications as prescribed . Attends scheduled medical appointments   Plan:  LCSW to call client in next 4 weeks to talk with her about community resources of help to client Client to attends scheduled medical appointments Client to communicate with RNCM as needed to discuss nursing needs Client to participate in physical therapy sessions as scheduled .   Initial goal documentation          Materials Provided: No  Follow Up Plan: LCSW to call client in next 4 weeks to talk with her about community resources of help to client   The patient verbalized understanding of instructions provided today and declined a print copy of patient instruction materials.   Lindsay Bradford.Lindsay Bradford MSW, LCSW Licensed Clinical Social  Worker [facility] [phone]

## 2019-01-03 ENCOUNTER — Other Ambulatory Visit: Payer: Self-pay

## 2019-01-03 ENCOUNTER — Encounter: Payer: Self-pay | Admitting: Physician Assistant

## 2019-01-03 ENCOUNTER — Ambulatory Visit (INDEPENDENT_AMBULATORY_CARE_PROVIDER_SITE_OTHER): Payer: Medicare HMO | Admitting: Physician Assistant

## 2019-01-03 DIAGNOSIS — I2510 Atherosclerotic heart disease of native coronary artery without angina pectoris: Secondary | ICD-10-CM | POA: Diagnosis not present

## 2019-01-03 DIAGNOSIS — J439 Emphysema, unspecified: Secondary | ICD-10-CM | POA: Diagnosis not present

## 2019-01-03 DIAGNOSIS — S82851D Displaced trimalleolar fracture of right lower leg, subsequent encounter for closed fracture with routine healing: Secondary | ICD-10-CM | POA: Diagnosis not present

## 2019-01-03 DIAGNOSIS — I255 Ischemic cardiomyopathy: Secondary | ICD-10-CM | POA: Diagnosis not present

## 2019-01-03 DIAGNOSIS — M4317 Spondylolisthesis, lumbosacral region: Secondary | ICD-10-CM | POA: Diagnosis not present

## 2019-01-03 DIAGNOSIS — N3001 Acute cystitis with hematuria: Secondary | ICD-10-CM | POA: Diagnosis not present

## 2019-01-03 DIAGNOSIS — M797 Fibromyalgia: Secondary | ICD-10-CM | POA: Diagnosis not present

## 2019-01-03 DIAGNOSIS — I739 Peripheral vascular disease, unspecified: Secondary | ICD-10-CM | POA: Diagnosis not present

## 2019-01-03 DIAGNOSIS — S82491D Other fracture of shaft of right fibula, subsequent encounter for closed fracture with routine healing: Secondary | ICD-10-CM | POA: Diagnosis not present

## 2019-01-03 DIAGNOSIS — M1990 Unspecified osteoarthritis, unspecified site: Secondary | ICD-10-CM | POA: Diagnosis not present

## 2019-01-03 MED ORDER — NITROFURANTOIN MONOHYD MACRO 100 MG PO CAPS: 100.0000 mg | ORAL_CAPSULE | Freq: Two times a day (BID) | ORAL | 0 refills | Status: DC

## 2019-01-03 NOTE — Progress Notes (Signed)
Telephone visit  Subjective: CC:uri PCP: Sharion Balloon, FNP CZY:SAYT Lindsay Bradford is a 58 y.o. female calls for telephone consult today. Patient provides verbal consent for consult held via phone.  Patient is identified with 2 separate identifiers.  At this time the entire area is on COVID-19 social distancing and stay home orders are in place.  Patient is of higher risk and therefore we are performing this by a virtual method.  Location of patient: home Location of provider: [facility] Others present for call: no  This patient has had almost 2 weeks of dysuria, frequency and nocturia. There is also pain over the bladder in the suprapubic region, no back pain. Denies leakage or hematuria.  Denies fever or chills. No pain in flank area.    ROS: Per HPI  Allergies  Allergen Reactions  . Cephalexin Hives  . Clarithromycin Hives  . Ibuprofen Hives  . Other Itching and Other (See Comments)    Whole milk and oranges  . Oxymorphone Palpitations  . Penicillins Anaphylaxis    Did it involve swelling of the face/tongue/throat, SOB, or low BP? Yes Did it involve sudden or severe rash/hives, skin peeling, or any reaction on the inside of your mouth or nose? Yes Did you need to seek medical attention at a hospital or doctor's office? Yes When did it last happen?childhood allergy If all above answers are "NO", may proceed with cephalosporin use.   [name] Potassium Chloride Hives    Reacts to oral KCl, tolerates it slow IV   . Sulfa Antibiotics Anaphylaxis  . Clindamycin Hives and Itching  . Fentanyl Hives and Other (See Comments)    Fentanyl patch, it breaks pt out in hives and lowers her blood pressure.   . Naproxen Hives  . Iodinated Diagnostic Agents Hives  . Lyrica [Pregabalin]     Chest pain  . Tetracycline Hives and Itching   Past Medical History:  Diagnosis Date  . Arthritis   . Asthma   . Bursitis of left hip   . COPD (chronic obstructive pulmonary disease) ([place])   .  Coronary artery disease   . Depression   . DVT (deep venous thrombosis) ([facility])    left  . Factor V Leiden ([place])   . Fibromyalgia   . GERD (gastroesophageal reflux disease)   . Headache    sesaonal  . Herpes simplex type 2 infection   . History of blood transfusion   . Hyperlipidemia   . Hypotension    90's, Normally; in pain normal; BP  . Ischemic cardiomyopathy   . MI (myocardial infarction) ([place])    x 2 2000   . Pneumonia   . Stroke ([place]) 02/27/2018   no residual    Current Outpatient Medications:  .  albuterol (VENTOLIN HFA) 108 (90 Base) MCG/ACT inhaler, Inhale 1-2 puffs into the lungs every 6 (six) hours as needed for wheezing or shortness of breath., Disp: 8 g, Rfl: 1 .  aspirin EC 81 MG EC tablet, Take 1 tablet (81 mg total) by mouth daily., Disp: 30 tablet, Rfl: 1 .  baclofen (LIORESAL) 20 MG tablet, Take 20 mg by mouth 3 (three) times daily., Disp: , Rfl:  .  carvedilol (COREG) 12.5 MG tablet, Take 1 tablet (12.5 mg total) by mouth 2 (two) times daily with a meal., Disp: 60 tablet, Rfl: 1 .  DULoxetine (CYMBALTA) 60 MG capsule, Take 60 mg by mouth daily., Disp: , Rfl:  .  famciclovir (FAMVIR) 500 MG tablet, Take  by mouth 3 (three) times daily., Disp: , Rfl:  .  HYDROcodone-acetaminophen (NORCO/VICODIN) 5-325 MG tablet, Take 1 tablet by mouth every 12 (twelve) hours as needed for moderate pain., Disp: , Rfl:  .  methocarbamol (ROBAXIN) 500 MG tablet, Take 500 mg by mouth every 6 (six) hours as needed for muscle spasms., Disp: , Rfl:  .  nabumetone (RELAFEN) 500 MG tablet, Take 1,000 mg by mouth 2 (two) times daily., Disp: , Rfl:  .  nitrofurantoin, macrocrystal-monohydrate, (MACROBID) 100 MG capsule, Take 1 capsule (100 mg total) by mouth 2 (two) times daily. 1 po BId, Disp: 14 capsule, Rfl: 0 .  nitroGLYCERIN (NITROSTAT) 0.4 MG SL tablet, Place 1 tablet (0.4 mg total) under the tongue every 5 (five) minutes as needed for chest pain., Disp: 30 tablet, Rfl: 1 .  omega-3 acid  ethyl esters (LOVAZA) 1 g capsule, Take 1 capsule (1 g total) by mouth 2 (two) times daily., Disp: 60 capsule, Rfl: 1 .  pantoprazole (PROTONIX) 40 MG tablet, Take 1 tablet (40 mg total) by mouth daily., Disp: 30 tablet, Rfl: 1 .  pregabalin (LYRICA) 75 MG capsule, Take 75 mg by mouth 3 (three) times daily., Disp: , Rfl:  .  ranolazine (RANEXA) 500 MG 12 hr tablet, Take 500 mg by mouth 2 (two) times daily., Disp: , Rfl:  .  rivaroxaban (XARELTO) 20 MG TABS tablet, Take 1 tablet (20 mg total) by mouth daily., Disp: 30 tablet, Rfl: 1 .  rosuvastatin (CRESTOR) 40 MG tablet, Take 1 tablet (40 mg total) by mouth daily., Disp: 30 tablet, Rfl: 1 .  tiotropium (SPIRIVA) 18 MCG inhalation capsule, Place 1 capsule (18 mcg total) into inhaler and inhale daily., Disp: 30 capsule, Rfl: 1 .  traZODone (DESYREL) 150 MG tablet, Take by mouth at bedtime., Disp: , Rfl:  .  valACYclovir (VALTREX) 500 MG tablet, Take 1 tablet (500 mg total) by mouth daily., Disp: 30 tablet, Rfl: 1 .  venlafaxine XR (EFFEXOR-XR) 75 MG 24 hr capsule, Take 1 capsule (75 mg total) by mouth daily with breakfast., Disp: 90 capsule, Rfl: 3  Assessment/ Plan: 58 y.o. female   1. Acute cystitis with hematuria - nitrofurantoin, macrocrystal-monohydrate, (MACROBID) 100 MG capsule; Take 1 capsule (100 mg total) by mouth 2 (two) times daily. 1 po BId  Dispense: 14 capsule; Refill: 0   No follow-ups on file.  Continue all other maintenance medications as listed above.  Start time: 4:38 PM End time: 4:46 PM  Meds ordered this encounter  Medications  . nitrofurantoin, macrocrystal-monohydrate, (MACROBID) 100 MG capsule    Sig: Take 1 capsule (100 mg total) by mouth 2 (two) times daily. 1 po BId    Dispense:  14 capsule    Refill:  0    Order Specific Question:   Supervising Provider    Answer:   Raliegh Ip [[id number]]    Prudy Feeler PA-C [facility] [phone]

## 2019-01-09 DIAGNOSIS — I2510 Atherosclerotic heart disease of native coronary artery without angina pectoris: Secondary | ICD-10-CM | POA: Diagnosis not present

## 2019-01-09 DIAGNOSIS — I255 Ischemic cardiomyopathy: Secondary | ICD-10-CM | POA: Diagnosis not present

## 2019-01-09 DIAGNOSIS — R072 Precordial pain: Secondary | ICD-10-CM | POA: Diagnosis not present

## 2019-01-09 DIAGNOSIS — I252 Old myocardial infarction: Secondary | ICD-10-CM | POA: Diagnosis not present

## 2019-01-12 ENCOUNTER — Other Ambulatory Visit: Payer: Self-pay | Admitting: Family Medicine

## 2019-01-12 ENCOUNTER — Other Ambulatory Visit: Payer: Self-pay | Admitting: Physician Assistant

## 2019-01-12 DIAGNOSIS — N3001 Acute cystitis with hematuria: Secondary | ICD-10-CM

## 2019-01-13 ENCOUNTER — Other Ambulatory Visit: Payer: Self-pay | Admitting: Nurse Practitioner

## 2019-01-14 ENCOUNTER — Telehealth: Payer: Self-pay | Admitting: Family

## 2019-01-14 NOTE — Telephone Encounter (Signed)
Patient c/o burning with urination and urgency. Patient was just given macrobid for uti and does not think it ever cleared up. We have no appointments. Patient is [facility] [place] covering pcp please advise

## 2019-01-14 NOTE — Telephone Encounter (Signed)
Need to be seen- need to check urine

## 2019-01-15 NOTE — Telephone Encounter (Signed)
lmtcb

## 2019-01-19 DIAGNOSIS — S82851A Displaced trimalleolar fracture of right lower leg, initial encounter for closed fracture: Secondary | ICD-10-CM | POA: Diagnosis not present

## 2019-01-20 ENCOUNTER — Encounter: Payer: Self-pay | Admitting: Family

## 2019-01-20 ENCOUNTER — Ambulatory Visit (INDEPENDENT_AMBULATORY_CARE_PROVIDER_SITE_OTHER): Payer: Medicare HMO | Admitting: Family

## 2019-01-20 ENCOUNTER — Other Ambulatory Visit: Payer: Self-pay

## 2019-01-20 VITALS — BP 103/77 | HR 85 | Temp 97.5°F | Ht 67.0 in | Wt 207.0 lb

## 2019-01-20 DIAGNOSIS — I739 Peripheral vascular disease, unspecified: Secondary | ICD-10-CM | POA: Diagnosis not present

## 2019-01-20 DIAGNOSIS — Z955 Presence of coronary angioplasty implant and graft: Secondary | ICD-10-CM

## 2019-01-20 DIAGNOSIS — R300 Dysuria: Secondary | ICD-10-CM

## 2019-01-20 DIAGNOSIS — Z9861 Coronary angioplasty status: Secondary | ICD-10-CM | POA: Diagnosis not present

## 2019-01-20 DIAGNOSIS — M1990 Unspecified osteoarthritis, unspecified site: Secondary | ICD-10-CM

## 2019-01-20 DIAGNOSIS — I472 Ventricular tachycardia: Secondary | ICD-10-CM

## 2019-01-20 DIAGNOSIS — Z716 Tobacco abuse counseling: Secondary | ICD-10-CM

## 2019-01-20 DIAGNOSIS — I255 Ischemic cardiomyopathy: Secondary | ICD-10-CM

## 2019-01-20 DIAGNOSIS — Z8673 Personal history of transient ischemic attack (TIA), and cerebral infarction without residual deficits: Secondary | ICD-10-CM | POA: Diagnosis not present

## 2019-01-20 DIAGNOSIS — S82851A Displaced trimalleolar fracture of right lower leg, initial encounter for closed fracture: Secondary | ICD-10-CM

## 2019-01-20 DIAGNOSIS — Z86718 Personal history of other venous thrombosis and embolism: Secondary | ICD-10-CM | POA: Diagnosis not present

## 2019-01-20 DIAGNOSIS — I2510 Atherosclerotic heart disease of native coronary artery without angina pectoris: Secondary | ICD-10-CM

## 2019-01-20 DIAGNOSIS — F332 Major depressive disorder, recurrent severe without psychotic features: Secondary | ICD-10-CM

## 2019-01-20 DIAGNOSIS — Z1212 Encounter for screening for malignant neoplasm of rectum: Secondary | ICD-10-CM

## 2019-01-20 DIAGNOSIS — Z1211 Encounter for screening for malignant neoplasm of colon: Secondary | ICD-10-CM

## 2019-01-20 DIAGNOSIS — J439 Emphysema, unspecified: Secondary | ICD-10-CM

## 2019-01-20 DIAGNOSIS — S82851D Displaced trimalleolar fracture of right lower leg, subsequent encounter for closed fracture with routine healing: Secondary | ICD-10-CM

## 2019-01-20 DIAGNOSIS — I4729 Other ventricular tachycardia: Secondary | ICD-10-CM

## 2019-01-20 DIAGNOSIS — E785 Hyperlipidemia, unspecified: Secondary | ICD-10-CM

## 2019-01-20 DIAGNOSIS — K219 Gastro-esophageal reflux disease without esophagitis: Secondary | ICD-10-CM

## 2019-01-20 DIAGNOSIS — D6851 Activated protein C resistance: Secondary | ICD-10-CM

## 2019-01-20 LAB — URINALYSIS, COMPLETE
Bilirubin, UA: NEGATIVE
Glucose, UA: NEGATIVE
Ketones, UA: NEGATIVE
Nitrite, UA: NEGATIVE
Specific Gravity, UA: >1.03 — ABNORMAL HIGH (ref 1.005–1.030)
Urobilinogen, Ur: 0.2 mg/dL (ref 0.2–1.0)
pH, UA: 5.5 (ref 5.0–7.5)

## 2019-01-20 LAB — MICROSCOPIC EXAMINATION: Epithelial Cells (non renal): >10 /hpf — AB (ref 0–10)

## 2019-01-20 MED ORDER — NICOTINE 14 MG/24HR TD PT24: 14.0000 mg | MEDICATED_PATCH | Freq: Every day | TRANSDERMAL | 0 refills | Status: DC

## 2019-01-20 MED ORDER — NITROFURANTOIN MONOHYD MACRO 100 MG PO CAPS: 100.0000 mg | ORAL_CAPSULE | Freq: Two times a day (BID) | ORAL | 0 refills | Status: DC

## 2019-01-20 NOTE — Telephone Encounter (Signed)
Patient seen today

## 2019-01-20 NOTE — Addendum Note (Signed)
Addended by: Jannifer Rodney A on: 01/20/2019 12:20 PM   Modules accepted: Orders

## 2019-01-20 NOTE — Addendum Note (Signed)
Addended by: Jannifer Rodney A on: 01/20/2019 03:12 PM   Modules accepted: Orders

## 2019-01-20 NOTE — Patient Instructions (Signed)

## 2019-01-20 NOTE — Progress Notes (Addendum)
Subjective:    Patient ID: Lindsay Bradford, female    DOB: November 09, 1960, 59 y.o.   MRN: [id number]  Chief Complaint  Patient presents with  . Medical Management of Chronic Issues    check up   PT presents to the office today for chronic follow up. She is followed [facility] for depression and anxiety.   She is followed by Cardiologists annually for CAD, cardiomyopathy, NSVT.   She fractured of her right ankle in 09/2018. She is followed by Ortho.   She is currently seeing the chiropractor three times a week for chronic back and neck pain.   She is requesting nicotine patches for smoking cessation.  Hyperlipidemia This is a chronic problem. The current episode started more than 1 year ago. The problem is controlled. Recent lipid tests were reviewed and are normal. Exacerbating diseases include obesity. Current antihyperlipidemic treatment includes statins. The current treatment provides moderate improvement of lipids. Risk factors for coronary artery disease include dyslipidemia, hypertension, a sedentary lifestyle and post-menopausal.  Depression        This is a chronic problem.  The current episode started more than 1 year ago.   The onset quality is gradual.   The problem occurs intermittently.  Associated symptoms include insomnia, irritable, restlessness and sad.  Associated symptoms include no fatigue, no helplessness and no hopelessness.  Past treatments include SNRIs - Serotonin and norepinephrine reuptake inhibitors. Gastroesophageal Reflux She complains of belching, heartburn and a hoarse voice. She reports no nausea. This is a chronic problem. The current episode started more than 1 year ago. The problem occurs occasionally. The problem has been waxing and waning. Pertinent negatives include no fatigue. She has tried a PPI for the symptoms.  Insomnia Primary symptoms: difficulty falling asleep, frequent awakening.  The current episode started more than one year. The onset  quality is gradual. PMH includes: depression.  Nicotine Dependence Presents for follow-up visit. Symptoms include insomnia. Symptoms are negative for fatigue. Her urge triggers include company of smokers. She smokes < 1/2 a pack of cigarettes per day. Compliance with prior treatments has been good.  Dysuria  This is a recurrent problem. The current episode started 1 to 4 weeks ago. The problem occurs intermittently. The problem has been waxing and waning. The pain is at a severity of 7/10. The pain is mild. Associated symptoms include frequency, hesitancy and urgency. Pertinent negatives include no chills, discharge, flank pain, hematuria or nausea. She has tried antibiotics for the symptoms. The treatment provided mild relief.  Arthritis Presents for follow-up visit. She complains of pain and joint swelling. The symptoms have been stable. Affected locations include the neck and right ankle (back). Her pain is at a severity of 10/10. Associated symptoms include dysuria. Pertinent negatives include no fatigue.  HX DVT and Factor V Leiden Pt taking Xarelto 20 mg daily COPD PT continues to smoke 1/2 pack a day. She is wanting to quit. She is using the Spiriva daily. States her SOB is the stable.     Review of Systems  Constitutional: Negative for chills and fatigue.  HENT: Positive for hoarse voice.   Gastrointestinal: Positive for heartburn. Negative for nausea.  Genitourinary: Positive for dysuria, frequency, hesitancy and urgency. Negative for flank pain and hematuria.  Musculoskeletal: Positive for arthritis and joint swelling.  Psychiatric/Behavioral: Positive for depression. The patient has insomnia.   All other systems reviewed and are negative.      Objective:   Physical Exam Vitals reviewed.  Constitutional:  General: She is irritable. She is not in acute distress.    Appearance: She is well-developed.  HENT:     Head: Normocephalic and atraumatic.     Right Ear: Tympanic  membrane normal.     Left Ear: Tympanic membrane normal.  Eyes:     Pupils: Pupils are equal, round, and reactive to light.  Neck:     Thyroid: No thyromegaly.  Cardiovascular:     Rate and Rhythm: Normal rate and regular rhythm.     Heart sounds: Normal heart sounds. No murmur.  Pulmonary:     Effort: Pulmonary effort is normal. No respiratory distress.     Breath sounds: Normal breath sounds. No wheezing.  Abdominal:     General: Bowel sounds are normal. There is no distension.     Palpations: Abdomen is soft.     Tenderness: There is no abdominal tenderness.  Musculoskeletal:        General: No tenderness.     Cervical back: Normal range of motion and neck supple.     Comments: Pain in right ankle with flexion and extension. She is in wheelchair, but uses cane to walk.   Skin:    General: Skin is warm and dry.  Neurological:     Mental Status: She is alert and oriented to person, place, and time.     Cranial Nerves: No cranial nerve deficit.     Deep Tendon Reflexes: Reflexes are normal and symmetric.  Psychiatric:        Behavior: Behavior normal.        Thought Content: Thought content normal.        Judgment: Judgment normal.      BP 103/77   Pulse 85   Temp (!) 97.5 F (36.4 C) (Temporal)   Ht 5' 7"  (1.702 m)   Wt 207 lb (93.9 kg)   SpO2 90%   BMI 32.42 kg/m       Assessment & Plan:  Lindsay Bradford comes in today with chief complaint of Medical Management of Chronic Issues (check up)   Diagnosis and orders addressed:  1. CAD S/P percutaneous coronary angioplasty - CMP14+EGFR - CBC with Differential/Platelet  2. Ischemic cardiomyopathy - CMP14+EGFR - CBC with Differential/Platelet  3. NSVT (nonsustained ventricular tachycardia) (HCC) - CMP14+EGFR - CBC with Differential/Platelet  4. Pulmonary emphysema, unspecified emphysema type ([place]) - CMP14+EGFR - CBC with Differential/Platelet  5. History of CVA (cerebrovascular accident) - CMP14+EGFR -  CBC with Differential/Platelet  6. History of DVT (deep vein thrombosis) - CMP14+EGFR - CBC with Differential/Platelet  7. S/P coronary artery stent placement - CMP14+EGFR - CBC with Differential/Platelet  8. Morbid obesity ([place]) - CMP14+EGFR - CBC with Differential/Platelet  9. Major depressive disorder, recurrent severe without psychotic features ([place]) - CMP14+EGFR - CBC with Differential/Platelet  10. Factor V Leiden ([place]) - CMP14+EGFR - CBC with Differential/Platelet  11. Gastroesophageal reflux disease, unspecified whether esophagitis present - CMP14+EGFR - CBC with Differential/Platelet  12. Arthritis - CMP14+EGFR - CBC with Differential/Platelet  13. Closed trimalleolar fracture of ankle, right, initial encounter - CMP14+EGFR - CBC with Differential/Platelet  14. Dyslipidemia - CMP14+EGFR - CBC with Differential/Platelet  15. Peripheral arterial disease (HCC) - CMP14+EGFR - CBC with Differential/Platelet  16. Colon cancer screening - CMP14+EGFR - CBC with Differential/Platelet - Cologuard  17. Encounter for screening for malignant neoplasm of rectum - CMP14+EGFR - CBC with Differential/Platelet - Cologuard  18. Dysuria - urinalysis- dip and micro - CMP14+EGFR - CBC with Differential/Platelet - Urine  culture  19. Encounter for smoking cessation counseling Start nicotine patches - nicotine (NICODERM CQ - DOSED IN MG/24 HOURS) 14 mg/24hr patch; Place 1 patch (14 mg total) onto the skin daily.  Dispense: 28 patch; Refill: 0  Labs pending Health Maintenance reviewed Diet and exercise encouraged  Follow up plan: 3 months    Evelina Dun, FNP

## 2019-01-21 LAB — CBC WITH DIFFERENTIAL/PLATELET
Basophils Absolute: 0.1 10*3/uL (ref 0.0–0.2)
Basos: 2 %
EOS (ABSOLUTE): 0.3 10*3/uL (ref 0.0–0.4)
Eos: 6 %
Hematocrit: 48.5 % — ABNORMAL HIGH (ref 34.0–46.6)
Hemoglobin: 15.8 g/dL (ref 11.1–15.9)
Immature Grans (Abs): 0 10*3/uL (ref 0.0–0.1)
Immature Granulocytes: 0 %
Lymphocytes Absolute: 1.7 10*3/uL (ref 0.7–3.1)
Lymphs: 32 %
MCH: 31.3 pg (ref 26.6–33.0)
MCHC: 32.6 g/dL (ref 31.5–35.7)
MCV: 96 fL (ref 79–97)
Monocytes Absolute: 0.5 10*3/uL (ref 0.1–0.9)
Monocytes: 10 %
Neutrophils Absolute: 2.7 10*3/uL (ref 1.4–7.0)
Neutrophils: 50 %
Platelets: 255 10*3/uL (ref 150–450)
RBC: 5.05 x10E6/uL (ref 3.77–5.28)
RDW: 13 % (ref 11.7–15.4)
WBC: 5.5 10*3/uL (ref 3.4–10.8)

## 2019-01-21 LAB — CMP14+EGFR
ALT: 19 IU/L (ref 0–32)
AST: 34 IU/L (ref 0–40)
Albumin/Globulin Ratio: 1.4 (ref 1.2–2.2)
Albumin: 4.2 g/dL (ref 3.8–4.9)
Alkaline Phosphatase: 105 IU/L (ref 39–117)
BUN/Creatinine Ratio: 7 — ABNORMAL LOW (ref 9–23)
BUN: 6 mg/dL (ref 6–24)
Bilirubin Total: 0.7 mg/dL (ref 0.0–1.2)
CO2: 24 mmol/L (ref 20–29)
Calcium: 9.5 mg/dL (ref 8.7–10.2)
Chloride: 106 mmol/L (ref 96–106)
Creatinine, Ser: 0.84 mg/dL (ref 0.57–1.00)
GFR calc Af Amer: 90 mL/min/{1.73_m2} (ref >59)
GFR calc non Af Amer: 78 mL/min/{1.73_m2} (ref >59)
Globulin, Total: 3.1 g/dL (ref 1.5–4.5)
Glucose: 104 mg/dL — ABNORMAL HIGH (ref 65–99)
Potassium: 3.8 mmol/L (ref 3.5–5.2)
Sodium: 146 mmol/L — ABNORMAL HIGH (ref 134–144)
Total Protein: 7.3 g/dL (ref 6.0–8.5)

## 2019-01-23 LAB — URINE CULTURE

## 2019-01-30 ENCOUNTER — Ambulatory Visit (INDEPENDENT_AMBULATORY_CARE_PROVIDER_SITE_OTHER): Payer: Medicare HMO | Admitting: Licensed Clinical Social Worker

## 2019-01-30 DIAGNOSIS — Z9861 Coronary angioplasty status: Secondary | ICD-10-CM | POA: Diagnosis not present

## 2019-01-30 DIAGNOSIS — Z8673 Personal history of transient ischemic attack (TIA), and cerebral infarction without residual deficits: Secondary | ICD-10-CM

## 2019-01-30 DIAGNOSIS — Z86718 Personal history of other venous thrombosis and embolism: Secondary | ICD-10-CM

## 2019-01-30 DIAGNOSIS — I2510 Atherosclerotic heart disease of native coronary artery without angina pectoris: Secondary | ICD-10-CM

## 2019-01-30 DIAGNOSIS — F332 Major depressive disorder, recurrent severe without psychotic features: Secondary | ICD-10-CM

## 2019-01-30 DIAGNOSIS — K219 Gastro-esophageal reflux disease without esophagitis: Secondary | ICD-10-CM

## 2019-01-30 DIAGNOSIS — J439 Emphysema, unspecified: Secondary | ICD-10-CM | POA: Diagnosis not present

## 2019-01-30 NOTE — Chronic Care Management (AMB) (Addendum)
Care Management Note   Lindsay Bradford is a 59 y.o. year old female who is a primary care patient of Lindsay Balloon, FNP. The CM team was consulted for assistance with chronic disease management and care coordination.   I reached out to [employer] by phone today.     Review of patient status, including review of consultants reports, relevant laboratory and other test results, and collaboration with appropriate care team members and the patient's provider was performed as part of comprehensive patient evaluation and provision of chronic care management services.   Social determinants of health:risk of social isolation; risk of tobacco use; risk of food insecurity; risk of physical inactivity    Office Visit from 11/19/2018 in [facility]  PHQ-9 Total Score  18      GAD 7 : Generalized Anxiety Score 10/30/2018  Nervous, Anxious, on Edge 1  Control/stop worrying 1  Worry too much - different things 1  Trouble relaxing 1  Restless 1  Easily annoyed or irritable 0  Afraid - awful might happen 0  Total GAD 7 Score 5  Anxiety Difficulty Somewhat difficult   Medications    albuterol (VENTOLIN HFA) 108 (90 Base) MCG/ACT inhaler aspirin EC 81 MG EC tablet Calcium Carbonate-Vitamin D 600-400 MG-UNIT tablet carvedilol (COREG) 12.5 MG tablet gabapentin (NEURONTIN) 300 MG capsule haloperidol (HALDOL) 2 MG tablet nicotine (NICODERM CQ - DOSED IN MG/24 HOURS) 14 mg/24hr patch nitrofurantoin, macrocrystal-monohydrate, (MACROBID) 100 MG capsule nitroGLYCERIN (NITROSTAT) 0.4 MG SL tablet omega-3 acid ethyl esters (LOVAZA) 1 g capsule pantoprazole (PROTONIX) 40 MG tablet ranolazine (RANEXA) 500 MG 12 hr tablet rivaroxaban (XARELTO) 20 MG TABS tablet rosuvastatin (CRESTOR) 40 MG tablet tiotropium (SPIRIVA) 18 MCG inhalation capsule traZODone (DESYREL) 150 MG tablet valACYclovir (VALTREX) 500 MG tablet venlafaxine XR (EFFEXOR-XR) 75 MG 24 hr capsule Goals Addressed              This Visit's Progress    Client will talk with LCSW in next 30 days to discuss community resources of help in area (pt-stated)       Current Barriers:  Mobility challenges (uses wheelchair to ambulate) in patient with chronic diagnoses of GERD, Major Depressive Disorder, Hx DVT, Hx CVA, COPD and CAD Needs help with ADLs  Clinical Social Work Clinical Goal(s):  Client and LCSW to talk in next 30 days to discuss community resources of help to client  Interventions: Talked with Lindsay Bradford about her mobility challenges (uses wheelchair) Previously talked with Lindsay Bradford about in home support services through ADTS (LCSW provided [place] with number for [employer]TS) Talked with Lindsay Bradford about transport needs of client Talked with Lindsay Bradford about social support network (receives support from her son, Lindsay Bradford,; receives support from grandson, and receives some help from her daughter)  Talked with client about sleeping challenges of client Talked with Lindsay Bradford about relaxation techniques (listening to music, chrocheting)   Patient Self Care Activities:  Takes medications as prescribed Attends scheduled medical appointments  Plan:  LCSW to call client in next 4 weeks to talk with her about community resources of help to client Client to attends scheduled medical appointments Client to communicate with RNCM as needed to discuss nursing needs Client to participate in physical therapy sessions as scheduled   Initial Goal Documentation         Follow Up Plan: LCSW to call client in next 4 weeks to talk with client about community resources of help to client  Lindsay Bradford.Lindsay Bradford MSW, [employer] Licensed Holiday representative  [facility] Care [facility] [phone]  I have reviewed and agree with the above documentation.   Lindsay Rodney, FNP

## 2019-01-30 NOTE — Patient Instructions (Addendum)
Licensed Clinical Social Worker Visit Information  Goals we discussed today:  Goals Addressed            This Visit's Progress   . Client will talk with LCSW in next 30 days to discuss community resources of help in area (pt-stated)       Current Barriers:  [name] Mobility challenges (uses wheelchair to ambulate) in patient with chronic diagnoses of GERD, Major Depressive Disorder, Hx DVT, Hx CVA, COPD and CAD . Needs help with ADLs  Clinical Social Work Clinical Goal(s):  . Client and LCSW to talk in next 30 days to discuss community resources of help to client  Interventions:   Talked with Lynnea about her mobility challenges (uses wheelchair)  Previously talked with [name] about in home support services through ADTS (LCSW provided [place] with number for ADTS)  Talked with Farha about transport needs of client  Talked with Raseel about social support network (receives support from her son, Madelaine Bhat,; receives support from grandson, and receives some help from her daughter)  Talked with client about sleeping challenges of client  Talked with Jinelle about relaxation techniques (listening to music, chrocheting)   Patient Self Care Activities:  . Takes medications as prescribed . Attends scheduled medical appointments  Plan:  LCSW to call client in next 4 weeks to talk with her about community resources of help to client Client to attends scheduled medical appointments Client to communicate with RNCM as needed to discuss nursing needs Client to participate in physical therapy sessions as scheduled   Initial Goal Documentation         Materials Provided: No  Follow Up Plan: LCSW to call client in next 4 weeks to talk with her about community  resources of help to client at this time  The patient verbalized understanding of instructions provided today and declined a print copy of patient instruction materials.   [name].Ashya Nicolaisen MSW, LCSW Licensed Clinical Social Worker [facility] Care [facility] [phone]

## 2019-01-31 ENCOUNTER — Encounter: Payer: Self-pay | Admitting: Family Medicine

## 2019-01-31 ENCOUNTER — Ambulatory Visit (INDEPENDENT_AMBULATORY_CARE_PROVIDER_SITE_OTHER): Payer: Medicare HMO | Admitting: Family Medicine

## 2019-01-31 DIAGNOSIS — R3989 Other symptoms and signs involving the genitourinary system: Secondary | ICD-10-CM | POA: Diagnosis not present

## 2019-01-31 DIAGNOSIS — R310 Gross hematuria: Secondary | ICD-10-CM

## 2019-01-31 DIAGNOSIS — R350 Frequency of micturition: Secondary | ICD-10-CM | POA: Diagnosis not present

## 2019-01-31 LAB — URINALYSIS, COMPLETE
Bilirubin, UA: NEGATIVE
Glucose, UA: NEGATIVE
Ketones, UA: NEGATIVE
Nitrite, UA: NEGATIVE
Specific Gravity, UA: >1.03 — ABNORMAL HIGH (ref 1.005–1.030)
Urobilinogen, Ur: 0.2 mg/dL (ref 0.2–1.0)
pH, UA: 6 (ref 5.0–7.5)

## 2019-01-31 LAB — MICROSCOPIC EXAMINATION
Renal Epithel, UA: NONE SEEN /hpf
WBC, UA: >30 /hpf — AB (ref 0–5)

## 2019-01-31 MED ORDER — CIPROFLOXACIN HCL 500 MG PO TABS: 500.0000 mg | ORAL_TABLET | Freq: Two times a day (BID) | ORAL | 0 refills | Status: AC

## 2019-01-31 NOTE — Progress Notes (Signed)
Virtual Visit via Telephone Note  I connected with Lindsay Bradford on 01/31/19 at 2:04 PM by telephone and verified that I am speaking with the correct person using two identifiers. Lindsay Bradford is currently located at home and nobody is currently with her during this visit. The provider, Gwenlyn Fudge, FNP is located in their office at time of visit.  I discussed the limitations, risks, security and privacy concerns of performing an evaluation and management service by telephone and the availability of in person appointments. I also discussed with the patient that there may be a patient responsible charge related to this service. The patient expressed understanding and agreed to proceed.  Subjective: PCP: Lindsay Spencer, FNP  Chief Complaint  Patient presents with  . Urinary Tract Infection   Urinary Tract Infection: Patient complains of urinary frequency and gross hematuria She has had symptoms for 2 days. Patient does have a history of recurrent UTI - she was treated on 01/03/2019 and 11/20/2019 with macrobid 100 mg BID x7 days. Urine culture on 01/20/2019 grew > 100,000 colonies of mixed urogenital flora.  She reports she experienced gross hematuria at these times as well but never prior to that. Patient does not have a history of pyelonephritis.    ROS: Per HPI  Current Outpatient Medications:  .  albuterol (VENTOLIN HFA) 108 (90 Base) MCG/ACT inhaler, Inhale 1-2 puffs into the lungs every 6 (six) hours as needed for wheezing or shortness of breath., Disp: 8 g, Rfl: 1 .  aspirin EC 81 MG EC tablet, Take 1 tablet (81 mg total) by mouth daily., Disp: 30 tablet, Rfl: 1 .  Calcium Carbonate-Vitamin D 600-400 MG-UNIT tablet, Take by mouth., Disp: , Rfl:  .  carvedilol (COREG) 12.5 MG tablet, Take 1 tablet (12.5 mg total) by mouth 2 (two) times daily with a meal., Disp: 60 tablet, Rfl: 1 .  gabapentin (NEURONTIN) 300 MG capsule, , Disp: , Rfl:  .  haloperidol (HALDOL) 2 MG tablet, , Disp:  , Rfl:  .  nicotine (NICODERM CQ - DOSED IN MG/24 HOURS) 14 mg/24hr patch, Place 1 patch (14 mg total) onto the skin daily., Disp: 28 patch, Rfl: 0 .  nitrofurantoin, macrocrystal-monohydrate, (MACROBID) 100 MG capsule, Take 1 capsule (100 mg total) by mouth 2 (two) times daily. 1 po BId, Disp: 14 capsule, Rfl: 0 .  nitroGLYCERIN (NITROSTAT) 0.4 MG SL tablet, Place 1 tablet (0.4 mg total) under the tongue every 5 (five) minutes as needed for chest pain., Disp: 30 tablet, Rfl: 1 .  omega-3 acid ethyl esters (LOVAZA) 1 g capsule, Take 1 capsule (1 g total) by mouth 2 (two) times daily., Disp: 60 capsule, Rfl: 1 .  pantoprazole (PROTONIX) 40 MG tablet, Take 1 tablet (40 mg total) by mouth daily., Disp: 30 tablet, Rfl: 1 .  ranolazine (RANEXA) 500 MG 12 hr tablet, Take 500 mg by mouth 2 (two) times daily., Disp: , Rfl:  .  rivaroxaban (XARELTO) 20 MG TABS tablet, Take 1 tablet (20 mg total) by mouth daily., Disp: 30 tablet, Rfl: 1 .  rosuvastatin (CRESTOR) 40 MG tablet, Take 1 tablet (40 mg total) by mouth daily., Disp: 30 tablet, Rfl: 1 .  tiotropium (SPIRIVA) 18 MCG inhalation capsule, Place 1 capsule (18 mcg total) into inhaler and inhale daily., Disp: 30 capsule, Rfl: 1 .  traZODone (DESYREL) 150 MG tablet, Take by mouth at bedtime., Disp: , Rfl:  .  valACYclovir (VALTREX) 500 MG tablet, Take 1 tablet (500 mg total)  by mouth daily., Disp: 30 tablet, Rfl: 1 .  venlafaxine XR (EFFEXOR-XR) 75 MG 24 hr capsule, Take 1 capsule (75 mg total) by mouth daily with breakfast., Disp: 90 capsule, Rfl: 3  Allergies  Allergen Reactions  . Cephalexin Hives  . Clarithromycin Hives  . Ibuprofen Hives  . Other Itching and Other (See Comments)    Whole milk and oranges  . Oxymorphone Palpitations  . Penicillins Anaphylaxis    Did it involve swelling of the face/tongue/throat, SOB, or low BP? Yes Did it involve sudden or severe rash/hives, skin peeling, or any reaction on the inside of your mouth or nose? Yes  Did you need to seek medical attention at a hospital or doctor's office? Yes When did it last happen?childhood allergy If all above answers are "NO", may proceed with cephalosporin use.   [name] Potassium Chloride Hives    Reacts to oral KCl, tolerates it slow IV   . Sulfa Antibiotics Anaphylaxis  . Clindamycin Hives and Itching  . Fentanyl Hives and Other (See Comments)    Fentanyl patch, it breaks pt out in hives and lowers her blood pressure.   . Naproxen Hives  . Iodinated Diagnostic Agents Hives  . Lyrica [Pregabalin]     Chest pain  . Tetracycline Hives and Itching   Past Medical History:  Diagnosis Date  . Arthritis   . Asthma   . Bursitis of left hip   . COPD (chronic obstructive pulmonary disease) ([place])   . Coronary artery disease   . Depression   . DVT (deep venous thrombosis) (HCC)    left  . Factor V Leiden ([place])   . Fibromyalgia   . GERD (gastroesophageal reflux disease)   . Headache    sesaonal  . Herpes simplex type 2 infection   . History of blood transfusion   . Hyperlipidemia   . Hypotension    90's, Normally; in pain normal; BP  . Ischemic cardiomyopathy   . MI (myocardial infarction) ([place])    x 2 2000   . Pneumonia   . Stroke ([place]) 02/27/2018   no residual    Observations/Objective: A&O  No respiratory distress or wheezing audible over the phone Mood, judgement, and thought processes all WNL  Assessment and Plan: 1. Suspected UTI - Education provided on UTIs. Encouraged adequate hydration. Asked that patient come leave a urine sample about a week after completing antibiotics to ensure UTI has resolved and there is not more blood in her urine. Discussed if she continues to have blood in her urine a referral will be placed to a urologist.  - ciprofloxacin (CIPRO) 500 MG tablet; Take 1 tablet (500 mg total) by mouth 2 (two) times daily for 10 days.  Dispense: 20 tablet; Refill: 0  2-3 Gross hematuria/Urinary frequency - Urine culture -  urinalysis- dip and micro   Follow Up Instructions:  I discussed the assessment and treatment plan with the patient. The patient was provided an opportunity to ask questions and all were answered. The patient agreed with the plan and demonstrated an understanding of the instructions.   The patient was advised to call back or seek an in-person evaluation if the symptoms worsen or if the condition fails to improve as anticipated.  The above assessment and management plan was discussed with the patient. The patient verbalized understanding of and has agreed to the management plan. Patient is aware to call the clinic if symptoms persist or worsen. Patient is aware when to return to the  clinic for a follow-up visit. Patient educated on when it is appropriate to go to the emergency department.   Time call ended: 2:20  I provided 19 minutes of non-face-to-face time during this encounter.  Deliah Boston, MSN, APRN, FNP-C [facility] 01/31/19

## 2019-01-31 NOTE — Patient Instructions (Signed)
Urinary Tract Infection, Adult A urinary tract infection (UTI) is an infection of any part of the urinary tract. The urinary tract includes:  The kidneys.  The ureters.  The bladder.  The urethra. These organs make, store, and get rid of pee (urine) in the body. What are the causes? This is caused by germs (bacteria) in your genital area. These germs grow and cause swelling (inflammation) of your urinary tract. What increases the risk? You are more likely to develop this condition if:  You have a small, thin tube (catheter) to drain pee.  You cannot control when you pee or poop (incontinence).  You are female, and: ? You use these methods to prevent pregnancy:  A medicine that kills sperm (spermicide).  A device that blocks sperm (diaphragm). ? You have low levels of a female hormone (estrogen). ? You are pregnant.  You have genes that add to your risk.  You are sexually active.  You take antibiotic medicines.  You have trouble peeing because of: ? A prostate that is bigger than normal, if you are female. ? A blockage in the part of your body that drains pee from the bladder (urethra). ? A kidney stone. ? A nerve condition that affects your bladder (neurogenic bladder). ? Not getting enough to drink. ? Not peeing often enough.  You have other conditions, such as: ? Diabetes. ? A weak disease-fighting system (immune system). ? Sickle cell disease. ? Gout. ? Injury of the spine. What are the signs or symptoms? Symptoms of this condition include:  Needing to pee right away (urgently).  Peeing often.  Peeing small amounts often.  Pain or burning when peeing.  Blood in the pee.  Pee that smells bad or not like normal.  Trouble peeing.  Pee that is cloudy.  Fluid coming from the vagina, if you are female.  Pain in the belly or lower back. Other symptoms include:  Throwing up (vomiting).  No urge to eat.  Feeling mixed up (confused).  Being tired  and grouchy (irritable).  A fever.  Watery poop (diarrhea). How is this treated? This condition may be treated with:  Antibiotic medicine.  Other medicines.  Drinking enough water. Follow these instructions at home:  Medicines  Take over-the-counter and prescription medicines only as told by your doctor.  If you were prescribed an antibiotic medicine, take it as told by your doctor. Do not stop taking it even if you start to feel better. General instructions  Make sure you: ? Pee until your bladder is empty. ? Do not hold pee for a long time. ? Empty your bladder after sex. ? Wipe from front to back after pooping if you are a female. Use each tissue one time when you wipe.  Drink enough fluid to keep your pee pale yellow.  Keep all follow-up visits as told by your doctor. This is important. Contact a doctor if:  You do not get better after 1-2 days.  Your symptoms go away and then come back. Get help right away if:  You have very bad back pain.  You have very bad pain in your lower belly.  You have a fever.  You are sick to your stomach (nauseous).  You are throwing up. Summary  A urinary tract infection (UTI) is an infection of any part of the urinary tract.  This condition is caused by germs in your genital area.  There are many risk factors for a UTI. These include having a small, thin   tube to drain pee and not being able to control when you pee or poop.  Treatment includes antibiotic medicines for germs.  Drink enough fluid to keep your pee pale yellow. This information is not intended to replace advice given to you by your health care provider. Make sure you discuss any questions you have with your health care provider. Document Revised: 12/20/2017 Document Reviewed: 07/12/2017 Elsevier Patient Education  2020 [employer]  

## 2019-02-02 LAB — URINE CULTURE

## 2019-02-03 ENCOUNTER — Other Ambulatory Visit: Payer: Self-pay

## 2019-02-03 ENCOUNTER — Telehealth: Payer: Self-pay | Admitting: Family

## 2019-02-03 DIAGNOSIS — I493 Ventricular premature depolarization: Secondary | ICD-10-CM | POA: Diagnosis not present

## 2019-02-03 DIAGNOSIS — Z4509 Encounter for adjustment and management of other cardiac device: Secondary | ICD-10-CM | POA: Diagnosis not present

## 2019-02-04 ENCOUNTER — Other Ambulatory Visit: Payer: Self-pay

## 2019-02-04 ENCOUNTER — Ambulatory Visit: Payer: Medicare HMO | Admitting: Family

## 2019-02-05 ENCOUNTER — Encounter: Payer: Self-pay | Admitting: Family

## 2019-02-05 ENCOUNTER — Encounter: Payer: Self-pay | Admitting: Family Medicine

## 2019-02-05 ENCOUNTER — Other Ambulatory Visit: Payer: Self-pay | Admitting: Family Medicine

## 2019-02-05 ENCOUNTER — Ambulatory Visit (INDEPENDENT_AMBULATORY_CARE_PROVIDER_SITE_OTHER): Payer: Medicare HMO | Admitting: Family Medicine

## 2019-02-05 VITALS — BP 123/85 | HR 81 | Temp 97.3°F | Ht 67.0 in | Wt 204.6 lb

## 2019-02-05 DIAGNOSIS — M5431 Sciatica, right side: Secondary | ICD-10-CM

## 2019-02-05 DIAGNOSIS — M5432 Sciatica, left side: Secondary | ICD-10-CM | POA: Diagnosis not present

## 2019-02-05 DIAGNOSIS — R310 Gross hematuria: Secondary | ICD-10-CM

## 2019-02-05 MED ORDER — METHYLPREDNISOLONE ACETATE 80 MG/ML IJ SUSP
80.0000 mg | Freq: Once | INTRAMUSCULAR | Status: AC
  Administered 2019-02-05: 80 mg via INTRAMUSCULAR

## 2019-02-05 NOTE — Progress Notes (Signed)
BP 123/85   Pulse 81   Temp (!) 97.3 F (36.3 C) (Temporal)   Ht 5\' 7"  (1.702 m)   Wt 204 lb 9.6 oz (92.8 kg)   SpO2 92%   BMI 32.04 kg/m    Subjective:   Patient ID: Lindsay Bradford, female    DOB: [birth year], 59 y.o.   MRN: [id number]  HPI: Lindsay Bradford is a 59 y.o. female presenting on 02/05/2019 for Hip Pain (bilateral- States that it has been going on for a few weeks and now making her back hurt.)   HPI Patient is coming in with complaints of hip pain and lower back pain bilaterally in the back that is been hurting over the past few weeks.  She has had this issue previously and had some x-rays last year for her hips.  She says it hurts on both sides when she lays on them it also hurts in her lower back and into her spine in the middle one that lower aspect of her back.  She has had some pain chronically but says that is been worse over the past 3 weeks.  She denies any numbness or weakness or falling or giving way.  She rates the pain as high  Relevant past medical, surgical, family and social history reviewed and updated as indicated. Interim medical history since our last visit reviewed. Allergies and medications reviewed and updated.  Review of Systems  Constitutional: Negative for chills and fever.  Respiratory: Negative for chest tightness and shortness of breath.   Cardiovascular: Negative for chest pain and leg swelling.  Musculoskeletal: Positive for arthralgias and back pain. Negative for gait problem.  Skin: Negative for rash.  Neurological: Negative for light-headedness and headaches.  Psychiatric/Behavioral: Negative for agitation and behavioral problems.  All other systems reviewed and are negative.   Per HPI unless specifically indicated above   Allergies as of 02/05/2019      Reactions   Cephalexin Hives   Clarithromycin Hives   Ibuprofen Hives   Other Itching, Other (See Comments)   Whole milk and oranges   Oxymorphone Palpitations   Penicillins  Anaphylaxis   Did it involve swelling of the face/tongue/throat, SOB, or low BP? Yes Did it involve sudden or severe rash/hives, skin peeling, or any reaction on the inside of your mouth or nose? Yes Did you need to seek medical attention at a hospital or doctor's office? Yes When did it last happen?childhood allergy If all above answers are "NO", may proceed with cephalosporin use.   Potassium Chloride Hives   Reacts to oral KCl, tolerates it slow IV   Sulfa Antibiotics Anaphylaxis   Clindamycin Hives, Itching   Fentanyl Hives, Other (See Comments)   Fentanyl patch, it breaks pt out in hives and lowers her blood pressure.   Naproxen Hives   Iodinated Diagnostic Agents Hives   Lyrica [pregabalin]    Chest pain   Tetracycline Hives, Itching      Medication List       Accurate as of February 05, 2019  8:51 AM. If you have any questions, ask your nurse or doctor.        albuterol 108 (90 Base) MCG/ACT inhaler Commonly known as: VENTOLIN HFA Inhale 1-2 puffs into the lungs every 6 (six) hours as needed for wheezing or shortness of breath.   aspirin 81 MG EC tablet Take 1 tablet (81 mg total) by mouth daily.   Calcium Carbonate-Vitamin D 600-400 MG-UNIT tablet Take by mouth.   carvedilol  12.5 MG tablet Commonly known as: COREG Take 1 tablet (12.5 mg total) by mouth 2 (two) times daily with a meal.   ciprofloxacin 500 MG tablet Commonly known as: Cipro Take 1 tablet (500 mg total) by mouth 2 (two) times daily for 10 days.   gabapentin 300 MG capsule Commonly known as: NEURONTIN   haloperidol 2 MG tablet Commonly known as: HALDOL   nicotine 14 mg/24hr patch Commonly known as: NICODERM CQ - dosed in mg/24 hours Place 1 patch (14 mg total) onto the skin daily.   nitroGLYCERIN 0.4 MG SL tablet Commonly known as: NITROSTAT Place 1 tablet (0.4 mg total) under the tongue every 5 (five) minutes as needed for chest pain.   omega-3 acid ethyl esters 1 g capsule  Commonly known as: LOVAZA Take 1 capsule (1 g total) by mouth 2 (two) times daily.   pantoprazole 40 MG tablet Commonly known as: PROTONIX Take 1 tablet (40 mg total) by mouth daily.   ranolazine 500 MG 12 hr tablet Commonly known as: RANEXA Take 500 mg by mouth 2 (two) times daily.   rivaroxaban 20 MG Tabs tablet Commonly known as: Xarelto Take 1 tablet (20 mg total) by mouth daily.   rosuvastatin 40 MG tablet Commonly known as: Crestor Take 1 tablet (40 mg total) by mouth daily.   tiotropium 18 MCG inhalation capsule Commonly known as: SPIRIVA Place 1 capsule (18 mcg total) into inhaler and inhale daily.   traZODone 150 MG tablet Commonly known as: DESYREL Take by mouth at bedtime.   valACYclovir 500 MG tablet Commonly known as: VALTREX Take 1 tablet (500 mg total) by mouth daily.   venlafaxine XR 75 MG 24 hr capsule Commonly known as: EFFEXOR-XR Take 1 capsule (75 mg total) by mouth daily with breakfast.        Objective:   BP 123/85   Pulse 81   Temp (!) 97.3 F (36.3 C) (Temporal)   Ht 5\' 7"  (1.702 m)   Wt 204 lb 9.6 oz (92.8 kg)   SpO2 92%   BMI 32.04 kg/m   Wt Readings from Last 3 Encounters:  02/05/19 204 lb 9.6 oz (92.8 kg)  01/20/19 207 lb (93.9 kg)  12/10/18 230 lb (104.3 kg)    Physical Exam Vitals and nursing note reviewed.  Constitutional:      General: She is not in acute distress.    Appearance: She is well-developed. She is not diaphoretic.  Eyes:     Conjunctiva/sclera: Conjunctivae normal.  Musculoskeletal:        General: Normal range of motion.     Cervical back: Tenderness present. No deformity, spasms or bony tenderness. Pain with movement (Pain with flexion and extension of the spine, negative straight leg raise bilaterally) present.       Back:  Skin:    General: Skin is warm and dry.     Findings: No rash.  Neurological:     Mental Status: She is alert and oriented to person, place, and time.     Coordination:  Coordination normal.  Psychiatric:        Behavior: Behavior normal.       Assessment & Plan:   Problem List Items Addressed This Visit    None    Visit Diagnoses    Sciatic nerve pain, left    -  Primary   Relevant Medications   methylPREDNISolone acetate (DEPO-MEDROL) injection 80 mg (Start on 02/05/2019  9:00 AM)   Sciatic nerve pain, right  Relevant Medications   methylPREDNISolone acetate (DEPO-MEDROL) injection 80 mg (Start on 02/05/2019  9:00 AM)      Likely related to sciatic nerve pain and trochanteric bursitis explaining most of her pain that she is having on both sides, will do Depo-Medrol injection and watch for improvement, follow-up with PCP if not improved Follow up plan: Return if symptoms worsen or fail to improve.  Counseling provided for all of the vaccine components No orders of the defined types were placed in this encounter.   Arville Care, MD [facility] 02/05/2019, 8:51 AM

## 2019-02-10 ENCOUNTER — Telehealth: Payer: Self-pay | Admitting: Family

## 2019-02-11 MED ORDER — VALACYCLOVIR HCL 500 MG PO TABS: 500.0000 mg | ORAL_TABLET | Freq: Every day | ORAL | 1 refills | Status: DC

## 2019-02-11 NOTE — Telephone Encounter (Signed)
Pt states she takes this daily for oral HSV prevention, it had been filled by her old providers office prior to her coming here. Ok to refill?

## 2019-02-11 NOTE — Telephone Encounter (Signed)
Prescription sent to pharmacy.

## 2019-02-12 DIAGNOSIS — Z1212 Encounter for screening for malignant neoplasm of rectum: Secondary | ICD-10-CM | POA: Diagnosis not present

## 2019-02-12 DIAGNOSIS — Z1211 Encounter for screening for malignant neoplasm of colon: Secondary | ICD-10-CM | POA: Diagnosis not present

## 2019-02-17 ENCOUNTER — Encounter: Payer: Self-pay | Admitting: Family

## 2019-02-17 ENCOUNTER — Other Ambulatory Visit: Payer: Self-pay

## 2019-02-17 ENCOUNTER — Ambulatory Visit (INDEPENDENT_AMBULATORY_CARE_PROVIDER_SITE_OTHER): Payer: Medicare HMO | Admitting: Family

## 2019-02-17 VITALS — BP 111/75 | HR 76 | Temp 96.8°F | Ht 67.0 in | Wt 204.6 lb

## 2019-02-17 DIAGNOSIS — K219 Gastro-esophageal reflux disease without esophagitis: Secondary | ICD-10-CM

## 2019-02-17 DIAGNOSIS — I2510 Atherosclerotic heart disease of native coronary artery without angina pectoris: Secondary | ICD-10-CM | POA: Diagnosis not present

## 2019-02-17 DIAGNOSIS — Z86718 Personal history of other venous thrombosis and embolism: Secondary | ICD-10-CM | POA: Diagnosis not present

## 2019-02-17 DIAGNOSIS — D6851 Activated protein C resistance: Secondary | ICD-10-CM

## 2019-02-17 DIAGNOSIS — J439 Emphysema, unspecified: Secondary | ICD-10-CM

## 2019-02-17 DIAGNOSIS — E785 Hyperlipidemia, unspecified: Secondary | ICD-10-CM | POA: Diagnosis not present

## 2019-02-17 DIAGNOSIS — M1990 Unspecified osteoarthritis, unspecified site: Secondary | ICD-10-CM

## 2019-02-17 DIAGNOSIS — Z955 Presence of coronary angioplasty implant and graft: Secondary | ICD-10-CM

## 2019-02-17 DIAGNOSIS — G4700 Insomnia, unspecified: Secondary | ICD-10-CM

## 2019-02-17 DIAGNOSIS — F332 Major depressive disorder, recurrent severe without psychotic features: Secondary | ICD-10-CM | POA: Diagnosis not present

## 2019-02-17 DIAGNOSIS — Z8673 Personal history of transient ischemic attack (TIA), and cerebral infarction without residual deficits: Secondary | ICD-10-CM | POA: Diagnosis not present

## 2019-02-17 NOTE — Patient Instructions (Signed)
Health Maintenance, Female Adopting a healthy lifestyle and getting preventive care are important in promoting health and wellness. Ask your health care provider about:  The right schedule for you to have regular tests and exams.  Things you can do on your own to prevent diseases and keep yourself healthy. What should I know about diet, weight, and exercise? Eat a healthy diet   Eat a diet that includes plenty of vegetables, fruits, low-fat dairy products, and lean protein.  Do not eat a lot of foods that are high in solid fats, added sugars, or sodium. Maintain a healthy weight Body mass index (BMI) is used to identify weight problems. It estimates body fat based on height and weight. Your health care provider can help determine your BMI and help you achieve or maintain a healthy weight. Get regular exercise Get regular exercise. This is one of the most important things you can do for your health. Most adults should:  Exercise for at least 150 minutes each week. The exercise should increase your heart rate and make you sweat (moderate-intensity exercise).  Do strengthening exercises at least twice a week. This is in addition to the moderate-intensity exercise.  Spend less time sitting. Even light physical activity can be beneficial. Watch cholesterol and blood lipids Have your blood tested for lipids and cholesterol at 57 years of age, then have this test every 5 years. Have your cholesterol levels checked more often if:  Your lipid or cholesterol levels are high.  You are older than 57 years of age.  You are at high risk for heart disease. What should I know about cancer screening? Depending on your health history and family history, you may need to have cancer screening at various ages. This may include screening for:  Breast cancer.  Cervical cancer.  Colorectal cancer.  Skin cancer.  Lung cancer. What should I know about heart disease, diabetes, and high blood  pressure? Blood pressure and heart disease  High blood pressure causes heart disease and increases the risk of stroke. This is more likely to develop in people who have high blood pressure readings, are of African descent, or are overweight.  Have your blood pressure checked: ? Every 3-5 years if you are 18-39 years of age. ? Every year if you are 40 years old or older. Diabetes Have regular diabetes screenings. This checks your fasting blood sugar level. Have the screening done:  Once every three years after age 40 if you are at a normal weight and have a low risk for diabetes.  More often and at a younger age if you are overweight or have a high risk for diabetes. What should I know about preventing infection? Hepatitis B If you have a higher risk for hepatitis B, you should be screened for this virus. Talk with your health care provider to find out if you are at risk for hepatitis B infection. Hepatitis C Testing is recommended for:  Everyone born from 1945 through 1965.  Anyone with known risk factors for hepatitis C. Sexually transmitted infections (STIs)  Get screened for STIs, including gonorrhea and chlamydia, if: ? You are sexually active and are younger than 57 years of age. ? You are older than 57 years of age and your health care provider tells you that you are at risk for this type of infection. ? Your sexual activity has changed since you were last screened, and you are at increased risk for chlamydia or gonorrhea. Ask your health care provider if   you are at risk.  Ask your health care provider about whether you are at high risk for HIV. Your health care provider may recommend a prescription medicine to help prevent HIV infection. If you choose to take medicine to prevent HIV, you should first get tested for HIV. You should then be tested every 3 months for as long as you are taking the medicine. Pregnancy  If you are about to stop having your period (premenopausal) and  you may become pregnant, seek counseling before you get pregnant.  Take 400 to 800 micrograms (mcg) of folic acid every day if you become pregnant.  Ask for birth control (contraception) if you want to prevent pregnancy. Osteoporosis and menopause Osteoporosis is a disease in which the bones lose minerals and strength with aging. This can result in bone fractures. If you are 65 years old or older, or if you are at risk for osteoporosis and fractures, ask your health care provider if you should:  Be screened for bone loss.  Take a calcium or vitamin D supplement to lower your risk of fractures.  Be given hormone replacement therapy (HRT) to treat symptoms of menopause. Follow these instructions at home: Lifestyle  Do not use any products that contain nicotine or tobacco, such as cigarettes, e-cigarettes, and chewing tobacco. If you need help quitting, ask your health care provider.  Do not use street drugs.  Do not share needles.  Ask your health care provider for help if you need support or information about quitting drugs. Alcohol use  Do not drink alcohol if: ? Your health care provider tells you not to drink. ? You are pregnant, may be pregnant, or are planning to become pregnant.  If you drink alcohol: ? Limit how much you use to 0-1 drink a day. ? Limit intake if you are breastfeeding.  Be aware of how much alcohol is in your drink. In the [place] one drink equals one 12 oz bottle of beer (355 mL), one 5 oz glass of wine (148 mL), or one 1 oz glass of hard liquor (44 mL). General instructions  Schedule regular health, dental, and eye exams.  Stay current with your vaccines.  Tell your health care provider if: ? You often feel depressed. ? You have ever been abused or do not feel safe at home. Summary  Adopting a healthy lifestyle and getting preventive care are important in promoting health and wellness.  Follow your health care provider's instructions about healthy  diet, exercising, and getting tested or screened for diseases.  Follow your health care provider's instructions on monitoring your cholesterol and blood pressure. This information is not intended to replace advice given to you by your health care provider. Make sure you discuss any questions you have with your health care provider. Document Revised: 12/26/2017 Document Reviewed: 12/26/2017 Elsevier Patient Education  2020 Elsevier Inc.  

## 2019-02-17 NOTE — Progress Notes (Signed)
Subjective:    Patient ID: Lindsay Bradford, female    DOB: 08-24-1960, 59 y.o.   MRN: [id number]  No chief complaint on file.  Pt presents to the office today for chronic follow up.  Pt is followed by Cardiologists annually for hx MI in 2000 , Hx CVA, PVD, isand NSVT. She does take xarelto daily because she has hx of DVT with Leiden.   She is waiting to scheduled her behavorial health in the next few weeks.   She is scheduled to see Urologists for hematuria in March.   Gastroesophageal Reflux She complains of belching, heartburn and a hoarse voice. This is a chronic problem. The current episode started more than 1 year ago. The problem occurs occasionally. The problem has been waxing and waning. The heartburn is of moderate intensity. The symptoms are aggravated by certain foods. She has tried a PPI for the symptoms. The treatment provided moderate relief.  Nicotine Dependence Presents for follow-up visit. Symptoms include insomnia. Her urge triggers include company of smokers. The symptoms have been stable. She smokes 1 pack of cigarettes per day.  Arthritis Presents for follow-up visit. She complains of pain. The symptoms have been stable. Affected locations include the neck, right ankle and left ankle. Her pain is at a severity of 5/10.  Depression        This is a chronic problem.  The current episode started more than 1 year ago.   The onset quality is gradual.   The problem occurs intermittently.  Associated symptoms include insomnia, irritable, decreased interest and sad.  Associated symptoms include no helplessness, no hopelessness and no headaches.     The symptoms are aggravated by family issues.  Past treatments include SNRIs - Serotonin and norepinephrine reuptake inhibitors. Hyperlipidemia This is a chronic problem. The current episode started more than 1 year ago. Pertinent negatives include no shortness of breath. Current antihyperlipidemic treatment includes statins. The current  treatment provides moderate improvement of lipids. Risk factors for coronary artery disease include dyslipidemia, hypertension, post-menopausal and a sedentary lifestyle.  Insomnia Primary symptoms: difficulty falling asleep, frequent awakening.  The current episode started more than one year. The onset quality is gradual. PMH includes: depression.      Review of Systems  Constitutional: Negative.   HENT: Positive for hoarse voice.   Eyes: Negative.   Respiratory: Negative.  Negative for shortness of breath.   Cardiovascular: Negative.  Negative for palpitations.  Gastrointestinal: Positive for heartburn.  Endocrine: Negative.   Genitourinary: Negative.   Musculoskeletal: Positive for arthritis.  Neurological: Negative.  Negative for headaches.  Hematological: Negative.   Psychiatric/Behavioral: Positive for depression. The patient has insomnia.   All other systems reviewed and are negative.      Objective:   Physical Exam Vitals reviewed.  Constitutional:      General: She is irritable. She is not in acute distress.    Appearance: She is well-developed.  HENT:     Head: Normocephalic and atraumatic.     Right Ear: Tympanic membrane normal.     Left Ear: Tympanic membrane normal.  Eyes:     Pupils: Pupils are equal, round, and reactive to light.  Neck:     Thyroid: No thyromegaly.  Cardiovascular:     Rate and Rhythm: Normal rate and regular rhythm.     Heart sounds: Normal heart sounds. No murmur.  Pulmonary:     Effort: Pulmonary effort is normal. No respiratory distress.     Breath sounds: Normal  breath sounds. No wheezing.  Abdominal:     General: Bowel sounds are normal. There is no distension.     Palpations: Abdomen is soft.     Tenderness: There is no abdominal tenderness.  Musculoskeletal:        General: No tenderness. Normal range of motion.     Cervical back: Normal range of motion and neck supple.  Skin:    General: Skin is warm and dry.    Neurological:     Mental Status: She is alert and oriented to person, place, and time.     Cranial Nerves: No cranial nerve deficit.     Deep Tendon Reflexes: Reflexes are normal and symmetric.  Psychiatric:        Behavior: Behavior normal.        Thought Content: Thought content normal.        Judgment: Judgment normal.      BP 111/75   Pulse 76   Temp (!) 96.8 F (36 C) (Temporal)   Ht 5\' 7"  (1.702 m)   Wt 204 lb 9.6 oz (92.8 kg)   SpO2 91%   BMI 32.04 kg/m       Assessment & Plan:  Cleopatra Sardo comes in today with chief complaint of Medical Management of Chronic Issues   Diagnosis and orders addressed:  1. Coronary artery disease involving native coronary artery of native heart without angina pectoris  2. Pulmonary emphysema, unspecified emphysema type (HCC)  3. Gastroesophageal reflux disease, unspecified whether esophagitis present  4. Arthritis  5. Factor V Leiden (HCC)  6. Dyslipidemia  7. History of CVA (cerebrovascular accident)  8. History of DVT (deep vein thrombosis)  9. Major depressive disorder, recurrent severe without psychotic features (HCC)  10. Morbid obesity (HCC)  11. S/P coronary artery stent placement  12. Insomnia, unspecified type   Labs reviewed  Health Maintenance reviewed Diet and exercise encouraged  Follow up plan: 3 months    [name], FNP

## 2019-02-18 LAB — COLOGUARD: Cologuard: NEGATIVE

## 2019-02-19 DIAGNOSIS — S82851A Displaced trimalleolar fracture of right lower leg, initial encounter for closed fracture: Secondary | ICD-10-CM | POA: Diagnosis not present

## 2019-02-24 ENCOUNTER — Ambulatory Visit (INDEPENDENT_AMBULATORY_CARE_PROVIDER_SITE_OTHER): Payer: Medicare HMO | Admitting: Licensed Clinical Social Worker

## 2019-02-24 ENCOUNTER — Telehealth: Payer: Self-pay | Admitting: Family

## 2019-02-24 DIAGNOSIS — I2510 Atherosclerotic heart disease of native coronary artery without angina pectoris: Secondary | ICD-10-CM

## 2019-02-24 DIAGNOSIS — J439 Emphysema, unspecified: Secondary | ICD-10-CM | POA: Diagnosis not present

## 2019-02-24 DIAGNOSIS — Z86718 Personal history of other venous thrombosis and embolism: Secondary | ICD-10-CM

## 2019-02-24 DIAGNOSIS — F332 Major depressive disorder, recurrent severe without psychotic features: Secondary | ICD-10-CM

## 2019-02-24 DIAGNOSIS — K219 Gastro-esophageal reflux disease without esophagitis: Secondary | ICD-10-CM

## 2019-02-24 DIAGNOSIS — Z8673 Personal history of transient ischemic attack (TIA), and cerebral infarction without residual deficits: Secondary | ICD-10-CM

## 2019-02-24 NOTE — Telephone Encounter (Signed)
Patient advised to let us know about a week before her temporary handicap expires and we can re-do the form and change it to permanent.

## 2019-02-24 NOTE — Telephone Encounter (Signed)
Pt called to see what she needs to do so that she can get a permanent handicap sticker. Says her temporary one expires in April.

## 2019-02-24 NOTE — Chronic Care Management (AMB) (Addendum)
Care Management Note   Lindsay Bradford is a 59 y.o. year old female who is a primary care patient of Sharion Balloon, FNP. The CM team was consulted for assistance with chronic disease management and care coordination.   I reached out to [employer] by phone today.   Review of patient status, including review of consultants reports, relevant laboratory and other test results, and collaboration with appropriate care team members and the patient's provider was performed as part of comprehensive patient evaluation and provision of chronic care management services.   Social determinants of health: risk of social isolation; risk of tobacco use; risk of financial strain; risk of depression; risk of food insecurity; risk of physical inactivity    Office Visit from 02/17/2019 in [facility]  PHQ-9 Total Score  15      GAD 7 : Generalized Anxiety Score 02/17/2019 10/30/2018  Nervous, Anxious, on Edge 0 1  Control/stop worrying 0 1  Worry too much - different things 0 1  Trouble relaxing 1 1  Restless 0 1  Easily annoyed or irritable 2 0  Afraid - awful might happen 2 0  Total GAD 7 Score 5 5  Anxiety Difficulty Not difficult at all Somewhat difficult   Medications    (very important)  New medications from outside sources are available for reconciliation   albuterol (VENTOLIN HFA) 108 (90 Base) MCG/ACT inhaler aspirin EC 81 MG EC tablet Calcium Carbonate-Vitamin D 600-400 MG-UNIT tablet carvedilol (COREG) 12.5 MG tablet gabapentin (NEURONTIN) 300 MG capsule haloperidol (HALDOL) 2 MG tablet nicotine (NICODERM CQ - DOSED IN MG/24 HOURS) 14 mg/24hr patch nitroGLYCERIN (NITROSTAT) 0.4 MG SL tablet omega-3 acid ethyl esters (LOVAZA) 1 g capsule pantoprazole (PROTONIX) 40 MG tablet ranolazine (RANEXA) 500 MG 12 hr tablet rivaroxaban (XARELTO) 20 MG TABS tablet rosuvastatin (CRESTOR) 40 MG tablet tiotropium (SPIRIVA) 18 MCG inhalation capsule traZODone (DESYREL) 150 MG  tablet valACYclovir (VALTREX) 500 MG tablet venlafaxine XR (EFFEXOR-XR) 75 MG 24 hr capsule  Goals      Client will talk with LCSW in next 30 days to discuss community resources of help in area (pt-stated)     Current Barriers:  Mobility challenges (uses wheelchair to ambulate) in patient with chronic diagnoses of GERD, Major Depressive Disorder, Hx DVT, Hx CVA, COPD and CAD Needs help with ADLs  Clinical Social Work Clinical Goal(s):  Client and LCSW to talk in next 30 days to discuss community resources of help to client  Interventions: Talked with Lindsay Bradford about her mobility challenges (uses wheelchair) Previously talked with Lindsay Bradford about in home support services through [employer] (LCSW provided [place] with number for [employer]) Talked with Lindsay Bradford about transport needs of client Talked with Lindsay Bradford about social support network (receives support from her son, Lindsay Bradford,; receives support from grandson, and receives some help from her daughter)  Talked with client about sleeping challenges of client Talked with Lindsay Bradford about relaxation techniques (listening to music, chrocheting)  Talked with client about financial needs of client   Patient Self Care Activities:  Takes medications as prescribed Attends scheduled medical appointments  Plan:  LCSW to call client in next 4 weeks to talk with her about community resources of help to client Client to attends scheduled medical appointments Client to communicate with RNCM as needed to discuss nursing needs Client to participate in physical therapy sessions as scheduled  Initial Goal Documentation         Follow Up Plan: LCSW to call client in the next 4  weeks to talk with her about community resources of help to client at that time.  [name].Tayvia Faughnan MSW, LCSW Licensed Clinical Social Worker [facility] [phone]  I have reviewed and agree with the above  documentation.   Jannifer Rodney, FNP

## 2019-02-24 NOTE — Patient Instructions (Addendum)
Licensed Clinical Social Worker Visit Information  Goals we discussed today:  Goals    . Client will talk with LCSW in next 30 days to discuss community resources of help in area (pt-stated)     Current Barriers:  [name] Mobility challenges (uses wheelchair to ambulate) in patient with chronic diagnoses of GERD, Major Depressive Disorder, Hx DVT, Hx CVA, COPD and CAD . Needs help with ADLs  Clinical Social Work Clinical Goal(s):  . Client and LCSW to talk in next 30 days to discuss community resources of help to client  Interventions:   Talked with Zylie about her mobility challenges (uses wheelchair)  Previously talked with [name] about in home support services through ADTS (LCSW provided [place] with number for ADTS)  Talked with Patton about transport needs of client  Talked with Marynell about social support network (receives support from her son, Madelaine Bhat,; receives support from grandson, and receives some help from her daughter)  Talked with client about sleeping challenges of client  Talked with Kamerin about relaxation techniques (listening to music, chrocheting)  Talked with client about financial needs of client  Patient Self Care Activities:  . Takes medications as prescribed . Attends scheduled medical appointments  Plan:  LCSW to call client in next 4 weeks to talk with her about community resources of help to client Client to attends scheduled medical appointments Client to communicate with RNCM as needed to discuss nursing needs Client to participate in physical therapy sessions as scheduled  Initial Goal Documentation       Materials Provided: No  Follow Up Plan: LCSW to call client in next 4 weeks to talk with her about community resources of help to client  The patient verbalized understanding of instructions provided today and declined a print copy of patient instruction materials.   [name].Trenda Corliss MSW, LCSW Licensed Clinical Social Worker [facility] [phone]

## 2019-02-26 ENCOUNTER — Ambulatory Visit: Payer: Medicare HMO | Admitting: Family

## 2019-02-28 DIAGNOSIS — G8911 Acute pain due to trauma: Secondary | ICD-10-CM | POA: Diagnosis not present

## 2019-02-28 DIAGNOSIS — Z882 Allergy status to sulfonamides status: Secondary | ICD-10-CM | POA: Diagnosis not present

## 2019-02-28 DIAGNOSIS — Z880 Allergy status to penicillin: Secondary | ICD-10-CM | POA: Diagnosis not present

## 2019-02-28 DIAGNOSIS — S9031XA Contusion of right foot, initial encounter: Secondary | ICD-10-CM | POA: Diagnosis not present

## 2019-02-28 DIAGNOSIS — Z7982 Long term (current) use of aspirin: Secondary | ICD-10-CM | POA: Diagnosis not present

## 2019-02-28 DIAGNOSIS — Z79899 Other long term (current) drug therapy: Secondary | ICD-10-CM | POA: Diagnosis not present

## 2019-02-28 DIAGNOSIS — S92341A Displaced fracture of fourth metatarsal bone, right foot, initial encounter for closed fracture: Secondary | ICD-10-CM | POA: Diagnosis not present

## 2019-02-28 DIAGNOSIS — Z888 Allergy status to other drugs, medicaments and biological substances status: Secondary | ICD-10-CM | POA: Diagnosis not present

## 2019-02-28 DIAGNOSIS — Z881 Allergy status to other antibiotic agents status: Secondary | ICD-10-CM | POA: Diagnosis not present

## 2019-02-28 DIAGNOSIS — Z885 Allergy status to narcotic agent status: Secondary | ICD-10-CM | POA: Diagnosis not present

## 2019-02-28 DIAGNOSIS — Z043 Encounter for examination and observation following other accident: Secondary | ICD-10-CM | POA: Diagnosis not present

## 2019-02-28 DIAGNOSIS — Z7901 Long term (current) use of anticoagulants: Secondary | ICD-10-CM | POA: Diagnosis not present

## 2019-03-01 DIAGNOSIS — Z043 Encounter for examination and observation following other accident: Secondary | ICD-10-CM | POA: Diagnosis not present

## 2019-03-04 DIAGNOSIS — S82851D Displaced trimalleolar fracture of right lower leg, subsequent encounter for closed fracture with routine healing: Secondary | ICD-10-CM | POA: Diagnosis not present

## 2019-03-04 DIAGNOSIS — S92331A Displaced fracture of third metatarsal bone, right foot, initial encounter for closed fracture: Secondary | ICD-10-CM | POA: Diagnosis not present

## 2019-03-04 DIAGNOSIS — S92341A Displaced fracture of fourth metatarsal bone, right foot, initial encounter for closed fracture: Secondary | ICD-10-CM | POA: Diagnosis not present

## 2019-03-10 DIAGNOSIS — R55 Syncope and collapse: Secondary | ICD-10-CM | POA: Diagnosis not present

## 2019-03-19 DIAGNOSIS — S82851A Displaced trimalleolar fracture of right lower leg, initial encounter for closed fracture: Secondary | ICD-10-CM | POA: Diagnosis not present

## 2019-03-21 ENCOUNTER — Ambulatory Visit: Payer: Medicare HMO | Admitting: Licensed Clinical Social Worker

## 2019-03-21 DIAGNOSIS — I2510 Atherosclerotic heart disease of native coronary artery without angina pectoris: Secondary | ICD-10-CM

## 2019-03-21 DIAGNOSIS — J439 Emphysema, unspecified: Secondary | ICD-10-CM

## 2019-03-21 DIAGNOSIS — F332 Major depressive disorder, recurrent severe without psychotic features: Secondary | ICD-10-CM

## 2019-03-21 DIAGNOSIS — K219 Gastro-esophageal reflux disease without esophagitis: Secondary | ICD-10-CM

## 2019-03-21 DIAGNOSIS — Z86718 Personal history of other venous thrombosis and embolism: Secondary | ICD-10-CM

## 2019-03-21 DIAGNOSIS — Z8673 Personal history of transient ischemic attack (TIA), and cerebral infarction without residual deficits: Secondary | ICD-10-CM

## 2019-03-21 NOTE — Chronic Care Management (AMB) (Addendum)
Care Management Note   Lindsay Bradford is a 59 y.o. year old female who is a primary care patient of [name], FNP. The CM team was consulted for assistance with chronic disease management and care coordination.   I reached out to [employer] by phone today.     Review of patient status, including review of consultants reports, relevant laboratory and other test results, and collaboration with appropriate care team members and the patient's provider was performed as part of comprehensive patient evaluation and provision of chronic care management services.   Social determinants of health:risk of social isolation; risk of tobacco use; risk of financial strain; risk of depression; risk of physical inactivity; risk of food insecurity    Office Visit from 02/17/2019 in [facility]  PHQ-9 Total Score  15      GAD 7 : Generalized Anxiety Score 02/17/2019 10/30/2018  Nervous, Anxious, on Edge 0 1  Control/stop worrying 0 1  Worry too much - different things 0 1  Trouble relaxing 1 1  Restless 0 1  Easily annoyed or irritable 2 0  Afraid - awful might happen 2 0  Total GAD 7 Score 5 5  Anxiety Difficulty Not difficult at all Somewhat difficult   Medications    (very important)  New medications from outside sources are available for reconciliation   albuterol (VENTOLIN HFA) 108 (90 Base) MCG/ACT inhaler aspirin EC 81 MG EC tablet Calcium Carbonate-Vitamin D 600-400 MG-UNIT tablet carvedilol (COREG) 12.5 MG tablet gabapentin (NEURONTIN) 300 MG capsule haloperidol (HALDOL) 2 MG tablet nicotine (NICODERM CQ - DOSED IN MG/24 HOURS) 14 mg/24hr patch nitroGLYCERIN (NITROSTAT) 0.4 MG SL tablet omega-3 acid ethyl esters (LOVAZA) 1 g capsule pantoprazole (PROTONIX) 40 MG tablet ranolazine (RANEXA) 500 MG 12 hr tablet rivaroxaban (XARELTO) 20 MG TABS tablet rosuvastatin (CRESTOR) 40 MG tablet tiotropium (SPIRIVA) 18 MCG inhalation capsule traZODone (DESYREL) 150  MG tablet valACYclovir (VALTREX) 500 MG tablet venlafaxine XR (EFFEXOR-XR) 75 MG 24 hr capsule  Goals          Client will talk with LCSW in next 30 days to discuss community resources of help in area (pt-stated)     Current Barriers:  Mobility challenges (uses wheelchair to ambulate) in patient with chronic diagnoses of GERD, Major Depressive Disorder, Hx DVT, Hx CVA, COPD and CAD Needs help with ADLs  Clinical Social Work Clinical Goal(s):  Client and LCSW to talk in next 30 days to discuss community resources of help to client  Interventions:  Previously talked with [name] about her mobility challenges (uses wheelchair) Previously talked with [name] about in home support services through ADTS (LCSW provided [place] with number for ADTS) Talked with Kimbly previously  about transport needs of client Talked with Tamisha previously about social support network (receives support from her son, Madelaine Bhat,; receives support from grandson, and receives some help from her daughter)  Previously talked with client about sleeping challenges of client Previously talked with [name] about relaxation techniques (listening to music, chrocheting)  Previously talked with client about financial needs of client   Patient Self Care Activities:  Takes medications as prescribed Attends scheduled medical appointments  Plan:  LCSW to call client in next 4 weeks to talk with her about community resources of help to client Client to attends scheduled medical appointments Client to communicate with RNCM as needed to discuss nursing needs Client to participate in physical therapy sessions as scheduled  Initial Goal Documentation  Follow Up Plan: LCSW to call client in next 4 weeks to talk with her about community resources of help to client  Norva Riffle.Austine Wiedeman MSW, LCSW Licensed Clinical Social Worker [facility] [phone]  I have reviewed and agree with the above  documentation.   Evelina Dun, FNP

## 2019-03-21 NOTE — Patient Instructions (Addendum)
Licensed Clinical Social Worker Visit Information  Goals we discussed today:  Goals    . AWV     09/27/2018 AWV Goal: Exercise for General Health   Patient will verbalize understanding of the benefits of increased physical activity:  Exercising regularly is important. It will improve your overall fitness, flexibility, and endurance.  Regular exercise also will improve your overall health. It can help you control your weight, reduce stress, and improve your bone density.  Over the next year, patient will increase physical activity as tolerated with a goal of at least 150 minutes of moderate physical activity per week.   You can tell that you are exercising at a moderate intensity if your heart starts beating faster and you start breathing faster but can still hold a conversation.  Moderate-intensity exercise ideas include:  Walking 1 mile (1.6 km) in about 15 minutes  Biking  Hiking  Golfing  Dancing  Water aerobics  Patient will verbalize understanding of everyday activities that increase physical activity by providing examples like the following: ? Yard work, such as: ? Pushing a Surveyor, mining ? Raking and bagging leaves ? Washing your car ? Pushing a stroller ? Shoveling snow ? Gardening ? Washing windows or floors  Patient will be able to explain general safety guidelines for exercising:   Before you start a new exercise program, talk with your health care provider.  Do not exercise so much that you hurt yourself, feel dizzy, or get very short of breath.  Wear comfortable clothes and wear shoes with good support.  Drink plenty of water while you exercise to prevent dehydration or heat stroke.  Work out until your breathing and your heartbeat get faster.     . Client will talk with LCSW in next 30 days to discuss community resources of help in area (pt-stated)     Current Barriers:  [name] Mobility challenges (uses wheelchair to ambulate) in patient with chronic  diagnoses of GERD, Major Depressive Disorder, Hx DVT, Hx CVA, COPD and CAD . Needs help with ADLs  Clinical Social Work Clinical Goal(s):  . Client and LCSW to talk in next 30 days to discuss community resources of help to client  Interventions:  Previously talked with [name] about her mobility challenges (uses wheelchair)  Previously talked with [name] about in home support services through ADTS (LCSW provided [place] with number for ADTS)  Talked with Jahnessa previously about transport needs of client  Talked with Kenijah previously about social support network (receives support from her son, Madelaine Bhat,; receives support from grandson, and receives some help from her daughter)  Previously talked with client about sleeping challenges of client  Previously talked with [name] about relaxation techniques (listening to music, chrocheting)  Previously talked with client about financial needs of client  Patient Self Care Activities:  . Takes medications as prescribed . Attends scheduled medical appointments  Plan:  LCSW to call client in next 4 weeks to talk with her about community resources of help to client Client to attends scheduled medical appointments Client to communicate with RNCM as needed to discuss nursing needs Client to participate in physical therapy sessions as scheduled .   Initial Goal Documentation      Materials Provided:  No  Follow Up Plan: LCSW to call client in next 4 weeks to talk with client about community resources of help to client  The patient verbalized understanding of instructions provided today and declined a print copy of patient instruction materials.   Casimiro Needle  S.Shandi Godfrey MSW, LCSW Licensed Clinical Social Worker [facility] [phone]

## 2019-03-30 ENCOUNTER — Other Ambulatory Visit: Payer: Self-pay | Admitting: Family

## 2019-03-30 ENCOUNTER — Other Ambulatory Visit: Payer: Self-pay | Admitting: Psychiatry

## 2019-04-01 ENCOUNTER — Ambulatory Visit: Payer: Medicare HMO | Admitting: Urology

## 2019-04-06 ENCOUNTER — Other Ambulatory Visit: Payer: Self-pay | Admitting: Psychiatry

## 2019-04-11 ENCOUNTER — Ambulatory Visit (INDEPENDENT_AMBULATORY_CARE_PROVIDER_SITE_OTHER): Payer: Medicare HMO | Admitting: Licensed Clinical Social Worker

## 2019-04-11 DIAGNOSIS — Z8673 Personal history of transient ischemic attack (TIA), and cerebral infarction without residual deficits: Secondary | ICD-10-CM

## 2019-04-11 DIAGNOSIS — K219 Gastro-esophageal reflux disease without esophagitis: Secondary | ICD-10-CM

## 2019-04-11 DIAGNOSIS — J439 Emphysema, unspecified: Secondary | ICD-10-CM | POA: Diagnosis not present

## 2019-04-11 DIAGNOSIS — Z86718 Personal history of other venous thrombosis and embolism: Secondary | ICD-10-CM

## 2019-04-11 DIAGNOSIS — F332 Major depressive disorder, recurrent severe without psychotic features: Secondary | ICD-10-CM | POA: Diagnosis not present

## 2019-04-11 DIAGNOSIS — I2510 Atherosclerotic heart disease of native coronary artery without angina pectoris: Secondary | ICD-10-CM

## 2019-04-11 NOTE — Patient Instructions (Addendum)
Licensed Clinical Social Worker Visit Information  Goals we discussed today:  Goals        . Client will talk with LCSW in next 30 days to discuss community resources of help in area (pt-stated)     Current Barriers:  [name] Mobility challenges (uses wheelchair to ambulate) in patient with chronic diagnoses of GERD, Major Depressive Disorder, Hx DVT, Hx CVA, COPD and CAD . Needs help with ADLs  Clinical Social Work Clinical Goal(s):  . Client and LCSW to talk in next 30 days to discuss community resources of help to client  Interventions:   Previously talked with [name] about her mobility challenges (uses wheelchair)  Previously talked with [name] about in home support services through ADTS (LCSW provided [place] with number for ADTS)  Talked with Donapreviouslyabout transport needs of client  Talked with Donapreviously about social support network (receives support from her son, Madelaine Bhat,; receives support from grandson, and receives some help from her daughter)  Previously talked with client about sleeping challenges of client  Previously talked with [name] about relaxation techniques (listening to music, chrocheting) . Previously talked with client about financial needs of client   Patient Self Care Activities:  . Takes medications as prescribed . Attends scheduled medical appointments  Plan:  LCSW to call client in next 4 weeks to talk with her about community resources of help to client Client to attends scheduled medical appointments Client to communicate with RNCM as needed to discuss nursing needs Client to participate in physical therapy sessions as scheduled  Initial Goal Documentation     Materials Provided: No  Follow Up Plan: LCSW to call client in next 4 weeks to talk with her about community resources of help to client  The patient verbalized understanding of instructions provided today and declined a print copy of patient instruction materials.   [name].Domingo Fuson  MSW, LCSW Licensed Clinical Social Worker [facility] [phone]

## 2019-04-11 NOTE — Chronic Care Management (AMB) (Addendum)
Care Management Note   Lindsay Bradford is a 59 y.o. year old female who is a primary care patient of [name], FNP. The CM team was consulted for assistance with chronic disease management and care coordination.   I reached out to [employer] by phone today.   Review of patient status, including review of consultants reports, relevant laboratory and other test results, and collaboration with appropriate care team members and the patient's provider was performed as part of comprehensive patient evaluation and provision of chronic care management services.   Social determinants of health: risk of social isolation; risk of tobacco use; risk of food needs. Risk of depression; risk of physical inactivity    Office Visit from 02/17/2019 in [facility]  PHQ-9 Total Score  15      GAD 7 : Generalized Anxiety Score 02/17/2019 10/30/2018  Nervous, Anxious, on Edge 0 1  Control/stop worrying 0 1  Worry too much - different things 0 1  Trouble relaxing 1 1  Restless 0 1  Easily annoyed or irritable 2 0  Afraid - awful might happen 2 0  Total GAD 7 Score 5 5  Anxiety Difficulty Not difficult at all Somewhat difficult  Medications    (very important)  New medications from outside sources are available for reconciliation   albuterol (VENTOLIN HFA) 108 (90 Base) MCG/ACT inhaler aspirin EC 81 MG EC tablet Calcium Carbonate-Vitamin D 600-400 MG-UNIT tablet carvedilol (COREG) 12.5 MG tablet gabapentin (NEURONTIN) 300 MG capsule haloperidol (HALDOL) 2 MG tablet nicotine (NICODERM CQ - DOSED IN MG/24 HOURS) 14 mg/24hr patch nitroGLYCERIN (NITROSTAT) 0.4 MG SL tablet omega-3 acid ethyl esters (LOVAZA) 1 g capsule pantoprazole (PROTONIX) 40 MG tablet ranolazine (RANEXA) 500 MG 12 hr tablet rivaroxaban (XARELTO) 20 MG TABS tablet rosuvastatin (CRESTOR) 40 MG tablet tiotropium (SPIRIVA) 18 MCG inhalation capsule traZODone (DESYREL) 150 MG tablet valACYclovir (VALTREX)  500 MG tablet venlafaxine XR (EFFEXOR-XR) 75 MG 24 hr capsule  Goals          Client will talk with LCSW in next 30 days to discuss community resources of help in area (pt-stated)     Current Barriers:  Mobility challenges (uses wheelchair to ambulate) in patient with chronic diagnoses of GERD, Major Depressive Disorder, Hx DVT, Hx CVA, COPD and CAD Needs help with ADLs  Clinical Social Work Clinical Goal(s):  Client and LCSW to talk in next 30 days to discuss community resources of help to client  Interventions: Previously talked with [name] about her mobility challenges (uses wheelchair) Previously talked with [name] about in home support services through ADTS (LCSW provided [place] with number for ADTS) Talked with Jazz previously  about transport needs of client Talked with Maleigh previously about social support network (receives support from her son, Madelaine Bhat,; receives support from grandson, and receives some help from her daughter)  Previously talked with client about sleeping challenges of client Previously talked with [name] about relaxation techniques (listening to music, chrocheting)  Previously talked with client about financial needs of client   Patient Self Care Activities:  Takes medications as prescribed Attends scheduled medical appointments  Plan:  LCSW to call client in next 4 weeks to talk with her about community resources of help to client Client to attends scheduled medical appointments Client to communicate with RNCM as needed to discuss nursing needs Client to participate in physical therapy sessions as scheduled  Initial Goal Documentation         Follow Up Plan: LCSW to  call client in next 4 weeks to talk with client about community resources of help to client  Norva Riffle.Kailea Dannemiller MSW, LCSW Licensed Clinical Social Worker [facility] [phone]   I have reviewed and agree with the above  documentation.   Evelina Dun, FNP

## 2019-04-12 ENCOUNTER — Other Ambulatory Visit: Payer: Self-pay | Admitting: Family Medicine

## 2019-04-14 NOTE — Telephone Encounter (Signed)
[facility] to address on tuesday

## 2019-04-14 NOTE — Telephone Encounter (Signed)
[facility] patient Last office visit 02/17/2019 Last refill 12/10/2018, #360, 1 refill

## 2019-04-15 ENCOUNTER — Other Ambulatory Visit: Payer: Self-pay | Admitting: Family Medicine

## 2019-04-15 NOTE — Telephone Encounter (Signed)
Last office visit 02/17/2019 Last refill 12/16/2018, #30, 5 refills

## 2019-04-22 DIAGNOSIS — S92331D Displaced fracture of third metatarsal bone, right foot, subsequent encounter for fracture with routine healing: Secondary | ICD-10-CM | POA: Diagnosis not present

## 2019-04-22 DIAGNOSIS — S82851D Displaced trimalleolar fracture of right lower leg, subsequent encounter for closed fracture with routine healing: Secondary | ICD-10-CM | POA: Diagnosis not present

## 2019-04-22 DIAGNOSIS — S92341D Displaced fracture of fourth metatarsal bone, right foot, subsequent encounter for fracture with routine healing: Secondary | ICD-10-CM | POA: Diagnosis not present

## 2019-04-23 ENCOUNTER — Encounter: Payer: Self-pay | Admitting: Urology

## 2019-04-23 ENCOUNTER — Ambulatory Visit (INDEPENDENT_AMBULATORY_CARE_PROVIDER_SITE_OTHER): Payer: Medicare HMO | Admitting: Urology

## 2019-04-23 ENCOUNTER — Other Ambulatory Visit: Payer: Self-pay

## 2019-04-23 ENCOUNTER — Other Ambulatory Visit (HOSPITAL_COMMUNITY)
Admission: RE | Admit: 2019-04-23 | Discharge: 2019-04-23 | Disposition: A | Discharge status: Home / Self Care | Payer: Medicare HMO | Source: Ambulatory Visit | Attending: Urology | Admitting: Urology

## 2019-04-23 VITALS — BP 93/61 | HR 76 | Temp 97.2°F | Ht 68.0 in | Wt 201.0 lb

## 2019-04-23 DIAGNOSIS — R310 Gross hematuria: Secondary | ICD-10-CM | POA: Diagnosis not present

## 2019-04-23 DIAGNOSIS — R3129 Other microscopic hematuria: Secondary | ICD-10-CM | POA: Diagnosis not present

## 2019-04-23 DIAGNOSIS — R319 Hematuria, unspecified: Secondary | ICD-10-CM | POA: Diagnosis not present

## 2019-04-23 LAB — POCT URINALYSIS DIPSTICK
Bilirubin, UA: NEGATIVE
Glucose, UA: NEGATIVE
Ketones, UA: NEGATIVE
Nitrite, UA: NEGATIVE
Protein, UA: POSITIVE — AB
Spec Grav, UA: 1.015 (ref 1.010–1.025)
Urobilinogen, UA: 0.2 E.U./dL
pH, UA: 6 (ref 5.0–8.0)

## 2019-04-23 NOTE — Patient Instructions (Signed)
Hematuria, Adult Hematuria is blood in the urine. Blood may be visible in the urine, or it may be identified with a test. This condition can be caused by infections of the bladder, urethra, kidney, or prostate. Other possible causes include:  Kidney stones.  Cancer of the urinary tract.  Too much calcium in the urine.  Conditions that are passed from parent to child (inherited conditions).  Exercise that requires a lot of energy. Infections can usually be treated with medicine, and a kidney stone usually will pass through your urine. If neither of these is the cause of your hematuria, more tests may be needed to identify the cause of your symptoms. It is very important to tell your health care provider about any blood in your urine, even if it is painless or the blood stops without treatment. Blood in the urine, when it happens and then stops and then happens again, can be a symptom of a very serious condition, including cancer. There is no pain in the initial stages of many urinary cancers. Follow these instructions at home: Medicines  Take over-the-counter and prescription medicines only as told by your health care provider.  If you were prescribed an antibiotic medicine, take it as told by your health care provider. Do not stop taking the antibiotic even if you start to feel better. Eating and drinking  Drink enough fluid to keep your urine clear or pale yellow. It is recommended that you drink 3-4 quarts (2.8-3.8 L) a day. If you have been diagnosed with an infection, it is recommended that you drink cranberry juice in addition to large amounts of water.  Avoid caffeine, tea, and carbonated beverages. These tend to irritate the bladder.  Avoid alcohol because it may irritate the prostate (men). General instructions  If you have been diagnosed with a kidney stone, follow your health care provider's instructions about straining your urine to catch the stone.  Empty your bladder  often. Avoid holding urine for long periods of time.  If you are female: ? After a bowel movement, wipe from front to back and use each piece of toilet paper only once. ? Empty your bladder before and after sex.  Pay attention to any changes in your symptoms. Tell your health care provider about any changes or any new symptoms.  It is your responsibility to get your test results. Ask your health care provider, or the department performing the test, when your results will be ready.  Keep all follow-up visits as told by your health care provider. This is important. Contact a health care provider if:  You develop back pain.  You have a fever.  You have nausea or vomiting.  Your symptoms do not improve after 3 days.  Your symptoms get worse. Get help right away if:  You develop severe vomiting and are unable take medicine without vomiting.  You develop severe pain in your back or abdomen even though you are taking medicine.  You pass a large amount of blood in your urine.  You pass blood clots in your urine.  You feel very weak or like you might faint.  You faint. Summary  Hematuria is blood in the urine. It has many possible causes.  It is very important that you tell your health care provider about any blood in your urine, even if it is painless or the blood stops without treatment.  Take over-the-counter and prescription medicines only as told by your health care provider.  Drink enough fluid to keep   your urine clear or pale yellow. This information is not intended to replace advice given to you by your health care provider. Make sure you discuss any questions you have with your health care provider. Document Revised: 05/29/2018 Document Reviewed: 02/05/2016 Elsevier Patient Education  2020 [employer]  

## 2019-04-23 NOTE — Progress Notes (Signed)
04/23/2019 11:18 AM   Lindsay Bradford Mar 27, 1960 [id number]  Referring provider: [name], FNP [address] [place],  [place] [address]  Gross hematuria  HPI: Lindsay Bradford is a 58yo seen for evaluation of gross hematuria. Hematuria started in January 2021. No hx of nephrolithiasis. She has a hx of cystocele repair in 2000. unknown mesh. She has a 30pk year tobacco hx. NO other LUTS. No frequency/urgency.   She is on xarelto for CAD    PMH: Past Medical History:  Diagnosis Date  . Arthritis   . Asthma   . Bursitis of left hip   . COPD (chronic obstructive pulmonary disease) (HCC)   . Coronary artery disease   . Depression   . DVT (deep venous thrombosis) (HCC)    left  . Factor V Leiden (HCC)   . Fibromyalgia   . GERD (gastroesophageal reflux disease)   . Headache    sesaonal  . Herpes simplex type 2 infection   . History of blood transfusion   . Hyperlipidemia   . Hypotension    90's, Normally; in pain normal; BP  . Ischemic cardiomyopathy   . MI (myocardial infarction) (HCC)    x 2 2000   . Pneumonia   . Stroke [facility]) 02/27/2018   no residual    Surgical History: Past Surgical History:  Procedure Laterality Date  . ABDOMINAL HYSTERECTOMY    . APPENDECTOMY    . CARDIAC CATHETERIZATION    . COLONOSCOPY    . FOOT SURGERY Bilateral   . LOOP RECORDER IMPLANT  06/20/2018   [facility]   . ORIF ANKLE FRACTURE Right 10/18/2018   Procedure: OPEN REDUCTION INTERNAL FIXATION (ORIF) ANKLE FRACTURE;  Surgeon: Lindsay Lofts, MD;  Location: [facility] OR;  Service: Orthopedics;  Laterality: Right;  . SHOULDER SURGERY Right   . VEIN BYPASS SURGERY Left     Home Medications:  Allergies as of 04/23/2019      Reactions   Cephalexin Hives   Clarithromycin Hives   Ibuprofen Hives   Other Itching, Other (See Comments)   Whole milk and oranges   Oxymorphone Palpitations   Penicillins Anaphylaxis   Did it involve swelling of the face/tongue/throat, SOB, or low BP? Yes Did  it involve sudden or severe rash/hives, skin peeling, or any reaction on the inside of your mouth or nose? Yes Did you need to seek medical attention at a hospital or doctor's office? Yes When did it last happen?childhood allergy If all above answers are "NO", may proceed with cephalosporin use.   Potassium Chloride Hives   Reacts to oral KCl, tolerates it slow IV   Sulfa Antibiotics Anaphylaxis   Clindamycin Hives, Itching   Fentanyl Hives, Other (See Comments)   Fentanyl patch, it breaks pt out in hives and lowers her blood pressure.   Naproxen Hives   Iodinated Diagnostic Agents Hives   Lyrica [pregabalin]    Chest pain   Tetracycline Hives, Itching      Medication List       Accurate as of April 23, 2019 11:18 AM. If you have any questions, ask your nurse or doctor.        STOP taking these medications   traZODone 150 MG tablet Commonly known as: DESYREL Stopped by: Lindsay Aye, MD     TAKE these medications   albuterol 108 (90 Base) MCG/ACT inhaler Commonly known as: VENTOLIN HFA Inhale 1-2 puffs into the lungs every 6 (six) hours as needed for wheezing or shortness  of breath.   aspirin 81 MG EC tablet Take 1 tablet (81 mg total) by mouth daily.   Calcium Carbonate-Vitamin D 600-400 MG-UNIT tablet Take by mouth.   carvedilol 12.5 MG tablet Commonly known as: COREG Take 1 tablet (12.5 mg total) by mouth 2 (two) times daily with a meal.   carvedilol 6.25 MG tablet Commonly known as: COREG   gabapentin 300 MG capsule Commonly known as: NEURONTIN   haloperidol 2 MG tablet Commonly known as: HALDOL   nicotine 14 mg/24hr patch Commonly known as: NICODERM CQ - dosed in mg/24 hours Place 1 patch (14 mg total) onto the skin daily.   nitroGLYCERIN 0.4 MG SL tablet Commonly known as: NITROSTAT Place 1 tablet (0.4 mg total) under the tongue every 5 (five) minutes as needed for chest pain.   omega-3 acid ethyl esters 1 g capsule Commonly known as:  LOVAZA Take 1 capsule (1 g total) by mouth 2 (two) times daily.   pantoprazole 40 MG tablet Commonly known as: PROTONIX Take 1 tablet (40 mg total) by mouth daily.   ranolazine 500 MG 12 hr tablet Commonly known as: RANEXA Take 500 mg by mouth 2 (two) times daily.   rivaroxaban 20 MG Tabs tablet Commonly known as: Xarelto Take 1 tablet (20 mg total) by mouth daily.   rosuvastatin 40 MG tablet Commonly known as: Crestor Take 1 tablet (40 mg total) by mouth daily.   tiotropium 18 MCG inhalation capsule Commonly known as: SPIRIVA Place 1 capsule (18 mcg total) into inhaler and inhale daily.   topiramate 100 MG tablet Commonly known as: TOPAMAX Take 100 mg by mouth daily.   valACYclovir 500 MG tablet Commonly known as: VALTREX Take 1 tablet (500 mg total) by mouth daily.   venlafaxine XR 75 MG 24 hr capsule Commonly known as: EFFEXOR-XR Take 1 capsule (75 mg total) by mouth daily with breakfast. What changed: Another medication with the same name was removed. Continue taking this medication, and follow the directions you see here. Changed by: Lindsay Aye, MD       Allergies:  Allergies  Allergen Reactions  . Cephalexin Hives  . Clarithromycin Hives  . Ibuprofen Hives  . Other Itching and Other (See Comments)    Whole milk and oranges  . Oxymorphone Palpitations  . Penicillins Anaphylaxis    Did it involve swelling of the face/tongue/throat, SOB, or low BP? Yes Did it involve sudden or severe rash/hives, skin peeling, or any reaction on the inside of your mouth or nose? Yes Did you need to seek medical attention at a hospital or doctor's office? Yes When did it last happen?childhood allergy If all above answers are "NO", may proceed with cephalosporin use.   [name] Potassium Chloride Hives    Reacts to oral KCl, tolerates it slow IV   . Sulfa Antibiotics Anaphylaxis  . Clindamycin Hives and Itching  . Fentanyl Hives and Other (See Comments)    Fentanyl  patch, it breaks pt out in hives and lowers her blood pressure.   . Naproxen Hives  . Iodinated Diagnostic Agents Hives  . Lyrica [Pregabalin]     Chest pain  . Tetracycline Hives and Itching    Family History: Family History  Problem Relation Age of Onset  . Arthritis Mother   . Cancer Mother   . Colon cancer Mother   . Breast cancer Mother   . COPD Father   . COPD Brother   . Heart disease Sister   . COPD  Brother   . Stroke Brother   . Heart disease Brother   . Clotting disorder Brother   . Colon cancer Maternal Grandmother   . Cancer Maternal Grandmother   . Breast cancer Maternal Grandmother   . Cancer Paternal Grandmother   . Colon cancer Paternal Grandmother   . Cancer Nephew   . Colon cancer Nephew     Social History:  reports that she has been smoking cigarettes. She has a 17.50 pack-year smoking history. She has never used smokeless tobacco. She reports previous alcohol use. She reports previous drug use.  ROS: All other review of systems were reviewed and are negative except what is noted above in HPI  Physical Exam: BP 93/61   Pulse 76   Temp (!) 97.2 F (36.2 C)   Ht 5\' 8"  (1.727 m)   Wt 201 lb (91.2 kg)   BMI 30.56 kg/m   Constitutional:  Alert and oriented, No acute distress. HEENT: [place] AT, moist mucus membranes.  Trachea midline, no masses. Cardiovascular: No clubbing, cyanosis, or edema. Respiratory: Normal respiratory effort, no increased work of breathing. GI: Abdomen is soft, nontender, nondistended, no abdominal masses GU: No CVA tenderness.  Lymph: No cervical or inguinal lymphadenopathy. Skin: No rashes, bruises or suspicious lesions. Neurologic: Grossly intact, no focal deficits, moving all 4 extremities. Psychiatric: Normal mood and affect.  Laboratory Data: Lab Results  Component Value Date   WBC 5.5 01/20/2019   HGB 15.8 01/20/2019   HCT 48.5 (H) 01/20/2019   MCV 96 01/20/2019   PLT 255 01/20/2019    Lab Results  Component  Value Date   CREATININE 0.84 01/20/2019    No results found for: PSA  No results found for: TESTOSTERONE  No results found for: HGBA1C  Urinalysis    Component Value Date/Time   APPEARANCEUR Cloudy (A) 01/31/2019 1430   GLUCOSEU Negative 01/31/2019 1430   BILIRUBINUR Negative 01/31/2019 1430   PROTEINUR 2+ (A) 01/31/2019 1430   NITRITE Negative 01/31/2019 1430   LEUKOCYTESUR 1+ (A) 01/31/2019 1430    Lab Results  Component Value Date   LABMICR See below: 01/31/2019   WBCUA >30 (A) 01/31/2019   LABEPIT 0-10 01/31/2019   BACTERIA Many (A) 01/31/2019    Pertinent Imaging: none No results found for this or any previous visit. Results for orders placed during the hospital encounter of 06/19/17  US Venous Img Lower Bilateral   Narrative CLINICAL DATA:  Bilateral lower extremity edema  EXAM: BILATERAL LOWER EXTREMITY VENOUS DOPPLER ULTRASOUND  TECHNIQUE: Gray-scale sonography with graded compression, as well as color Doppler and duplex ultrasound were performed to evaluate the lower extremity deep venous systems from the level of the common femoral vein and including the common femoral, femoral, profunda femoral, popliteal and calf veins including the posterior tibial, peroneal and gastrocnemius veins when visible. The superficial great saphenous vein was also interrogated. Spectral Doppler was utilized to evaluate flow at rest and with distal augmentation maneuvers in the common femoral, femoral and popliteal veins.  COMPARISON:  None.  FINDINGS: RIGHT LOWER EXTREMITY  Common Femoral Vein: No evidence of thrombus. Normal compressibility, respiratory phasicity and response to augmentation.  Saphenofemoral Junction: No evidence of thrombus. Normal compressibility and flow on color Doppler imaging.  Profunda Femoral Vein: No evidence of thrombus. Normal compressibility and flow on color Doppler imaging.  Femoral Vein: No evidence of thrombus. Normal  compressibility, respiratory phasicity and response to augmentation.  Popliteal Vein: No evidence of thrombus. Normal compressibility, respiratory phasicity and response  to augmentation.  Calf Veins: Posterior tibial vein appears patent. Limited assessment the peroneal vein which is not visualized.  Superficial Great Saphenous Vein: No evidence of thrombus. Normal compressibility.  Venous Reflux:  None.  Other Findings:  Peripheral edema noted.  LEFT LOWER EXTREMITY  Common Femoral Vein: No evidence of thrombus. Normal compressibility, respiratory phasicity and response to augmentation.  Saphenofemoral Junction: No evidence of thrombus. Normal compressibility and flow on color Doppler imaging.  Profunda Femoral Vein: No evidence of thrombus. Normal compressibility and flow on color Doppler imaging.  Femoral Vein: No evidence of thrombus. Normal compressibility, respiratory phasicity and response to augmentation.  Popliteal Vein: No evidence of thrombus. Normal compressibility, respiratory phasicity and response to augmentation.  Calf Veins: Posterior tibial vein appears patent. Peroneal vein not visualized. Limited assessment.  Superficial Great Saphenous Vein: No evidence of thrombus. Normal compressibility.  Venous Reflux:  None.  Other Findings: Peripheral edema noted. Left distal thigh surgical scar demonstrates superficial subcutaneous nonspecific linear hypoechoic area measuring 3.6 cm in length and 0.7 cm and diameter. This may represent chronic sub surface hematoma, seroma, less likely abscess. Correlate with overlying exam.  IMPRESSION: Negative for significant DVT in either extremity.  Peripheral edema noted  Left distal medial thigh nonspecific hypoechoic area deep to overlying surgical scar remains nonspecific. See above comment.   Electronically Signed   By: Judie Petit.  Shick M.D.   On: 06/19/2017 15:41    No results found for this or any previous  visit. No results found for this or any previous visit. No results found for this or any previous visit. No results found for this or any previous visit. No results found for this or any previous visit. No results found for this or any previous visit.  Assessment & Plan:    1. Microscopic hematuria -hematuria workup - POCT urinalysis dipstick  2. Gross hematuria -BMP -CT hematuria -office cystoscopy -urine cystology   No follow-ups on file.  Lindsay Aye, MD  [facility] [place]

## 2019-04-23 NOTE — Progress Notes (Signed)
Urological Symptom Review  Patient is experiencing the following symptoms: Hard to postpone urination Get up at night to urinate Leakage of urine   Review of Systems  Gastrointestinal (upper)  : Indigestion/heartburn  Gastrointestinal (lower) : Diarrhea  Constitutional : Negative for symptoms  Skin: Negative for skin symptoms  Eyes: Negative for eye symptoms  Ear/Nose/Throat : Negative for Ear/Nose/Throat symptoms  Hematologic/Lymphatic: Easy bruising  Cardiovascular : Negative for cardiovascular symptoms  Respiratory : Cough Shortness of breath  Endocrine: Negative for endocrine symptoms  Musculoskeletal: Back pain  Neurological: Dizziness  Psychologic: Depression Anxiety

## 2019-04-27 LAB — CYTOLOGY - NON PAP

## 2019-05-05 DIAGNOSIS — Z4509 Encounter for adjustment and management of other cardiac device: Secondary | ICD-10-CM | POA: Diagnosis not present

## 2019-05-07 DIAGNOSIS — M2021 Hallux rigidus, right foot: Secondary | ICD-10-CM | POA: Diagnosis not present

## 2019-05-08 DIAGNOSIS — R55 Syncope and collapse: Secondary | ICD-10-CM | POA: Diagnosis not present

## 2019-05-13 ENCOUNTER — Ambulatory Visit: Payer: Medicare HMO | Admitting: Licensed Clinical Social Worker

## 2019-05-13 DIAGNOSIS — Z86718 Personal history of other venous thrombosis and embolism: Secondary | ICD-10-CM

## 2019-05-13 DIAGNOSIS — I2510 Atherosclerotic heart disease of native coronary artery without angina pectoris: Secondary | ICD-10-CM

## 2019-05-13 DIAGNOSIS — K219 Gastro-esophageal reflux disease without esophagitis: Secondary | ICD-10-CM

## 2019-05-13 DIAGNOSIS — F332 Major depressive disorder, recurrent severe without psychotic features: Secondary | ICD-10-CM

## 2019-05-13 DIAGNOSIS — J439 Emphysema, unspecified: Secondary | ICD-10-CM

## 2019-05-13 DIAGNOSIS — Z8673 Personal history of transient ischemic attack (TIA), and cerebral infarction without residual deficits: Secondary | ICD-10-CM

## 2019-05-13 NOTE — Chronic Care Management (AMB) (Addendum)
Chronic Care Management    Clinical Social Work Follow Up Note  05/13/2019 Name: Lindsay Bradford MRN: [id number] DOB: [birth year]  Lindsay Bradford is a 59 y.o. year old female who is a primary care patient of Lindsay Spencer, FNP. The CCM team was consulted for assistance with [employer] .   Review of patient status, including review of consultants reports, other relevant assessments, and collaboration with appropriate care team members and the patient's provider was performed as part of comprehensive patient evaluation and provision of chronic care management services.    SDOH (Social Determinants of Health) assessments performed: Yes;risk of tobacco use; risk of food insecurity; risk for physical inactivity; risk for depression    Office Visit from 02/17/2019 in [facility]  PHQ-9 Total Score  15      GAD 7 : Generalized Anxiety Score 02/17/2019 10/30/2018  Nervous, Anxious, on Edge 0 1  Control/stop worrying 0 1  Worry too much - different things 0 1  Trouble relaxing 1 1  Restless 0 1  Easily annoyed or irritable 2 0  Afraid - awful might happen 2 0  Total GAD 7 Score 5 5  Anxiety Difficulty Not difficult at all Somewhat difficult     Outpatient Encounter Medications as of 05/13/2019  Medication Sig   albuterol (VENTOLIN HFA) 108 (90 Base) MCG/ACT inhaler Inhale 1-2 puffs into the lungs every 6 (six) hours as needed for wheezing or shortness of breath.   aspirin EC 81 MG EC tablet Take 1 tablet (81 mg total) by mouth daily.   Calcium Carbonate-Vitamin D 600-400 MG-UNIT tablet Take by mouth.   carvedilol (COREG) 12.5 MG tablet Take 1 tablet (12.5 mg total) by mouth 2 (two) times daily with a meal. (Patient not taking: Reported on 04/23/2019)   carvedilol (COREG) 6.25 MG tablet    gabapentin (NEURONTIN) 300 MG capsule    haloperidol (HALDOL) 2 MG tablet    nicotine (NICODERM CQ - DOSED IN MG/24 HOURS) 14 mg/24hr patch Place 1 patch (14 mg total) onto the  skin daily. (Patient not taking: Reported on 04/23/2019)   nitroGLYCERIN (NITROSTAT) 0.4 MG SL tablet Place 1 tablet (0.4 mg total) under the tongue every 5 (five) minutes as needed for chest pain.   omega-3 acid ethyl esters (LOVAZA) 1 g capsule Take 1 capsule (1 g total) by mouth 2 (two) times daily.   pantoprazole (PROTONIX) 40 MG tablet Take 1 tablet (40 mg total) by mouth daily.   ranolazine (RANEXA) 500 MG 12 hr tablet Take 500 mg by mouth 2 (two) times daily.   rivaroxaban (XARELTO) 20 MG TABS tablet Take 1 tablet (20 mg total) by mouth daily.   rosuvastatin (CRESTOR) 40 MG tablet Take 1 tablet (40 mg total) by mouth daily.   tiotropium (SPIRIVA) 18 MCG inhalation capsule Place 1 capsule (18 mcg total) into inhaler and inhale daily.   topiramate (TOPAMAX) 100 MG tablet Take 100 mg by mouth daily.   valACYclovir (VALTREX) 500 MG tablet Take 1 tablet (500 mg total) by mouth daily.   venlafaxine XR (EFFEXOR-XR) 75 MG 24 hr capsule Take 1 capsule (75 mg total) by mouth daily with breakfast.   No facility-administered encounter medications on file as of 05/13/2019.    LCSW called phone number for client several times today and was unable to speak via phone with client. LCSW did leave phone message requesting client please return call to LCSW to discuss current client needs   Follow Up Plan: LCSW  to call client in next 4 weeks to talk with her about community resources of assistance to client  Norva Riffle.Antonios Ostrow MSW, LCSW Licensed Clinical Social Worker [facility] [phone]  I have reviewed and agree with the above documentation.   Evelina Dun, FNP

## 2019-05-13 NOTE — Patient Instructions (Addendum)
Licensed Clinical Social Worker Visit Information  Materials Provided: No  Zacaria Pousson is a 59 y.o. year old female who is a primary care patient of [name], FNP. The CCM team was consulted for assistance with [employer] .   Review of patient status, including review of consultants reports, other relevant assessments, and collaboration with appropriate care team members and the patient's provider was performed as part of comprehensive patient evaluation and provision of chronic care management services.   SDOH (Social Determinants of Health) assessments performed: Yes;risk of tobacco use; risk of food insecurity; risk for physical inactivity; risk for depression  LCSW called phone number for client several times today and was unable to speak via phone with client. LCSW did leave phone message requesting client please return call to LCSW to discuss current client needs   Follow Up Plan: LCSW to call client in next 4 weeks to talk with her about community resources of assistance to client  LCSW was not able to speak via phone with client today; thus the patient was not able to  verbalize understanding of instructions provided today and was not able to accept or  decline a print copy of patient instruction materials.   [name].Shloimy Michalski MSW, LCSW Licensed Clinical Social Worker [facility] [phone]

## 2019-05-15 DIAGNOSIS — E7800 Pure hypercholesterolemia, unspecified: Secondary | ICD-10-CM | POA: Diagnosis not present

## 2019-05-15 DIAGNOSIS — H5210 Myopia, unspecified eye: Secondary | ICD-10-CM | POA: Diagnosis not present

## 2019-05-15 DIAGNOSIS — H2513 Age-related nuclear cataract, bilateral: Secondary | ICD-10-CM | POA: Diagnosis not present

## 2019-05-15 DIAGNOSIS — I10 Essential (primary) hypertension: Secondary | ICD-10-CM | POA: Diagnosis not present

## 2019-05-15 DIAGNOSIS — Z0100 Encounter for examination of eyes and vision without abnormal findings: Secondary | ICD-10-CM | POA: Diagnosis not present

## 2019-05-16 ENCOUNTER — Encounter: Payer: Self-pay | Admitting: Family

## 2019-05-16 ENCOUNTER — Ambulatory Visit (INDEPENDENT_AMBULATORY_CARE_PROVIDER_SITE_OTHER): Payer: Medicare HMO | Admitting: Family

## 2019-05-16 ENCOUNTER — Other Ambulatory Visit: Payer: Self-pay | Admitting: Family

## 2019-05-16 DIAGNOSIS — R1031 Right lower quadrant pain: Secondary | ICD-10-CM | POA: Diagnosis not present

## 2019-05-16 LAB — URINALYSIS, COMPLETE
Bilirubin, UA: NEGATIVE
Glucose, UA: NEGATIVE
Ketones, UA: NEGATIVE
Nitrite, UA: NEGATIVE
Protein,UA: NEGATIVE
Specific Gravity, UA: 1.02 (ref 1.005–1.030)
Urobilinogen, Ur: 0.2 mg/dL (ref 0.2–1.0)
pH, UA: 7.5 (ref 5.0–7.5)

## 2019-05-16 LAB — MICROSCOPIC EXAMINATION

## 2019-05-16 NOTE — Progress Notes (Signed)
   Virtual Visit via telephone Note Due to COVID-19 pandemic this visit was conducted virtually. This visit type was conducted due to national recommendations for restrictions regarding the COVID-19 Pandemic (e.g. social distancing, sheltering in place) in an effort to limit this patient's exposure and mitigate transmission in our community. All issues noted in this document were discussed and addressed.  A physical exam was not performed with this format.  I connected with Lindsay Bradford on 05/16/19 at 11:32 AM by telephone and verified that I am speaking with the correct person using two identifiers. Lindsay Bradford is currently located at home and no one is currently with her during visit. The provider, Jannifer Rodney, FNP is located in their office at time of visit.  I discussed the limitations, risks, security and privacy concerns of performing an evaluation and management service by telephone and the availability of in person appointments. I also discussed with the patient that there may be a patient responsible charge related to this service. The patient expressed understanding and agreed to proceed.   History and Present Illness:  HPI  PT calls the office today with right lower side pain that started three days ago. She states she is followed by Urologists. She is scheduled for CT scan on 05/23/19 to rule out kidney stones, tumor, or scar tissue. She was set up with Urologists for recurrent UTI symptoms.   Pt denies any dysuria, fever, nausea & Vomiting, or constipation.   She does report urinary frequency and having to wake up 3 times at night. Also urgency.   She has hx of appendectomy.   Review of Systems  All other systems reviewed and are negative.    Observations/Objective: No SOB or distress noted   Assessment and Plan: [name] comes in today with chief complaint of No chief complaint on file.   Diagnosis and orders addressed:  1. Right lower quadrant abdominal  pain Pt will come in to bring in urine and do CBC to rule out elevated WBC Keep Urologists  Force fluids - Urinalysis, Complete - Urine Culture - CBC with Differential/Platelet      I discussed the assessment and treatment plan with the patient. The patient was provided an opportunity to ask questions and all were answered. The patient agreed with the plan and demonstrated an understanding of the instructions.   The patient was advised to call back or seek an in-person evaluation if the symptoms worsen or if the condition fails to improve as anticipated.  The above assessment and management plan was discussed with the patient. The patient verbalized understanding of and has agreed to the management plan. Patient is aware to call the clinic if symptoms persist or worsen. Patient is aware when to return to the clinic for a follow-up visit. Patient educated on when it is appropriate to go to the emergency department.   Time call ended: 11:48 AM   I provided 16 minutes of non-face-to-face time during this encounter.    Jannifer Rodney, FNP

## 2019-05-17 DIAGNOSIS — S82851A Displaced trimalleolar fracture of right lower leg, initial encounter for closed fracture: Secondary | ICD-10-CM | POA: Diagnosis not present

## 2019-05-17 LAB — CBC WITH DIFFERENTIAL/PLATELET
Basophils Absolute: 0.1 10*3/uL (ref 0.0–0.2)
Basos: 2 %
EOS (ABSOLUTE): 0.4 10*3/uL (ref 0.0–0.4)
Eos: 7 %
Hematocrit: 48.9 % — ABNORMAL HIGH (ref 34.0–46.6)
Hemoglobin: 16.1 g/dL — ABNORMAL HIGH (ref 11.1–15.9)
Immature Grans (Abs): 0 10*3/uL (ref 0.0–0.1)
Immature Granulocytes: 0 %
Lymphocytes Absolute: 2.2 10*3/uL (ref 0.7–3.1)
Lymphs: 35 %
MCH: 31.8 pg (ref 26.6–33.0)
MCHC: 32.9 g/dL (ref 31.5–35.7)
MCV: 96 fL (ref 79–97)
Monocytes Absolute: 0.6 10*3/uL (ref 0.1–0.9)
Monocytes: 9 %
Neutrophils Absolute: 3 10*3/uL (ref 1.4–7.0)
Neutrophils: 47 %
Platelets: 264 10*3/uL (ref 150–450)
RBC: 5.07 x10E6/uL (ref 3.77–5.28)
RDW: 13.6 % (ref 11.7–15.4)
WBC: 6.4 10*3/uL (ref 3.4–10.8)

## 2019-05-17 LAB — URINE CULTURE

## 2019-05-19 ENCOUNTER — Ambulatory Visit: Payer: Medicare HMO | Admitting: Family

## 2019-05-23 ENCOUNTER — Encounter (HOSPITAL_COMMUNITY): Payer: Self-pay

## 2019-05-23 ENCOUNTER — Ambulatory Visit (HOSPITAL_COMMUNITY)
Admission: RE | Admit: 2019-05-23 | Discharge: 2019-05-23 | Disposition: A | Discharge status: Home / Self Care | Payer: Medicare HMO | Source: Ambulatory Visit | Attending: Urology | Admitting: Urology

## 2019-05-23 ENCOUNTER — Other Ambulatory Visit: Payer: Self-pay

## 2019-05-23 DIAGNOSIS — R310 Gross hematuria: Secondary | ICD-10-CM

## 2019-05-23 LAB — POCT I-STAT CREATININE: Creatinine, Ser: 0.8 mg/dL (ref 0.44–1.00)

## 2019-05-23 MED ORDER — IOHEXOL 300 MG/ML  SOLN
125.0000 mL | Freq: Once | INTRAMUSCULAR | Status: AC | PRN
  Administered 2019-05-23: 125 mL via INTRAVENOUS

## 2019-05-27 ENCOUNTER — Encounter: Payer: Self-pay | Admitting: Family

## 2019-05-28 ENCOUNTER — Ambulatory Visit (INDEPENDENT_AMBULATORY_CARE_PROVIDER_SITE_OTHER): Payer: Medicare HMO | Admitting: Urology

## 2019-05-28 ENCOUNTER — Other Ambulatory Visit: Payer: Self-pay

## 2019-05-28 ENCOUNTER — Encounter: Payer: Self-pay | Admitting: Urology

## 2019-05-28 VITALS — BP 91/63 | HR 80 | Temp 97.5°F | Ht 68.0 in | Wt 200.0 lb

## 2019-05-28 DIAGNOSIS — R310 Gross hematuria: Secondary | ICD-10-CM | POA: Diagnosis not present

## 2019-05-28 LAB — POCT URINALYSIS DIPSTICK
Bilirubin, UA: NEGATIVE
Glucose, UA: NEGATIVE
Ketones, UA: NEGATIVE
Nitrite, UA: NEGATIVE
Protein, UA: POSITIVE — AB
Spec Grav, UA: >=1.03 — AB (ref 1.010–1.025)
Urobilinogen, UA: 0.2 E.U./dL
pH, UA: 6 (ref 5.0–8.0)

## 2019-05-28 MED ORDER — CIPROFLOXACIN HCL 500 MG PO TABS
500.0000 mg | ORAL_TABLET | Freq: Once | ORAL | Status: AC
Start: 2019-05-28 — End: 2019-05-28
  Administered 2019-05-28: 14:00:00 500 mg via ORAL

## 2019-05-28 NOTE — Progress Notes (Signed)

## 2019-05-28 NOTE — Progress Notes (Signed)
   05/28/19  CC: gross hematuria  HPI: Lindsay Bradford is a 58yo here for followup for gross hematuria. She has not had gross hematuria in several months. NO change in urinary frequency or urgency. CT hematuria protocol from 5/7 images were reviewed and discussed with the patient. There were no GU abnormalities   Blood pressure 91/63, pulse 80, temperature (!) 97.5 F (36.4 C), height 5\' 8"  (1.727 m), weight 200 lb (90.7 kg). NED. A&Ox3.   No respiratory distress   Abd soft, NT, ND Normal external genitalia with patent urethral meatus  Cystoscopy Procedure Note  Patient identification was confirmed, informed consent was obtained, and patient was prepped using Betadine solution.  Lidocaine jelly was administered per urethral meatus.    Procedure: - Flexible cystoscope introduced, without any difficulty.   - Thorough search of the bladder revealed:    normal urethral meatus    normal urothelium    no stones    no ulcers     no tumors    no urethral polyps    no trabeculation  - Ureteral orifices were normal in position and appearance.  Post-Procedure: - Patient tolerated the procedure well  Assessment/ Plan: Gross hematuria (workup negative) -RTC 3 months with UA -premarin 3x per week for urinary frequency and urgency, OAb symptoms.    No follow-ups on file.  , MD

## 2019-05-28 NOTE — Patient Instructions (Signed)
Hematuria, Adult Hematuria is blood in the urine. Blood may be visible in the urine, or it may be identified with a test. This condition can be caused by infections of the bladder, urethra, kidney, or prostate. Other possible causes include:  Kidney stones.  Cancer of the urinary tract.  Too much calcium in the urine.  Conditions that are passed from parent to child (inherited conditions).  Exercise that requires a lot of energy. Infections can usually be treated with medicine, and a kidney stone usually will pass through your urine. If neither of these is the cause of your hematuria, more tests may be needed to identify the cause of your symptoms. It is very important to tell your health care provider about any blood in your urine, even if it is painless or the blood stops without treatment. Blood in the urine, when it happens and then stops and then happens again, can be a symptom of a very serious condition, including cancer. There is no pain in the initial stages of many urinary cancers. Follow these instructions at home: Medicines  Take over-the-counter and prescription medicines only as told by your health care provider.  If you were prescribed an antibiotic medicine, take it as told by your health care provider. Do not stop taking the antibiotic even if you start to feel better. Eating and drinking  Drink enough fluid to keep your urine clear or pale yellow. It is recommended that you drink 3-4 quarts (2.8-3.8 L) a day. If you have been diagnosed with an infection, it is recommended that you drink cranberry juice in addition to large amounts of water.  Avoid caffeine, tea, and carbonated beverages. These tend to irritate the bladder.  Avoid alcohol because it may irritate the prostate (men). General instructions  If you have been diagnosed with a kidney stone, follow your health care provider's instructions about straining your urine to catch the stone.  Empty your bladder  often. Avoid holding urine for long periods of time.  If you are female: ? After a bowel movement, wipe from front to back and use each piece of toilet paper only once. ? Empty your bladder before and after sex.  Pay attention to any changes in your symptoms. Tell your health care provider about any changes or any new symptoms.  It is your responsibility to get your test results. Ask your health care provider, or the department performing the test, when your results will be ready.  Keep all follow-up visits as told by your health care provider. This is important. Contact a health care provider if:  You develop back pain.  You have a fever.  You have nausea or vomiting.  Your symptoms do not improve after 3 days.  Your symptoms get worse. Get help right away if:  You develop severe vomiting and are unable take medicine without vomiting.  You develop severe pain in your back or abdomen even though you are taking medicine.  You pass a large amount of blood in your urine.  You pass blood clots in your urine.  You feel very weak or like you might faint.  You faint. Summary  Hematuria is blood in the urine. It has many possible causes.  It is very important that you tell your health care provider about any blood in your urine, even if it is painless or the blood stops without treatment.  Take over-the-counter and prescription medicines only as told by your health care provider.  Drink enough fluid to keep   your urine clear or pale yellow. This information is not intended to replace advice given to you by your health care provider. Make sure you discuss any questions you have with your health care provider. Document Revised: 05/29/2018 Document Reviewed: 02/05/2016 Elsevier Patient Education  2020 [employer]  

## 2019-05-29 ENCOUNTER — Other Ambulatory Visit: Payer: Self-pay | Admitting: Family

## 2019-05-29 DIAGNOSIS — I2510 Atherosclerotic heart disease of native coronary artery without angina pectoris: Secondary | ICD-10-CM

## 2019-05-29 DIAGNOSIS — Z8673 Personal history of transient ischemic attack (TIA), and cerebral infarction without residual deficits: Secondary | ICD-10-CM

## 2019-05-29 DIAGNOSIS — I739 Peripheral vascular disease, unspecified: Secondary | ICD-10-CM

## 2019-05-29 DIAGNOSIS — E785 Hyperlipidemia, unspecified: Secondary | ICD-10-CM

## 2019-06-10 ENCOUNTER — Other Ambulatory Visit: Payer: Self-pay | Admitting: Psychiatry

## 2019-06-17 ENCOUNTER — Telehealth: Payer: Medicare HMO

## 2019-06-19 DIAGNOSIS — S82851A Displaced trimalleolar fracture of right lower leg, initial encounter for closed fracture: Secondary | ICD-10-CM | POA: Diagnosis not present

## 2019-07-02 ENCOUNTER — Other Ambulatory Visit: Payer: Self-pay | Admitting: Psychiatry

## 2019-07-03 ENCOUNTER — Encounter: Payer: Self-pay | Admitting: Family

## 2019-07-03 ENCOUNTER — Ambulatory Visit (INDEPENDENT_AMBULATORY_CARE_PROVIDER_SITE_OTHER): Payer: Medicare HMO | Admitting: Family

## 2019-07-03 DIAGNOSIS — J439 Emphysema, unspecified: Secondary | ICD-10-CM

## 2019-07-03 MED ORDER — PREDNISONE 10 MG (21) PO TBPK: ORAL_TABLET | ORAL | 0 refills | Status: DC

## 2019-07-03 NOTE — Progress Notes (Signed)
Virtual Visit via telephone Note Due to COVID-19 pandemic this visit was conducted virtually. This visit type was conducted due to national recommendations for restrictions regarding the COVID-19 Pandemic (e.g. social distancing, sheltering in place) in an effort to limit this patient's exposure and mitigate transmission in our community. All issues noted in this document were discussed and addressed.  A physical exam was not performed with this format.  I connected with Lindsay Bradford on 07/03/19 at 10:28 AM by telephone and verified that I am speaking with the correct person using two identifiers. Lindsay Bradford is currently located at home and no one is currently with her during visit. The provider, Jannifer Rodney, FNP is located in their office at time of visit.  I discussed the limitations, risks, security and privacy concerns of performing an evaluation and management service by telephone and the availability of in person appointments. I also discussed with the patient that there may be a patient responsible charge related to this service. The patient expressed understanding and agreed to proceed.   History and Present Illness:  Cough This is a new problem. The current episode started in the past 7 days. The problem has been unchanged. The problem occurs every few minutes. The cough is productive of sputum. Associated symptoms include rhinorrhea, shortness of breath and wheezing. Pertinent negatives include no chills, ear congestion, ear pain, fever, headaches or myalgias. The symptoms are aggravated by lying down. Risk factors for lung disease include smoking/tobacco exposure. She has tried a beta-agonist inhaler (spiriva) for the symptoms. Her past medical history is significant for asthma and emphysema.      Review of Systems  Constitutional: Negative for chills and fever.  HENT: Positive for rhinorrhea. Negative for ear pain.   Respiratory: Positive for cough, shortness of breath and  wheezing.   Musculoskeletal: Negative for myalgias.  Neurological: Negative for headaches.  All other systems reviewed and are negative.    Observations/Objective: No SOB or distress noted, hoarse voice  Assessment and Plan: 1. Pulmonary emphysema, unspecified emphysema type (HCC) Will hold off on antibiotics at this time given allergy  List and no fever. She will call if symptoms worsen or do not improve over the next 1-2 days - Use a cool mist humidifier  -Use saline nose sprays frequently -Force fluids -For any cough or congestion  Use plain Mucinex- regular strength or max strength is fine -For fever or aces or pains- take tylenol or ibuprofen. -Throat lozenges if help -N - predniSONE (STERAPRED UNI-PAK 21 TAB) 10 MG (21) TBPK tablet; Use as directed  Dispense: 21 tablet; Refill: 0    I discussed the assessment and treatment plan with the patient. The patient was provided an opportunity to ask questions and all were answered. The patient agreed with the plan and demonstrated an understanding of the instructions.   The patient was advised to call back or seek an in-person evaluation if the symptoms worsen or if the condition fails to improve as anticipated.  The above assessment and management plan was discussed with the patient. The patient verbalized understanding of and has agreed to the management plan. Patient is aware to call the clinic if symptoms persist or worsen. Patient is aware when to return to the clinic for a follow-up visit. Patient educated on when it is appropriate to go to the emergency department.   Time call ended:  10:37 AM  I provided 9 minutes of non-face-to-face time during this encounter.    Jannifer Rodney, FNP

## 2019-07-06 DIAGNOSIS — Z86718 Personal history of other venous thrombosis and embolism: Secondary | ICD-10-CM | POA: Diagnosis not present

## 2019-07-06 DIAGNOSIS — E7800 Pure hypercholesterolemia, unspecified: Secondary | ICD-10-CM | POA: Diagnosis not present

## 2019-07-06 DIAGNOSIS — R7303 Prediabetes: Secondary | ICD-10-CM | POA: Diagnosis not present

## 2019-07-06 DIAGNOSIS — M1990 Unspecified osteoarthritis, unspecified site: Secondary | ICD-10-CM | POA: Diagnosis not present

## 2019-07-06 DIAGNOSIS — R918 Other nonspecific abnormal finding of lung field: Secondary | ICD-10-CM | POA: Diagnosis not present

## 2019-07-06 DIAGNOSIS — I252 Old myocardial infarction: Secondary | ICD-10-CM | POA: Diagnosis not present

## 2019-07-06 DIAGNOSIS — M797 Fibromyalgia: Secondary | ICD-10-CM | POA: Diagnosis not present

## 2019-07-06 DIAGNOSIS — R0602 Shortness of breath: Secondary | ICD-10-CM | POA: Diagnosis not present

## 2019-07-06 DIAGNOSIS — D6851 Activated protein C resistance: Secondary | ICD-10-CM | POA: Diagnosis not present

## 2019-07-06 DIAGNOSIS — J441 Chronic obstructive pulmonary disease with (acute) exacerbation: Secondary | ICD-10-CM | POA: Diagnosis not present

## 2019-07-06 DIAGNOSIS — J449 Chronic obstructive pulmonary disease, unspecified: Secondary | ICD-10-CM | POA: Diagnosis not present

## 2019-07-09 ENCOUNTER — Other Ambulatory Visit: Payer: Self-pay | Admitting: Family

## 2019-07-09 DIAGNOSIS — R0600 Dyspnea, unspecified: Secondary | ICD-10-CM | POA: Diagnosis not present

## 2019-07-09 DIAGNOSIS — F331 Major depressive disorder, recurrent, moderate: Secondary | ICD-10-CM

## 2019-07-09 DIAGNOSIS — R062 Wheezing: Secondary | ICD-10-CM | POA: Diagnosis not present

## 2019-07-09 DIAGNOSIS — J439 Emphysema, unspecified: Secondary | ICD-10-CM | POA: Diagnosis not present

## 2019-07-17 ENCOUNTER — Other Ambulatory Visit: Payer: Self-pay | Admitting: Family

## 2019-07-17 DIAGNOSIS — K219 Gastro-esophageal reflux disease without esophagitis: Secondary | ICD-10-CM

## 2019-07-19 DIAGNOSIS — S82851A Displaced trimalleolar fracture of right lower leg, initial encounter for closed fracture: Secondary | ICD-10-CM | POA: Diagnosis not present

## 2019-07-22 ENCOUNTER — Telehealth: Payer: Medicare HMO

## 2019-07-27 ENCOUNTER — Other Ambulatory Visit: Payer: Self-pay | Admitting: Family

## 2019-08-08 DIAGNOSIS — R0600 Dyspnea, unspecified: Secondary | ICD-10-CM | POA: Diagnosis not present

## 2019-08-08 DIAGNOSIS — J439 Emphysema, unspecified: Secondary | ICD-10-CM | POA: Diagnosis not present

## 2019-08-08 DIAGNOSIS — R062 Wheezing: Secondary | ICD-10-CM | POA: Diagnosis not present

## 2019-08-14 ENCOUNTER — Other Ambulatory Visit: Payer: Self-pay | Admitting: Family

## 2019-08-14 DIAGNOSIS — F332 Major depressive disorder, recurrent severe without psychotic features: Secondary | ICD-10-CM

## 2019-08-14 NOTE — Telephone Encounter (Signed)
°  Prescription Request  08/14/2019  What is the name of the medication or equipment? venlafaxine XR (EFFEXOR-XR) 75 MG 24 hr capsule  Have you contacted your pharmacy to request a refill? (if applicable) yes  [facility]   Patient notified that their request is being sent to the clinical staff for review and that they should receive a response within 2 business days.

## 2019-08-15 MED ORDER — VENLAFAXINE HCL ER 75 MG PO CP24: 75.0000 mg | ORAL_CAPSULE | Freq: Every day | ORAL | 3 refills | Status: DC

## 2019-08-15 NOTE — Telephone Encounter (Signed)
Patient notified that [facility]. Patient verbalized understanding

## 2019-08-15 NOTE — Telephone Encounter (Signed)
TC to patient about refill on Venlafaxine, she says she is on 150 mg and uses [facility] Please advise on strength

## 2019-08-15 NOTE — Telephone Encounter (Signed)
Pt checking status on med refill 

## 2019-08-15 NOTE — Telephone Encounter (Signed)
Effexor 75 mg Prescription sent to pharmacy, this is what dose is in Epic.

## 2019-08-19 DIAGNOSIS — S82851A Displaced trimalleolar fracture of right lower leg, initial encounter for closed fracture: Secondary | ICD-10-CM | POA: Diagnosis not present

## 2019-08-22 ENCOUNTER — Telehealth: Payer: Medicare HMO

## 2019-08-27 ENCOUNTER — Ambulatory Visit: Payer: Medicare HMO | Admitting: Urology

## 2019-08-29 ENCOUNTER — Telehealth: Payer: Self-pay | Admitting: Family

## 2019-08-29 DIAGNOSIS — M25571 Pain in right ankle and joints of right foot: Secondary | ICD-10-CM

## 2019-08-29 DIAGNOSIS — G8929 Other chronic pain: Secondary | ICD-10-CM

## 2019-08-29 NOTE — Telephone Encounter (Signed)
Referral placed.

## 2019-08-29 NOTE — Telephone Encounter (Signed)
  REFERRAL REQUEST Telephone Note 08/29/2019  What type of referral do you need? [facility] for [facility]  Why do you need this referral? For inaurance  Have you been seen at our office for this problem? yes (Advise that they may need an appointment with their PCP before a referral can be done)  Is there a particular doctor or location that you prefer? [facility]-has appt with them next Tuesday Fax#: [phone]  Patient notified that referrals can take up to a week or longer to process. If they haven't heard anything within a week they should call back and speak with the referral department.

## 2019-09-04 ENCOUNTER — Telehealth: Payer: Self-pay | Admitting: Family

## 2019-09-08 DIAGNOSIS — R0600 Dyspnea, unspecified: Secondary | ICD-10-CM | POA: Diagnosis not present

## 2019-09-08 DIAGNOSIS — R062 Wheezing: Secondary | ICD-10-CM | POA: Diagnosis not present

## 2019-09-08 DIAGNOSIS — J439 Emphysema, unspecified: Secondary | ICD-10-CM | POA: Diagnosis not present

## 2019-09-19 DIAGNOSIS — S82851A Displaced trimalleolar fracture of right lower leg, initial encounter for closed fracture: Secondary | ICD-10-CM | POA: Diagnosis not present

## 2019-09-29 ENCOUNTER — Ambulatory Visit: Payer: Medicare HMO | Admitting: Licensed Clinical Social Worker

## 2019-09-29 DIAGNOSIS — F332 Major depressive disorder, recurrent severe without psychotic features: Secondary | ICD-10-CM

## 2019-09-29 DIAGNOSIS — J439 Emphysema, unspecified: Secondary | ICD-10-CM

## 2019-09-29 DIAGNOSIS — I2510 Atherosclerotic heart disease of native coronary artery without angina pectoris: Secondary | ICD-10-CM

## 2019-09-29 DIAGNOSIS — K219 Gastro-esophageal reflux disease without esophagitis: Secondary | ICD-10-CM

## 2019-09-29 DIAGNOSIS — Z8673 Personal history of transient ischemic attack (TIA), and cerebral infarction without residual deficits: Secondary | ICD-10-CM

## 2019-09-29 DIAGNOSIS — Z86718 Personal history of other venous thrombosis and embolism: Secondary | ICD-10-CM

## 2019-09-29 DIAGNOSIS — Z9861 Coronary angioplasty status: Secondary | ICD-10-CM

## 2019-09-29 NOTE — Patient Instructions (Addendum)
Licensed Clinical Social Worker Visit Information  Goals we discussed today:   .  Client will talk with LCSW in next 30 days to discuss community resources of help in area (pt-stated)        Current Barriers:   Mobility challenges (uses wheelchair to ambulate) in patient with chronic diagnoses of GERD, Major Depressive Disorder, Hx DVT, Hx CVA, COPD and CAD  Needs help with ADLs  Clinical Social Work Clinical Goal(s):   Client and LCSW to talk in next 30 days to discuss community resources of help to client  Interventions:  Talked with [name] about CCM program and services  Talked with Kloie about her mobility challenges   Talked with Romonia previously about in home support services through [employer] (LCSW provided [place] with number for [employer])  Talked with Anahy about transport needs of client  Talked with Andora about social support network (receives support from her son, Madelaine Bhat,; receives support from grandson, and receives some help from her daughter)   Encouraged Bettina to call RNCM as needed for nursing suppot  Patient Self Care Activities:   Takes medications as prescribed  Attends scheduled medical appointments  Plan:  LCSW to call client in next 4 weeks to talk with her about community resources of help to client Client to attends scheduled medical appointments Client to communicate with RNCM as needed to discuss nursing needs Client to participate in physical therapy sessions as scheduled  Initial Goal Documentation       Follow Up Plan: LCSW to call client in next 4 weeks to talk with her about community resources of assistance to client  Materials Provided: No  The patient verbalized understanding of instructions provided today and declined a print copy of patient instruction materials.   [name] MSW, LCSW Licensed Clinical Social Worker [facility] Care Management [phone]

## 2019-09-29 NOTE — Chronic Care Management (AMB) (Addendum)
Chronic Care Management    Clinical Social Work Follow Up Note  09/29/2019 Name: Lindsay Bradford MRN: [id number] DOB: 12/18/1960  Lindsay Bradford is a 59 y.o. year old female who is a primary care patient of [name], FNP. The CCM team was consulted for assistance with [employer] .   Review of patient status, including review of consultants reports, other relevant assessments, and collaboration with appropriate care team members and the patient's provider was performed as part of comprehensive patient evaluation and provision of chronic care management services.    SDOH (Social Determinants of Health) assessments performed: No; risk for social isolation; risk for tobacco use; risk for depression; risk for physical inactivity; risk for food insecurity    Office Visit from 02/17/2019 in [facility]  PHQ-9 Total Score 15         GAD 7 : Generalized Anxiety Score 02/17/2019 10/30/2018  Nervous, Anxious, on Edge 0 1  Control/stop worrying 0 1  Worry too much - different things 0 1  Trouble relaxing 1 1  Restless 0 1  Easily annoyed or irritable 2 0  Afraid - awful might happen 2 0  Total GAD 7 Score 5 5  Anxiety Difficulty Not difficult at all Somewhat difficult    Outpatient Encounter Medications as of 09/29/2019  Medication Sig   albuterol (VENTOLIN HFA) 108 (90 Base) MCG/ACT inhaler Inhale 1-2 puffs into the lungs every 6 (six) hours as needed for wheezing or shortness of breath.   aspirin EC 81 MG EC tablet Take 1 tablet (81 mg total) by mouth daily.   Calcium Carbonate-Vitamin D 600-400 MG-UNIT tablet Take by mouth.   carvedilol (COREG) 12.5 MG tablet Take 1 tablet (12.5 mg total) by mouth 2 (two) times daily with a meal.   carvedilol (COREG) 6.25 MG tablet    gabapentin (NEURONTIN) 300 MG capsule    haloperidol (HALDOL) 2 MG tablet    nicotine (NICODERM CQ - DOSED IN MG/24 HOURS) 14 mg/24hr patch Place 1 patch (14 mg total) onto the skin daily.    nitroGLYCERIN (NITROSTAT) 0.4 MG SL tablet Place 1 tablet (0.4 mg total) under the tongue every 5 (five) minutes as needed for chest pain.   omega-3 acid ethyl esters (LOVAZA) 1 g capsule Take 1 capsule (1 g total) by mouth 2 (two) times daily.   pantoprazole (PROTONIX) 40 MG tablet TAKE 1 TABLET BY MOUTH EVERY DAY   ranolazine (RANEXA) 500 MG 12 hr tablet Take 500 mg by mouth 2 (two) times daily.   rivaroxaban (XARELTO) 20 MG TABS tablet Take 1 tablet (20 mg total) by mouth daily.   rosuvastatin (CRESTOR) 40 MG tablet TAKE 1 TABLET BY MOUTH EVERY DAY   tiotropium (SPIRIVA) 18 MCG inhalation capsule Place 1 capsule (18 mcg total) into inhaler and inhale daily.   topiramate (TOPAMAX) 100 MG tablet Take 100 mg by mouth daily.   traZODone (DESYREL) 150 MG tablet    valACYclovir (VALTREX) 500 MG tablet TAKE 1 TABLET BY MOUTH EVERY DAY   venlafaxine XR (EFFEXOR-XR) 75 MG 24 hr capsule Take 1 capsule (75 mg total) by mouth daily with breakfast.   No facility-administered encounter medications on file as of 09/29/2019.    Goals            Client will talk with LCSW in next 30 days to discuss community resources of help in area (pt-stated)      Current Barriers:  Mobility challenges (uses wheelchair to ambulate) in  patient with chronic diagnoses of GERD, Major Depressive Disorder, Hx DVT, Hx CVA, COPD and CAD Needs help with ADLs  Clinical Social Work Clinical Goal(s):  Client and LCSW to talk in next 30 days to discuss community resources of help to client  Interventions: Talked with [name] about CCM program and services Talked with Tsuyako about her mobility challenges  Talked with Liddy previously about in home support services through [employer] (LCSW provided [place] with number for [employer]) Talked with Melvia about transport needs of client Talked with Brandalyn about social support network (receives support from her son, Madelaine Bhat,; receives support from grandson, and receives some help from her daughter)  Encouraged  Romanita to call RNCM as needed for nursing suppot  Patient Self Care Activities:  Takes medications as prescribed Attends scheduled medical appointments  Plan:  LCSW to call client in next 4 weeks to talk with her about community resources of help to client Client to attends scheduled medical appointments Client to communicate with RNCM as needed to discuss nursing needs Client to participate in physical therapy sessions as scheduled  Initial Goal Documentation        Follow Up Plan: LCSW to call client in next 4 weeks to talk with her about community resources of assistance to client   [name].Sherlie Boyum MSW, LCSW Licensed Clinical Social Worker [facility] Care Management [phone]  I have reviewed the CCM documentation and agree with the written assessment and plan of care.  Jannifer Rodney, FNP

## 2019-10-09 DIAGNOSIS — J439 Emphysema, unspecified: Secondary | ICD-10-CM | POA: Diagnosis not present

## 2019-10-09 DIAGNOSIS — R0600 Dyspnea, unspecified: Secondary | ICD-10-CM | POA: Diagnosis not present

## 2019-10-09 DIAGNOSIS — R062 Wheezing: Secondary | ICD-10-CM | POA: Diagnosis not present

## 2019-10-12 ENCOUNTER — Other Ambulatory Visit: Payer: Self-pay | Admitting: Family

## 2019-10-12 DIAGNOSIS — J42 Unspecified chronic bronchitis: Secondary | ICD-10-CM

## 2019-10-19 DIAGNOSIS — S82851A Displaced trimalleolar fracture of right lower leg, initial encounter for closed fracture: Secondary | ICD-10-CM | POA: Diagnosis not present

## 2019-11-02 ENCOUNTER — Other Ambulatory Visit: Payer: Self-pay | Admitting: Family

## 2019-11-07 ENCOUNTER — Other Ambulatory Visit: Payer: Self-pay | Admitting: Family

## 2019-11-07 ENCOUNTER — Telehealth: Payer: Medicare HMO

## 2019-11-07 DIAGNOSIS — I2510 Atherosclerotic heart disease of native coronary artery without angina pectoris: Secondary | ICD-10-CM

## 2019-11-07 DIAGNOSIS — I739 Peripheral vascular disease, unspecified: Secondary | ICD-10-CM

## 2019-11-07 DIAGNOSIS — E785 Hyperlipidemia, unspecified: Secondary | ICD-10-CM

## 2019-11-07 DIAGNOSIS — Z8673 Personal history of transient ischemic attack (TIA), and cerebral infarction without residual deficits: Secondary | ICD-10-CM

## 2019-11-17 DIAGNOSIS — R0600 Dyspnea, unspecified: Secondary | ICD-10-CM | POA: Diagnosis not present

## 2019-11-17 DIAGNOSIS — J439 Emphysema, unspecified: Secondary | ICD-10-CM | POA: Diagnosis not present

## 2019-11-17 DIAGNOSIS — R062 Wheezing: Secondary | ICD-10-CM | POA: Diagnosis not present

## 2019-11-19 DIAGNOSIS — S82851A Displaced trimalleolar fracture of right lower leg, initial encounter for closed fracture: Secondary | ICD-10-CM | POA: Diagnosis not present

## 2019-12-03 ENCOUNTER — Telehealth: Payer: Medicare HMO

## 2019-12-25 ENCOUNTER — Other Ambulatory Visit: Payer: Self-pay | Admitting: Family

## 2020-01-06 DIAGNOSIS — Z8673 Personal history of transient ischemic attack (TIA), and cerebral infarction without residual deficits: Secondary | ICD-10-CM | POA: Diagnosis not present

## 2020-01-06 DIAGNOSIS — I255 Ischemic cardiomyopathy: Secondary | ICD-10-CM | POA: Diagnosis not present

## 2020-01-06 DIAGNOSIS — I2510 Atherosclerotic heart disease of native coronary artery without angina pectoris: Secondary | ICD-10-CM | POA: Diagnosis not present

## 2020-01-06 DIAGNOSIS — D6851 Activated protein C resistance: Secondary | ICD-10-CM | POA: Diagnosis not present

## 2020-01-06 DIAGNOSIS — I252 Old myocardial infarction: Secondary | ICD-10-CM | POA: Diagnosis not present

## 2020-01-06 DIAGNOSIS — Z955 Presence of coronary angioplasty implant and graft: Secondary | ICD-10-CM | POA: Diagnosis not present

## 2020-01-07 ENCOUNTER — Telehealth: Payer: Medicare HMO

## 2020-01-08 DIAGNOSIS — R062 Wheezing: Secondary | ICD-10-CM | POA: Diagnosis not present

## 2020-01-08 DIAGNOSIS — J439 Emphysema, unspecified: Secondary | ICD-10-CM | POA: Diagnosis not present

## 2020-01-08 DIAGNOSIS — R0600 Dyspnea, unspecified: Secondary | ICD-10-CM | POA: Diagnosis not present

## 2020-01-21 ENCOUNTER — Other Ambulatory Visit: Payer: Self-pay | Admitting: Family

## 2020-01-21 DIAGNOSIS — E785 Hyperlipidemia, unspecified: Secondary | ICD-10-CM

## 2020-01-21 DIAGNOSIS — I739 Peripheral vascular disease, unspecified: Secondary | ICD-10-CM

## 2020-01-21 DIAGNOSIS — Z8673 Personal history of transient ischemic attack (TIA), and cerebral infarction without residual deficits: Secondary | ICD-10-CM

## 2020-01-21 DIAGNOSIS — I2510 Atherosclerotic heart disease of native coronary artery without angina pectoris: Secondary | ICD-10-CM

## 2020-01-21 DIAGNOSIS — K219 Gastro-esophageal reflux disease without esophagitis: Secondary | ICD-10-CM

## 2020-02-05 ENCOUNTER — Other Ambulatory Visit: Payer: Self-pay | Admitting: Family

## 2020-02-05 DIAGNOSIS — E785 Hyperlipidemia, unspecified: Secondary | ICD-10-CM

## 2020-02-05 DIAGNOSIS — I739 Peripheral vascular disease, unspecified: Secondary | ICD-10-CM

## 2020-02-05 DIAGNOSIS — I2510 Atherosclerotic heart disease of native coronary artery without angina pectoris: Secondary | ICD-10-CM

## 2020-02-05 DIAGNOSIS — K219 Gastro-esophageal reflux disease without esophagitis: Secondary | ICD-10-CM

## 2020-02-05 DIAGNOSIS — Z8673 Personal history of transient ischemic attack (TIA), and cerebral infarction without residual deficits: Secondary | ICD-10-CM

## 2020-02-06 NOTE — Telephone Encounter (Signed)
[place]. NTBS 30 days given 01/21/20 

## 2020-02-08 DIAGNOSIS — R062 Wheezing: Secondary | ICD-10-CM | POA: Diagnosis not present

## 2020-02-08 DIAGNOSIS — R0600 Dyspnea, unspecified: Secondary | ICD-10-CM | POA: Diagnosis not present

## 2020-02-08 DIAGNOSIS — J439 Emphysema, unspecified: Secondary | ICD-10-CM | POA: Diagnosis not present

## 2020-02-10 ENCOUNTER — Ambulatory Visit: Payer: Medicare HMO | Admitting: Licensed Clinical Social Worker

## 2020-02-10 ENCOUNTER — Other Ambulatory Visit: Payer: Self-pay | Admitting: Family

## 2020-02-10 DIAGNOSIS — F332 Major depressive disorder, recurrent severe without psychotic features: Secondary | ICD-10-CM

## 2020-02-10 DIAGNOSIS — I2510 Atherosclerotic heart disease of native coronary artery without angina pectoris: Secondary | ICD-10-CM

## 2020-02-10 DIAGNOSIS — K219 Gastro-esophageal reflux disease without esophagitis: Secondary | ICD-10-CM

## 2020-02-10 DIAGNOSIS — Z86718 Personal history of other venous thrombosis and embolism: Secondary | ICD-10-CM

## 2020-02-10 DIAGNOSIS — Z8673 Personal history of transient ischemic attack (TIA), and cerebral infarction without residual deficits: Secondary | ICD-10-CM

## 2020-02-10 DIAGNOSIS — J439 Emphysema, unspecified: Secondary | ICD-10-CM

## 2020-02-10 NOTE — Chronic Care Management (AMB) (Signed)
Chronic Care Management    Clinical Social Work Follow Up Note  02/10/2020 Name: Savvy Peeters MRN: [id number] DOB: 1960-11-23  Kynlei Piontek is a 60 y.o. year old female who is a primary care patient of [name], FNP. The CCM team was consulted for assistance with [facility] .   Review of patient status, including review of consultants reports, other relevant assessments, and collaboration with appropriate care team members and the patient's provider was performed as part of comprehensive patient evaluation and provision of chronic care management services.    SDOH (Social Determinants of Health) assessments performed: No; risk for tobacco use; risk for depression; risk for physical inactivity; risk for stress  Flowsheet Row Office Visit from 02/17/2019 in [facility]  PHQ-9 Total Score 15      GAD 7 : Generalized Anxiety Score 02/17/2019 10/30/2018  Nervous, Anxious, on Edge 0 1  Control/stop worrying 0 1  Worry too much - different things 0 1  Trouble relaxing 1 1  Restless 0 1  Easily annoyed or irritable 2 0  Afraid - awful might happen 2 0  Total GAD 7 Score 5 5  Anxiety Difficulty Not difficult at all Somewhat difficult    Outpatient Encounter Medications as of 02/10/2020  Medication Sig  . albuterol (VENTOLIN HFA) 108 (90 Base) MCG/ACT inhaler Inhale 1-2 puffs into the lungs every 6 (six) hours as needed for wheezing or shortness of breath.  [name] aspirin EC 81 MG EC tablet Take 1 tablet (81 mg total) by mouth daily.  . Calcium Carbonate-Vitamin D 600-400 MG-UNIT tablet Take by mouth.  . carvedilol (COREG) 12.5 MG tablet Take 1 tablet (12.5 mg total) by mouth 2 (two) times daily with a meal.  . carvedilol (COREG) 6.25 MG tablet   . gabapentin (NEURONTIN) 300 MG capsule   . haloperidol (HALDOL) 2 MG tablet   . nicotine (NICODERM CQ - DOSED IN MG/24 HOURS) 14 mg/24hr patch Place 1 patch (14 mg total) onto the skin daily.  . nitroGLYCERIN  (NITROSTAT) 0.4 MG SL tablet Place 1 tablet (0.4 mg total) under the tongue every 5 (five) minutes as needed for chest pain.  [name] omega-3 acid ethyl esters (LOVAZA) 1 g capsule Take 1 capsule (1 g total) by mouth 2 (two) times daily.  . pantoprazole (PROTONIX) 40 MG tablet Take 1 tablet (40 mg total) by mouth daily. (Needs to be seen before next refill)  . ranolazine (RANEXA) 500 MG 12 hr tablet Take 500 mg by mouth 2 (two) times daily.  . rivaroxaban (XARELTO) 20 MG TABS tablet Take 1 tablet (20 mg total) by mouth daily.  . rosuvastatin (CRESTOR) 40 MG tablet Take 1 tablet (40 mg total) by mouth daily. (Needs to be seen before next refill)  . SPIRIVA HANDIHALER 18 MCG inhalation capsule PLACE 1 CAPSULE (18 MCG TOTAL) INTO INHALER AND INHALE AT BEDTIME.  [name] topiramate (TOPAMAX) 100 MG tablet Take 100 mg by mouth daily.  . traZODone (DESYREL) 150 MG tablet USE FROM 1/3 TO 1 TABLET NIGHTLY AS NEEDED FOR SLEEP.  [name] valACYclovir (VALTREX) 500 MG tablet TAKE 1 TABLET BY MOUTH EVERY DAY  . venlafaxine XR (EFFEXOR-XR) 75 MG 24 hr capsule Take 1 capsule (75 mg total) by mouth daily with breakfast.   No facility-administered encounter medications on file as of 02/10/2020.    LCSW called client home phone number today but LCSW was not able to speak via phone with client today. However, LCSW did leave phone  message for [facility] requesting that she please return call to LCSW at [phone]  Follow Up Plan: LCSW to call client in next 4 weeks to talk with her about community resources of assistance to client  [name].Keon Pender MSW, LCSW Licensed Clinical Social Worker [facility] Care [facility] [phone]

## 2020-02-10 NOTE — Patient Instructions (Addendum)
Licensed Clinical Social Worker Visit Information  Materials Provided: No  02/10/2020  Name: Lindsay Bradford         MRN: [id number]       DOB: 1961-01-09  Lindsay Bradford is a 60 y.o. year old female who is a primary care patient of Lindsay Spencer, FNP. The CCM team was consulted for assistance with [facility] .   Review of patient status, including review of consultants reports, other relevant assessments, and collaboration with appropriate care team members and the patient's provider was performed as part of comprehensive patient evaluation and provision of chronic care management services.    SDOH (Social Determinants of Health) assessments performed: No; risk for tobacco use; risk for depression; risk for physical inactivity; risk for stress  LCSW called client home phone number today but LCSW was not able to speak via phone with client today. However, LCSW did leave phone message for [facility] requesting that she please return call to LCSW at [phone]  Follow Up Plan:LCSW to call client in next 4 weeks to talk with her about community resources of assistance to client  LCSW was not able to speak via phone with client today; thus the client was not able to verbalize understanding of instructions provided today and was not able to accept or decline a print copy of patient instruction materials.   Lindsay Bradford.Lindsay Bradford MSW, LCSW Licensed Clinical Social Worker [facility] Care Management [phone]

## 2020-02-23 DIAGNOSIS — R55 Syncope and collapse: Secondary | ICD-10-CM | POA: Diagnosis not present

## 2020-02-24 DIAGNOSIS — R55 Syncope and collapse: Secondary | ICD-10-CM | POA: Diagnosis not present

## 2020-02-25 DIAGNOSIS — R55 Syncope and collapse: Secondary | ICD-10-CM | POA: Diagnosis not present

## 2020-02-27 DIAGNOSIS — Z1159 Encounter for screening for other viral diseases: Secondary | ICD-10-CM | POA: Diagnosis not present

## 2020-03-06 ENCOUNTER — Other Ambulatory Visit: Payer: Self-pay | Admitting: Family

## 2020-03-06 DIAGNOSIS — K219 Gastro-esophageal reflux disease without esophagitis: Secondary | ICD-10-CM

## 2020-03-08 DIAGNOSIS — I255 Ischemic cardiomyopathy: Secondary | ICD-10-CM | POA: Diagnosis not present

## 2020-03-08 DIAGNOSIS — Z955 Presence of coronary angioplasty implant and graft: Secondary | ICD-10-CM | POA: Diagnosis not present

## 2020-03-08 DIAGNOSIS — Z8673 Personal history of transient ischemic attack (TIA), and cerebral infarction without residual deficits: Secondary | ICD-10-CM | POA: Diagnosis not present

## 2020-03-08 DIAGNOSIS — I2510 Atherosclerotic heart disease of native coronary artery without angina pectoris: Secondary | ICD-10-CM | POA: Diagnosis not present

## 2020-03-08 DIAGNOSIS — I252 Old myocardial infarction: Secondary | ICD-10-CM | POA: Diagnosis not present

## 2020-03-08 DIAGNOSIS — D6851 Activated protein C resistance: Secondary | ICD-10-CM | POA: Diagnosis not present

## 2020-03-08 NOTE — Telephone Encounter (Signed)
[place]. NTBS 30 days given 01/21/20 

## 2020-03-10 DIAGNOSIS — R0600 Dyspnea, unspecified: Secondary | ICD-10-CM | POA: Diagnosis not present

## 2020-03-10 DIAGNOSIS — R062 Wheezing: Secondary | ICD-10-CM | POA: Diagnosis not present

## 2020-03-10 DIAGNOSIS — J439 Emphysema, unspecified: Secondary | ICD-10-CM | POA: Diagnosis not present

## 2020-03-15 ENCOUNTER — Telehealth: Payer: Medicare HMO

## 2020-03-25 ENCOUNTER — Encounter: Payer: Self-pay | Admitting: Family

## 2020-03-25 ENCOUNTER — Ambulatory Visit (INDEPENDENT_AMBULATORY_CARE_PROVIDER_SITE_OTHER): Payer: Medicare HMO | Admitting: Family

## 2020-03-25 ENCOUNTER — Other Ambulatory Visit: Payer: Self-pay

## 2020-03-25 ENCOUNTER — Inpatient Hospital Stay (HOSPITAL_COMMUNITY)
Admission: EM | Admit: 2020-03-25 | Discharge: 2020-03-27 | DRG: 190 | Disposition: A | Discharge status: Home / Self Care | Payer: Medicare HMO | Attending: Internal Medicine | Admitting: Internal Medicine

## 2020-03-25 ENCOUNTER — Emergency Department (HOSPITAL_COMMUNITY): Payer: Medicare HMO

## 2020-03-25 VITALS — BP 93/64 | HR 82 | Temp 97.3°F | Ht 68.0 in | Wt 219.6 lb

## 2020-03-25 DIAGNOSIS — Z87892 Personal history of anaphylaxis: Secondary | ICD-10-CM | POA: Diagnosis not present

## 2020-03-25 DIAGNOSIS — Z743 Need for continuous supervision: Secondary | ICD-10-CM | POA: Diagnosis not present

## 2020-03-25 DIAGNOSIS — M1990 Unspecified osteoarthritis, unspecified site: Secondary | ICD-10-CM | POA: Diagnosis present

## 2020-03-25 DIAGNOSIS — Z825 Family history of asthma and other chronic lower respiratory diseases: Secondary | ICD-10-CM

## 2020-03-25 DIAGNOSIS — Z79899 Other long term (current) drug therapy: Secondary | ICD-10-CM

## 2020-03-25 DIAGNOSIS — J439 Emphysema, unspecified: Principal | ICD-10-CM | POA: Diagnosis present

## 2020-03-25 DIAGNOSIS — Z8701 Personal history of pneumonia (recurrent): Secondary | ICD-10-CM

## 2020-03-25 DIAGNOSIS — I2510 Atherosclerotic heart disease of native coronary artery without angina pectoris: Secondary | ICD-10-CM | POA: Diagnosis not present

## 2020-03-25 DIAGNOSIS — Z955 Presence of coronary angioplasty implant and graft: Secondary | ICD-10-CM

## 2020-03-25 DIAGNOSIS — D6851 Activated protein C resistance: Secondary | ICD-10-CM | POA: Diagnosis not present

## 2020-03-25 DIAGNOSIS — Z9861 Coronary angioplasty status: Secondary | ICD-10-CM

## 2020-03-25 DIAGNOSIS — Z885 Allergy status to narcotic agent status: Secondary | ICD-10-CM

## 2020-03-25 DIAGNOSIS — I959 Hypotension, unspecified: Secondary | ICD-10-CM

## 2020-03-25 DIAGNOSIS — F17200 Nicotine dependence, unspecified, uncomplicated: Secondary | ICD-10-CM

## 2020-03-25 DIAGNOSIS — F419 Anxiety disorder, unspecified: Secondary | ICD-10-CM | POA: Diagnosis present

## 2020-03-25 DIAGNOSIS — Z8673 Personal history of transient ischemic attack (TIA), and cerebral infarction without residual deficits: Secondary | ICD-10-CM

## 2020-03-25 DIAGNOSIS — R062 Wheezing: Secondary | ICD-10-CM | POA: Diagnosis not present

## 2020-03-25 DIAGNOSIS — F17210 Nicotine dependence, cigarettes, uncomplicated: Secondary | ICD-10-CM | POA: Diagnosis present

## 2020-03-25 DIAGNOSIS — I252 Old myocardial infarction: Secondary | ICD-10-CM | POA: Diagnosis not present

## 2020-03-25 DIAGNOSIS — Z86718 Personal history of other venous thrombosis and embolism: Secondary | ICD-10-CM

## 2020-03-25 DIAGNOSIS — Z7901 Long term (current) use of anticoagulants: Secondary | ICD-10-CM | POA: Diagnosis not present

## 2020-03-25 DIAGNOSIS — I255 Ischemic cardiomyopathy: Secondary | ICD-10-CM | POA: Diagnosis present

## 2020-03-25 DIAGNOSIS — M797 Fibromyalgia: Secondary | ICD-10-CM | POA: Diagnosis not present

## 2020-03-25 DIAGNOSIS — Z881 Allergy status to other antibiotic agents status: Secondary | ICD-10-CM

## 2020-03-25 DIAGNOSIS — Z20822 Contact with and (suspected) exposure to covid-19: Secondary | ICD-10-CM | POA: Diagnosis present

## 2020-03-25 DIAGNOSIS — Z823 Family history of stroke: Secondary | ICD-10-CM

## 2020-03-25 DIAGNOSIS — I5022 Chronic systolic (congestive) heart failure: Secondary | ICD-10-CM | POA: Diagnosis not present

## 2020-03-25 DIAGNOSIS — Z880 Allergy status to penicillin: Secondary | ICD-10-CM

## 2020-03-25 DIAGNOSIS — K219 Gastro-esophageal reflux disease without esophagitis: Secondary | ICD-10-CM | POA: Diagnosis present

## 2020-03-25 DIAGNOSIS — E785 Hyperlipidemia, unspecified: Secondary | ICD-10-CM | POA: Diagnosis not present

## 2020-03-25 DIAGNOSIS — F32A Depression, unspecified: Secondary | ICD-10-CM | POA: Diagnosis present

## 2020-03-25 DIAGNOSIS — Z7982 Long term (current) use of aspirin: Secondary | ICD-10-CM

## 2020-03-25 DIAGNOSIS — R0602 Shortness of breath: Secondary | ICD-10-CM | POA: Diagnosis not present

## 2020-03-25 DIAGNOSIS — Z91041 Radiographic dye allergy status: Secondary | ICD-10-CM

## 2020-03-25 DIAGNOSIS — Z888 Allergy status to other drugs, medicaments and biological substances status: Secondary | ICD-10-CM

## 2020-03-25 DIAGNOSIS — J9601 Acute respiratory failure with hypoxia: Secondary | ICD-10-CM | POA: Diagnosis present

## 2020-03-25 DIAGNOSIS — R0902 Hypoxemia: Secondary | ICD-10-CM

## 2020-03-25 DIAGNOSIS — Z882 Allergy status to sulfonamides status: Secondary | ICD-10-CM

## 2020-03-25 DIAGNOSIS — Z8261 Family history of arthritis: Secondary | ICD-10-CM

## 2020-03-25 DIAGNOSIS — J441 Chronic obstructive pulmonary disease with (acute) exacerbation: Secondary | ICD-10-CM | POA: Diagnosis not present

## 2020-03-25 DIAGNOSIS — R7981 Abnormal blood-gas level: Secondary | ICD-10-CM

## 2020-03-25 DIAGNOSIS — Z7951 Long term (current) use of inhaled steroids: Secondary | ICD-10-CM

## 2020-03-25 DIAGNOSIS — R0600 Dyspnea, unspecified: Secondary | ICD-10-CM | POA: Diagnosis not present

## 2020-03-25 LAB — CBC
HCT: 44.8 % (ref 36.0–46.0)
Hemoglobin: 14.6 g/dL (ref 12.0–15.0)
MCH: 31.7 pg (ref 26.0–34.0)
MCHC: 32.6 g/dL (ref 30.0–36.0)
MCV: 97.4 fL (ref 80.0–100.0)
Platelets: 374 10*3/uL (ref 150–400)
RBC: 4.6 MIL/uL (ref 3.87–5.11)
RDW: 14.3 % (ref 11.5–15.5)
WBC: 8.8 10*3/uL (ref 4.0–10.5)
nRBC: 0 % (ref 0.0–0.2)

## 2020-03-25 LAB — BASIC METABOLIC PANEL
Anion gap: 9 (ref 5–15)
BUN: 8 mg/dL (ref 6–20)
CO2: 25 mmol/L (ref 22–32)
Calcium: 8.6 mg/dL — ABNORMAL LOW (ref 8.9–10.3)
Chloride: 103 mmol/L (ref 98–111)
Creatinine, Ser: 0.79 mg/dL (ref 0.44–1.00)
GFR, Estimated: >60 mL/min (ref >60)
Glucose, Bld: 89 mg/dL (ref 70–99)
Potassium: 3.9 mmol/L (ref 3.5–5.1)
Sodium: 137 mmol/L (ref 135–145)

## 2020-03-25 LAB — PROTIME-INR
INR: 2.2 — ABNORMAL HIGH (ref 0.8–1.2)
Prothrombin Time: 24 seconds — ABNORMAL HIGH (ref 11.4–15.2)

## 2020-03-25 LAB — RESP PANEL BY RT-PCR (FLU A&B, COVID) ARPGX2
Influenza A by PCR: NEGATIVE
Influenza B by PCR: NEGATIVE
SARS Coronavirus 2 by RT PCR: NEGATIVE

## 2020-03-25 LAB — LACTIC ACID, PLASMA
Lactic Acid, Venous: 0.8 mmol/L (ref 0.5–1.9)
Lactic Acid, Venous: 1.2 mmol/L (ref 0.5–1.9)

## 2020-03-25 LAB — TROPONIN I (HIGH SENSITIVITY)
Troponin I (High Sensitivity): 4 ng/L (ref <18)
Troponin I (High Sensitivity): 3 ng/L (ref <18)

## 2020-03-25 MED ORDER — POLYETHYLENE GLYCOL 3350 17 G PO PACK: 17.0000 g | PACK | Freq: Every day | ORAL | Status: DC | PRN

## 2020-03-25 MED ORDER — RIVAROXABAN 20 MG PO TABS
20.0000 mg | ORAL_TABLET | Freq: Every day | ORAL | Status: DC
  Administered 2020-03-26 – 2020-03-27 (×2): 20 mg via ORAL
  Filled 2020-03-25 (×2): qty 1

## 2020-03-25 MED ORDER — ASPIRIN EC 81 MG PO TBEC
81.0000 mg | DELAYED_RELEASE_TABLET | Freq: Every day | ORAL | Status: DC
  Administered 2020-03-26 – 2020-03-27 (×2): 81 mg via ORAL
  Filled 2020-03-25 (×2): qty 1

## 2020-03-25 MED ORDER — METHYLPREDNISOLONE SODIUM SUCC 125 MG IJ SOLR
60.0000 mg | Freq: Two times a day (BID) | INTRAMUSCULAR | Status: DC
  Administered 2020-03-26 – 2020-03-27 (×3): 60 mg via INTRAVENOUS
  Filled 2020-03-25 (×3): qty 2

## 2020-03-25 MED ORDER — CARVEDILOL 12.5 MG PO TABS
12.5000 mg | ORAL_TABLET | Freq: Two times a day (BID) | ORAL | Status: DC
  Administered 2020-03-26 – 2020-03-27 (×3): 12.5 mg via ORAL
  Filled 2020-03-25 (×3): qty 1

## 2020-03-25 MED ORDER — LOSARTAN POTASSIUM 50 MG PO TABS
25.0000 mg | ORAL_TABLET | Freq: Every day | ORAL | Status: DC
  Filled 2020-03-25: qty 1

## 2020-03-25 MED ORDER — GUAIFENESIN-DM 100-10 MG/5ML PO SYRP
10.0000 mL | ORAL_SOLUTION | Freq: Three times a day (TID) | ORAL | Status: AC
  Administered 2020-03-25 – 2020-03-27 (×5): 10 mL via ORAL
  Filled 2020-03-25 (×5): qty 10

## 2020-03-25 MED ORDER — SODIUM CHLORIDE 0.9 % IV BOLUS: 1000.0000 mL | Freq: Once | INTRAVENOUS | Status: DC

## 2020-03-25 MED ORDER — RANOLAZINE ER 500 MG PO TB12
500.0000 mg | ORAL_TABLET | Freq: Two times a day (BID) | ORAL | Status: DC
  Administered 2020-03-25 – 2020-03-27 (×4): 500 mg via ORAL
  Filled 2020-03-25 (×4): qty 1

## 2020-03-25 MED ORDER — IPRATROPIUM-ALBUTEROL 0.5-2.5 (3) MG/3ML IN SOLN: 3.0000 mL | RESPIRATORY_TRACT | Status: DC | PRN

## 2020-03-25 MED ORDER — ONDANSETRON HCL 4 MG PO TABS: 4.0000 mg | ORAL_TABLET | Freq: Four times a day (QID) | ORAL | Status: DC | PRN

## 2020-03-25 MED ORDER — PANTOPRAZOLE SODIUM 40 MG PO TBEC
40.0000 mg | DELAYED_RELEASE_TABLET | Freq: Every day | ORAL | Status: DC
  Administered 2020-03-26 – 2020-03-27 (×2): 40 mg via ORAL
  Filled 2020-03-25 (×2): qty 1

## 2020-03-25 MED ORDER — IPRATROPIUM-ALBUTEROL 0.5-2.5 (3) MG/3ML IN SOLN
3.0000 mL | Freq: Four times a day (QID) | RESPIRATORY_TRACT | Status: DC
  Administered 2020-03-25: 3 mL via RESPIRATORY_TRACT
  Filled 2020-03-25: qty 3

## 2020-03-25 MED ORDER — VALACYCLOVIR HCL 500 MG PO TABS
500.0000 mg | ORAL_TABLET | Freq: Every day | ORAL | Status: DC
  Administered 2020-03-26 – 2020-03-27 (×2): 500 mg via ORAL
  Filled 2020-03-25 (×2): qty 1

## 2020-03-25 MED ORDER — METHYLPREDNISOLONE SODIUM SUCC 125 MG IJ SOLR
125.0000 mg | Freq: Once | INTRAMUSCULAR | Status: AC
  Administered 2020-03-25: 125 mg via INTRAVENOUS
  Filled 2020-03-25: qty 2

## 2020-03-25 MED ORDER — ROSUVASTATIN CALCIUM 20 MG PO TABS
40.0000 mg | ORAL_TABLET | Freq: Every day | ORAL | Status: DC
  Administered 2020-03-26 – 2020-03-27 (×2): 40 mg via ORAL
  Filled 2020-03-25 (×2): qty 2

## 2020-03-25 MED ORDER — ACETAMINOPHEN 650 MG RE SUPP: 650.0000 mg | Freq: Four times a day (QID) | RECTAL | Status: DC | PRN

## 2020-03-25 MED ORDER — ONDANSETRON HCL 4 MG/2ML IJ SOLN: 4.0000 mg | Freq: Four times a day (QID) | INTRAMUSCULAR | Status: DC | PRN

## 2020-03-25 MED ORDER — ACETAMINOPHEN 325 MG PO TABS: 650.0000 mg | ORAL_TABLET | Freq: Four times a day (QID) | ORAL | Status: DC | PRN

## 2020-03-25 MED ORDER — TRAZODONE HCL 50 MG PO TABS
150.0000 mg | ORAL_TABLET | Freq: Every day | ORAL | Status: DC
Start: 2020-03-25 — End: 2020-03-27
  Administered 2020-03-25 – 2020-03-26 (×2): 150 mg via ORAL
  Filled 2020-03-25 (×2): qty 3

## 2020-03-25 MED ORDER — LEVOFLOXACIN IN D5W 750 MG/150ML IV SOLN
750.0000 mg | Freq: Once | INTRAVENOUS | Status: AC
  Administered 2020-03-25: 750 mg via INTRAVENOUS
  Filled 2020-03-25: qty 150

## 2020-03-25 MED ORDER — SODIUM CHLORIDE 0.9 % IV BOLUS
500.0000 mL | Freq: Once | INTRAVENOUS | Status: AC
  Administered 2020-03-25: 500 mL via INTRAVENOUS

## 2020-03-25 MED ORDER — IPRATROPIUM-ALBUTEROL 0.5-2.5 (3) MG/3ML IN SOLN
3.0000 mL | Freq: Three times a day (TID) | RESPIRATORY_TRACT | Status: DC
  Administered 2020-03-26 – 2020-03-27 (×5): 3 mL via RESPIRATORY_TRACT
  Filled 2020-03-25 (×5): qty 3

## 2020-03-25 MED ORDER — VENLAFAXINE HCL ER 75 MG PO CP24
75.0000 mg | ORAL_CAPSULE | Freq: Every day | ORAL | Status: DC
  Administered 2020-03-26 – 2020-03-27 (×2): 75 mg via ORAL
  Filled 2020-03-25 (×2): qty 1

## 2020-03-25 NOTE — ED Notes (Signed)
ED TO INPATIENT HANDOFF REPORT  ED Nurse Name and Phone #:   S Name/Age/Gender Lindsay Bradford 60 y.o. female Room/Bed: APA08/APA08  Code Status   Code Status: Prior  Home/SNF/Other Home Patient oriented to: self, place, time and situation Is this baseline? Yes   Triage Complete: Triage complete  Chief Complaint COPD with acute exacerbation (HCC) [J44.1]  Triage Note Pt. Arrived via EMS from the doctor office with complaints of shortness of breath. Per EMS Pts. O2 was 88 on RA. Pt. States they have been short of breath for 6 months now.    Allergies Allergies  Allergen Reactions   Cephalexin Hives   Clarithromycin Hives   Ibuprofen Hives   Other Itching and Other (See Comments)    Whole milk and oranges   Oxymorphone Palpitations   Penicillins Anaphylaxis    Did it involve swelling of the face/tongue/throat, SOB, or low BP? Yes Did it involve sudden or severe rash/hives, skin peeling, or any reaction on the inside of your mouth or nose? Yes Did you need to seek medical attention at a hospital or doctor's office? Yes When did it last happen?childhood allergy If all above answers are NO, may proceed with cephalosporin use.    Potassium Chloride Hives    Reacts to oral KCl, tolerates it slow IV    Sulfa Antibiotics Anaphylaxis   Clindamycin Hives and Itching   Fentanyl Hives and Other (See Comments)    Fentanyl patch, it breaks pt out in hives and lowers her blood pressure.    Naproxen Hives   Iodinated Diagnostic Agents Hives   Lyrica [Pregabalin]     Chest pain   Tetracycline Hives and Itching    Level of Care/Admitting Diagnosis ED Disposition    ED Disposition Condition Comment   Admit  Hospital Area: [facility] [[id number]]  Level of Care: Telemetry [5]  Covid Evaluation: Confirmed COVID Negative  Diagnosis: COPD with acute exacerbation [facility]) [[id number]]  Admitting Physician: Onnie Boer [phone]  Attending Physician:  Onnie Boer [email or web address]       B Medical/Surgery History Past Medical History:  Diagnosis Date   Arthritis    Asthma    Bursitis of left hip    COPD (chronic obstructive pulmonary disease) (HCC)    Coronary artery disease    Depression    DVT (deep venous thrombosis) (HCC)    left   Factor V Leiden (HCC)    Fibromyalgia    GERD (gastroesophageal reflux disease)    Headache    sesaonal   Herpes simplex type 2 infection    History of blood transfusion    Hyperlipidemia    Hypotension    90's, Normally; in pain normal; BP   Ischemic cardiomyopathy    MI (myocardial infarction) (HCC)    x 2 2000    Pneumonia    Stroke (HCC) 02/27/2018   no residual   Past Surgical History:  Procedure Laterality Date   ABDOMINAL HYSTERECTOMY     APPENDECTOMY     CARDIAC CATHETERIZATION     COLONOSCOPY     FOOT SURGERY Bilateral    LOOP RECORDER IMPLANT  06/20/2018   [facility]    ORIF ANKLE FRACTURE Right 10/18/2018   Procedure: OPEN REDUCTION INTERNAL FIXATION (ORIF) ANKLE FRACTURE;  Surgeon: Roby Lofts, MD;  Location: [facility] OR;  Service: Orthopedics;  Laterality: Right;   SHOULDER SURGERY Right    VEIN BYPASS SURGERY Left      A IV Location/Drains/Wounds Patient Lines/Drains/Airways Status  Active Line/Drains/Airways    Name Placement date Placement time Site Days   Peripheral IV 03/25/20 Left Forearm 03/25/20  1930  Forearm  less than 1          Intake/Output Last 24 hours  Intake/Output Summary (Last 24 hours) at 03/25/2020 2152 Last data filed at 03/25/2020 2142 Gross per 24 hour  Intake 500 ml  Output --  Net 500 ml    Labs/Imaging Results for orders placed or performed during the hospital encounter of 03/25/20 (from the past 48 hour(s))  Basic metabolic panel     Status: Abnormal   Collection Time: 03/25/20  5:47 PM  Result Value Ref Range   Sodium 137 135 - 145 mmol/L   Potassium 3.9 3.5 - 5.1 mmol/L   Chloride 103 98 - 111  mmol/L   CO2 25 22 - 32 mmol/L   Glucose, Bld 89 70 - 99 mg/dL    Comment: Glucose reference range applies only to samples taken after fasting for at least 8 hours.   BUN 8 6 - 20 mg/dL   Creatinine, Ser 6.210.79 0.44 - 1.00 mg/dL   Calcium 8.6 (L) 8.9 - 10.3 mg/dL   GFR, Estimated >30>60 >86>60 mL/min    Comment: (NOTE) Calculated using the CKD-EPI Creatinine Equation (2021)    Anion gap 9 5 - 15    Comment: Performed at [facility], [address] [place], [place] [address]  CBC     Status: None   Collection Time: 03/25/20  5:47 PM  Result Value Ref Range   WBC 8.8 4.0 - 10.5 K/uL   RBC 4.60 3.87 - 5.11 MIL/uL   Hemoglobin 14.6 12.0 - 15.0 g/dL   HCT 96.244.8 95.236.0 - 84.146.0 %   MCV 97.4 80.0 - 100.0 fL   MCH 31.7 26.0 - 34.0 pg   MCHC 32.6 30.0 - 36.0 g/dL   RDW 32.414.3 40.111.5 - 02.715.5 %   Platelets 374 150 - 400 K/uL   nRBC 0.0 0.0 - 0.2 %    Comment: Performed at [facility], [address] [place], [place] [address]  Resp Panel by RT-PCR (Flu A&B, Covid) Nasopharyngeal Swab     Status: None   Collection Time: 03/25/20  6:56 PM   Specimen: Nasopharyngeal Swab; Nasopharyngeal(NP) swabs in vial transport medium  Result Value Ref Range   SARS Coronavirus 2 by RT PCR NEGATIVE NEGATIVE    Comment: (NOTE) SARS-CoV-2 target nucleic acids are NOT DETECTED.  The SARS-CoV-2 RNA is generally detectable in upper respiratory specimens during the acute phase of infection. The lowest concentration of SARS-CoV-2 viral copies this assay can detect is 138 copies/mL. A negative result does not preclude SARS-Cov-2 infection and should not be used as the sole basis for treatment or other patient management decisions. A negative result may occur with  improper specimen collection/handling, submission of specimen other than nasopharyngeal swab, presence of viral mutation(s) within the areas targeted by this assay, and inadequate number of viral copies(<138 copies/mL). A negative result must be combined  with clinical observations, patient history, and epidemiological information. The expected result is Negative.  Fact Sheet for Patients:  BloggerCourse.comhttps://www.fda.gov/media/152166/download  Fact Sheet for Healthcare Providers:  SeriousBroker.ithttps://www.fda.gov/media/152162/download  This test is no t yet approved or cleared by the [place] FDA and  has been authorized for detection and/or diagnosis of SARS-CoV-2 by FDA under an Emergency Use Authorization (EUA). This EUA will remain  in effect (meaning this test can be used) for the duration of the  COVID-19 declaration under Section 564(b)(1) of the Act, 21 U.S.C.section 360bbb-3(b)(1), unless the authorization is terminated  or revoked sooner.       Influenza A by PCR NEGATIVE NEGATIVE   Influenza B by PCR NEGATIVE NEGATIVE    Comment: (NOTE) The Xpert Xpress SARS-CoV-2/FLU/RSV plus assay is intended as an aid in the diagnosis of influenza from Nasopharyngeal swab specimens and should not be used as a sole basis for treatment. Nasal washings and aspirates are unacceptable for Xpert Xpress SARS-CoV-2/FLU/RSV testing.  Fact Sheet for Patients: BloggerCourse.com  Fact Sheet for Healthcare Providers: SeriousBroker.it  This test is not yet approved or cleared by the [place] FDA and has been authorized for detection and/or diagnosis of SARS-CoV-2 by FDA under an Emergency Use Authorization (EUA). This EUA will remain in effect (meaning this test can be used) for the duration of the COVID-19 declaration under Section 564(b)(1) of the Act, 21 U.S.C. section 360bbb-3(b)(1), unless the authorization is terminated or revoked.  Performed at [facility], [address], [place], [place] [address]   Troponin I (High Sensitivity)     Status: None   Collection Time: 03/25/20  7:18 PM  Result Value Ref Range   Troponin I (High Sensitivity) 4 <18 ng/L    Comment: (NOTE) Elevated high sensitivity  troponin I (hsTnI) values and significant  changes across serial measurements may suggest ACS but many other  chronic and acute conditions are known to elevate hsTnI results.  Refer to the "Links" section for chest pain algorithms and additional  guidance. Performed at [facility], [address] [place], [place] [address]   Lactic acid, plasma     Status: None   Collection Time: 03/25/20  7:18 PM  Result Value Ref Range   Lactic Acid, Venous 0.8 0.5 - 1.9 mmol/L    Comment: Performed at [facility], [address], [place], [place] [address]  Protime-INR     Status: Abnormal   Collection Time: 03/25/20  7:18 PM  Result Value Ref Range   Prothrombin Time 24.0 (H) 11.4 - 15.2 seconds   INR 2.2 (H) 0.8 - 1.2    Comment: (NOTE) INR goal varies based on device and disease states. Performed at [facility], [address], [place], [place] [address]   Lactic acid, plasma     Status: None   Collection Time: 03/25/20  9:16 PM  Result Value Ref Range   Lactic Acid, Venous 1.2 0.5 - 1.9 mmol/L    Comment: Performed at [facility], [address] [place], [place] [address]   DG Chest 2 View  Result Date: 03/25/2020 CLINICAL DATA:  Shortness of breath. EXAM: CHEST - 2 VIEW COMPARISON:  12/06/2018 FINDINGS: Normal heart size and mediastinal contours. Implanted loop recorder faintly visualized in the left chest wall. Lungs are hyperinflated. Increased bronchial and interstitial thickening from prior exam. No confluent airspace disease. No large pleural effusion. No pneumothorax. Stable calcified granuloma at the right lung base. No acute osseous abnormalities are seen. IMPRESSION: Increased bronchial and interstitial thickening from prior exam, suspicious for acute bronchitis/COPD exacerbation. Electronically Signed   By: Narda Rutherford M.D.   On: 03/25/2020 18:52    Pending Labs Unresulted Labs (From admission, onward)          Start     Ordered   03/25/20 1859  Blood culture (routine x  2)  BLOOD CULTURE X 2,   STAT      03/25/20 1858   03/25/20 1859  Urinalysis, Routine  w reflex microscopic  ONCE - STAT,   STAT        03/25/20 1858          Vitals/Pain Today's Vitals   03/25/20 1900 03/25/20 2000 03/25/20 2030 03/25/20 2130  BP: 102/72 (!) 82/59 90/71 91/61   Pulse: 69 72 76 70  Resp: 16 13 (!) 21 20  Temp:      TempSrc:      SpO2: 95% 91% 94% 95%  Weight:      Height:      PainSc:        Isolation Precautions No active isolations  Medications Medications  levofloxacin (LEVAQUIN) IVPB 750 mg (750 mg Intravenous New Bag/Given 03/25/20 2151)  methylPREDNISolone sodium succinate (SOLU-MEDROL) 125 mg/2 mL injection 125 mg (125 mg Intravenous Given 03/25/20 2008)  sodium chloride 0.9 % bolus 500 mL (0 mLs Intravenous Stopped 03/25/20 2142)    Mobility walks with device Low fall risk   Focused Assessments    R Recommendations: See Admitting Provider Note  Report given to:   Additional Notes:

## 2020-03-25 NOTE — ED Provider Notes (Signed)
[facility] EMERGENCY DEPARTMENT Provider Note   CSN: [id number] Arrival date & time: 03/25/20  1716     History Chief Complaint  Patient presents with  . Shortness of Breath    Lindsay Bradford is a 60 y.o. female.  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  She initially was at her primary care office today as she was having some left knee pain but on presentation she was hypoxic, and hypotensive.  and sent here for further evaluation.  She endorses that over the last 6 months she has been feeling more short of breath, and over the last 3 months she has been having increased cough, productive sputum, and feeling more fatigued.  Patient denies associated chest pain, becoming diaphoretic, lightheadedness or dizziness, or pain worsening with exertion worsening pedal edema.  She states that she has been taking her albuterol inhaler more frequently but unfortunately ran out over the last couple days.  She denies recent sick contacts, is not immunocompromise, is up-to-date on her flu and COVID vaccines.  She denies any alleviating factors.  Patient denies headaches, fevers, chills, chest pain, abdominal pain, nausea, vomiting, diarrhea, worsening pedal edema.  Past Medical History:  Diagnosis Date  . Arthritis   . Asthma   . Bursitis of left hip   . COPD (chronic obstructive pulmonary disease) (HCC)   . Coronary artery disease   . Depression   . DVT (deep venous thrombosis) (HCC)    left  . Factor V Leiden (HCC)   . Fibromyalgia   . GERD (gastroesophageal reflux disease)   . Headache    sesaonal  . Herpes simplex type 2 infection   . History of blood transfusion   . Hyperlipidemia   . Hypotension    90's, Normally; in pain normal; BP  . Ischemic cardiomyopathy   . MI (myocardial infarction) (HCC)    x 2 2000   . Pneumonia   . Stroke [facility])  02/27/2018   no residual    Patient Active Problem List   Diagnosis Date Noted  . COPD with acute exacerbation (HCC) 03/25/2020  . Acute respiratory failure with hypoxia (HCC) 03/25/2020  . Gross hematuria 04/23/2019  . Microscopic hematuria 04/23/2019  . Insomnia 02/17/2019  . Opiate abuse, episodic (HCC) 12/17/2018  . Major depressive disorder, recurrent severe without psychotic features (HCC) 12/12/2018  . GERD (gastroesophageal reflux disease) 12/05/2018  . Closed trimalleolar fracture of ankle, right, initial encounter 10/18/2018  . Closed displaced trimalleolar fracture of right lower leg 10/15/2018  . Coronary artery disease involving native coronary artery of native heart 08/26/2018  . Morbid obesity (HCC) 08/26/2018  . NSVT (nonsustained ventricular tachycardia) (HCC) 06/18/2018  . Ischemic cardiomyopathy 03/13/2018  . S/P coronary artery stent placement 03/13/2018  . Dyslipidemia 08/02/2017  . Peripheral arterial disease (HCC) 06/06/2017  . CAD S/P percutaneous coronary angioplasty 08/25/2016  . Spondylolisthesis of lumbosacral region 01/14/2014  . Arthritis 04/05/2012  . History of DVT (deep vein thrombosis) 02/02/2012  . 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 Factor V Leiden (HCC) 03/21/2011  . History of CVA (cerebrovascular accident) 03/21/2011    Past Surgical History:  Procedure Laterality Date  . ABDOMINAL HYSTERECTOMY    . APPENDECTOMY    . CARDIAC CATHETERIZATION    . COLONOSCOPY    . FOOT SURGERY Bilateral   . LOOP RECORDER IMPLANT  06/20/2018   [facility]   .  ORIF ANKLE FRACTURE Right 10/18/2018   Procedure: OPEN REDUCTION INTERNAL FIXATION (ORIF) ANKLE FRACTURE;  Surgeon: Roby Lofts, MD;  Location: [facility] OR;  Service: Orthopedics;  Laterality: Right;  . SHOULDER SURGERY Right   . VEIN BYPASS SURGERY Left      OB History   No obstetric history on file.     Family History  Problem Relation Age of Onset  . Arthritis Mother   . Cancer Mother   .  Colon cancer Mother   . Breast cancer Mother   . COPD Father   . COPD Brother   . Heart disease Sister   . COPD Brother   . Stroke Brother   . Heart disease Brother   . Clotting disorder Brother   . Colon cancer Maternal Grandmother   . Cancer Maternal Grandmother   . Breast cancer Maternal Grandmother   . Cancer Paternal Grandmother   . Colon cancer Paternal Grandmother   . Cancer Nephew   . Colon cancer Nephew     Social History   Tobacco Use  . Smoking status: Heavy Tobacco Smoker    Packs/day: 0.50    Years: 35.00    Pack years: 17.50    Types: Cigarettes  . Smokeless tobacco: Never Used  Vaping Use  . Vaping Use: Some days  . Substances: Flavoring  Substance Use Topics  . Alcohol use: Not Currently  . Drug use: Not Currently    Home Medications Prior to Admission medications   Medication Sig Start Date End Date Taking? Authorizing Provider  albuterol (VENTOLIN HFA) 108 (90 Base) MCG/ACT inhaler Inhale 1-2 puffs into the lungs every 6 (six) hours as needed for wheezing or shortness of breath. 12/17/18  Yes Clapacs, Jackquline Denmark, MD  aspirin EC 81 MG EC tablet Take 1 tablet (81 mg total) by mouth daily. 12/18/18  Yes Clapacs, Jackquline Denmark, MD  Calcium Carbonate-Vitamin D 600-400 MG-UNIT tablet Take by mouth.   Yes [provider]  carvedilol (COREG) 12.5 MG tablet Take 1 tablet (12.5 mg total) by mouth 2 (two) times daily with a meal. 12/17/18  Yes Clapacs, Jackquline Denmark, MD  losartan (COZAAR) 25 MG tablet Take 25 mg by mouth daily.   Yes [provider]  omega-3 acid ethyl esters (LOVAZA) 1 g capsule Take 1 capsule (1 g total) by mouth 2 (two) times daily. 12/17/18  Yes Clapacs, Jackquline Denmark, MD  pantoprazole (PROTONIX) 40 MG tablet Take 1 tablet (40 mg total) by mouth daily. (Needs to be seen before next refill) 01/21/20  Yes Hawks, Christy A, FNP  ranolazine (RANEXA) 500 MG 12 hr tablet Take 500 mg by mouth 2 (two) times daily.   Yes [provider]  rivaroxaban  (XARELTO) 20 MG TABS tablet Take 1 tablet (20 mg total) by mouth daily. 12/18/18  Yes Clapacs, Jackquline Denmark, MD  rosuvastatin (CRESTOR) 40 MG tablet Take 1 tablet (40 mg total) by mouth daily. (Needs to be seen before next refill) 01/21/20  Yes Hawks, Christy A, FNP  traZODone (DESYREL) 150 MG tablet USE FROM 1/3 TO 1 TABLET NIGHTLY AS NEEDED FOR SLEEP. Patient taking differently: Take 150 mg by mouth at bedtime. 11/03/19  Yes Daphine Deutscher, Mary-Margaret, FNP  valACYclovir (VALTREX) 500 MG tablet TAKE 1 TABLET BY MOUTH EVERY DAY 12/25/19  Yes Jannifer Rodney A, FNP  venlafaxine XR (EFFEXOR-XR) 75 MG 24 hr capsule Take 1 capsule (75 mg total) by mouth daily with breakfast. 08/15/19  Yes [name], FNP  gabapentin (  NEURONTIN) 300 MG capsule  01/13/19   [provider]  haloperidol (HALDOL) 2 MG tablet  01/13/19   [provider]  nicotine (NICODERM CQ - DOSED IN MG/24 HOURS) 14 mg/24hr patch Place 1 patch (14 mg total) onto the skin daily. Patient not taking: No sig reported 01/20/19   Jannifer Rodney A, FNP  nitroGLYCERIN (NITROSTAT) 0.4 MG SL tablet Place 1 tablet (0.4 mg total) under the tongue every 5 (five) minutes as needed for chest pain. Patient not taking: No sig reported 12/17/18   Clapacs, Jackquline Denmark, MD  SPIRIVA HANDIHALER 18 MCG inhalation capsule PLACE 1 CAPSULE (18 MCG TOTAL) INTO INHALER AND INHALE AT BEDTIME. Patient not taking: No sig reported 10/13/19   Jannifer Rodney A, FNP  topiramate (TOPAMAX) 100 MG tablet Take 100 mg by mouth daily. Patient not taking: No sig reported    [provider]    Allergies    Cephalexin, Clarithromycin, Ibuprofen, Other, Oxymorphone, Penicillins, Potassium chloride, Sulfa antibiotics, Clindamycin, Fentanyl, Naproxen, Iodinated diagnostic agents, Lyrica [pregabalin], and Tetracycline  Review of Systems   Review of Systems  Constitutional: Positive for fatigue. Negative for chills and fever.  HENT: Negative for congestion and sore throat.    Eyes: Negative for visual disturbance.  Respiratory: Positive for cough and shortness of breath.   Cardiovascular: Negative for chest pain.  Gastrointestinal: Negative for abdominal pain, diarrhea, nausea and vomiting.  Genitourinary: Negative for enuresis and flank pain.  Musculoskeletal: Negative for back pain.  Skin: Negative for rash.  Neurological: Negative for dizziness and headaches.  Hematological: Does not bruise/bleed easily.    Physical Exam Updated Vital Signs BP 91/61   Pulse 70   Temp 99.3 F (37.4 C) (Oral)   Resp 20   Ht 5\' 8"  (1.727 m)   Wt 99.3 kg   SpO2 95%   BMI 33.30 kg/m   Physical Exam Vitals and nursing note reviewed.  Constitutional:      General: She is not in acute distress.    Appearance: She is not ill-appearing.     Comments: Deconditioned state.  HENT:     Head: Normocephalic and atraumatic.     Nose: No congestion.     Mouth/Throat:     Mouth: Mucous membranes are dry.     Pharynx: Oropharynx is clear. No oropharyngeal exudate or posterior oropharyngeal erythema.  Eyes:     Conjunctiva/sclera: Conjunctivae normal.  Cardiovascular:     Rate and Rhythm: Normal rate and regular rhythm.     Pulses: Normal pulses.     Heart sounds: No murmur heard. No friction rub. No gallop.   Pulmonary:     Effort: No respiratory distress.     Breath sounds: No wheezing, rhonchi or rales.     Comments: Patient has a tight sounding chest, bibasilar rales heard, no wheezing or rhonchi present.  She is currently hypoxic on room air satting around 88 to 90%, Chest:     Chest wall: Tenderness present.  Abdominal:     Palpations: Abdomen is soft.     Tenderness: There is no abdominal tenderness.  Musculoskeletal:     Right lower leg: No edema.     Left lower leg: No edema.  Skin:    General: Skin is warm and dry.  Neurological:     Mental Status: She is alert.  Psychiatric:        Mood and Affect: Mood normal.     ED Results / Procedures /  Treatments  Labs (all labs ordered are listed, but only abnormal results are displayed) Labs Reviewed  BASIC METABOLIC PANEL - Abnormal; Notable for the following components:      Result Value   Calcium 8.6 (*)    All other components within normal limits  PROTIME-INR - Abnormal; Notable for the following components:   Prothrombin Time 24.0 (*)    INR 2.2 (*)    All other components within normal limits  RESP PANEL BY RT-PCR (FLU A&B, COVID) ARPGX2  CULTURE, BLOOD (ROUTINE X 2)  CULTURE, BLOOD (ROUTINE X 2)  CBC  LACTIC ACID, PLASMA  LACTIC ACID, PLASMA  URINALYSIS, ROUTINE W REFLEX MICROSCOPIC  TROPONIN I (HIGH SENSITIVITY)  TROPONIN I (HIGH SENSITIVITY)    EKG None  Radiology DG Chest 2 View  Result Date: 03/25/2020 CLINICAL DATA:  Shortness of breath. EXAM: CHEST - 2 VIEW COMPARISON:  12/06/2018 FINDINGS: Normal heart size and mediastinal contours. Implanted loop recorder faintly visualized in the left chest wall. Lungs are hyperinflated. Increased bronchial and interstitial thickening from prior exam. No confluent airspace disease. No large pleural effusion. No pneumothorax. Stable calcified granuloma at the right lung base. No acute osseous abnormalities are seen. IMPRESSION: Increased bronchial and interstitial thickening from prior exam, suspicious for acute bronchitis/COPD exacerbation. Electronically Signed   By: Narda Rutherford M.D.   On: 03/25/2020 18:52    Procedures .Critical Care Performed by: Carroll Sage, PA-C Authorized by: Carroll Sage, PA-C   Critical care provider statement:    Critical care time (minutes):  35   Critical care time was exclusive of:  Separately billable procedures and treating other patients   Critical care was necessary to treat or prevent imminent or life-threatening deterioration of the following conditions:  Respiratory failure   Critical care was time spent personally by me on the following activities:  Discussions  with consultants, evaluation of patient's response to treatment, examination of patient, ordering and performing treatments and interventions, ordering and review of laboratory studies, ordering and review of radiographic studies, pulse oximetry, re-evaluation of patient's condition and review of old charts   I assumed direction of critical care for this patient from another provider in my specialty: no     Care discussed with: admitting provider       Medications Ordered in ED Medications  levofloxacin (LEVAQUIN) IVPB 750 mg (750 mg Intravenous New Bag/Given 03/25/20 2151)  methylPREDNISolone sodium succinate (SOLU-MEDROL) 125 mg/2 mL injection 125 mg (125 mg Intravenous Given 03/25/20 2008)  sodium chloride 0.9 % bolus 500 mL (0 mLs Intravenous Stopped 03/25/20 2142)    ED Course  I have reviewed the triage vital signs and the nursing notes.  Pertinent labs & imaging results that were available during my care of the patient were reviewed by me and considered in my medical decision making (see chart for details).    MDM Rules/Calculators/A&P                         Initial impression-patient presents with shortness of breath.  She is alert, does not appear acute distress, vital signs notable for hypoxia.  Will place patient on 2 L via nasal cannula, obtain sepsis lab work-up, start patient on fluids, provide her with steroids and albuterol and reevaluate.  Work-up-CBC unremarkable, BMP unremarkable.  Initial troponin is 4.  Lactic 1.2.  Respiratory panel negative for Covid and influenza.  Prothrombin time elevated 24, INR 2.2.  EKG started in that time ischemia.  Chest x-ray shows increase bronchial and interstitial thickening from prior exams suspicion for acute bronchitis versus COPD exacerbation.  Reassessment patient is reassessed at provide her with fluids, steroids and albuterol, states she is feeling better.  Has no complaints at this time.  Informed her that I recommend she should be  admitted for further observation due to hypoxia.  Patient was agreeable to this.  Consult suspect patient suffered suffering from acute respiratory failure secondary due to COPD exacerbation.  Will consult with hospitalist for further evaluation.  Spoke with Dr. Hermenia Fiscalempokpea of the hospitalist team, she is except the patient will come down evaluate.  Rule out- I have low suspicion for ACS as history is atypical, patient EKG was sinus rhythm without signs of ischemia, troponin is 4, will defer second opponent as patient has been chest pain-free for over 12 hours, expect elevation as time if ACS is present.  Low suspicion for PE as patient denies pleuritic chest pain, patient denies leg pain, patient is currently on Eliquis, history is more consistent with COPD exacerbation as she endorses SOB for 3 months with increased sputum production.  Low suspicion for AAA or aortic dissection as history is atypical, patient has low risk factors.  Low suspicion for systemic infection as patient is nontoxic-appearing, vital signs reassuring, no obvious source infection noted on exam.  Patient is noted to be hypotensive but after further review patient has generally low blood pressure in the 90s to low 100s, I suspect initially low BP is secondary due to poor intake as patient states she does not eat or drink very often, doubt infectious cause at this time.  Plan-admit patient to hospitalist team for acute respiratory failure secondary due to COPD exacerbation.  This time patient will need further evaluation monitoring.  Patient will be transferred to admitting team.    Final Clinical Impression(s) / ED Diagnoses Final diagnoses:  Hypoxia  COPD exacerbation (HCC)  Hypotension, unspecified hypotension type    Rx / DC Orders ED Discharge Orders    None       Barnie DelFaulkner, Reyhan Moronta J, PA-C 03/25/20 2206    Bethann BerkshireZammit, Joseph, MD 03/28/20 1132

## 2020-03-25 NOTE — Progress Notes (Signed)
Subjective:    Patient ID: Lindsay Bradford, female    DOB: [birth year], 60 y.o.   MRN: [id number]  Chief Complaint  Patient presents with  . Knee Pain    Left knee pain x a few weeks. No injury she is aware of. Had leg surgery a few years ago had blood clot in that knee.  . Asthma    Wants  an emergency inhaler, coughing up fleam x 1 week   Pt presents to the office today for chronic follow up today, but found to be hypotensive and low oxygen saturation. She is a smoker and admits to smoking a pack a day. She has hx of COPD. She reports she has had a productive cough for the last 2 months.  Shortness of Breath This is a new problem. The current episode started more than 1 month ago. Pertinent negatives include no chest pain, ear pain, fever or rhinorrhea. Her past medical history is significant for asthma.      Review of Systems  Constitutional: Negative for fever.  HENT: Negative for ear pain and rhinorrhea.   Respiratory: Positive for shortness of breath.   Cardiovascular: Negative for chest pain.  All other systems reviewed and are negative.      Objective:   Physical Exam Vitals reviewed.  Constitutional:      General: She is not in acute distress.    Appearance: She is well-developed.  HENT:     Head: Normocephalic and atraumatic.  Eyes:     Pupils: Pupils are equal, round, and reactive to light.  Neck:     Thyroid: No thyromegaly.  Cardiovascular:     Rate and Rhythm: Normal rate and regular rhythm.     Heart sounds: Normal heart sounds. No murmur heard.   Pulmonary:     Effort: Pulmonary effort is normal. No respiratory distress.     Breath sounds: Decreased breath sounds present. No wheezing.  Abdominal:     General: Bowel sounds are normal. There is no distension.     Palpations: Abdomen is soft.     Tenderness: There is no abdominal tenderness.  Musculoskeletal:        General: No tenderness. Normal range of motion.     Cervical back: Normal range of  motion and neck supple.  Skin:    General: Skin is warm and dry.  Neurological:     Mental Status: She is alert and oriented to person, place, and time.     Cranial Nerves: No cranial nerve deficit.     Motor: Weakness present.     Deep Tendon Reflexes: Reflexes are normal and symmetric.  Psychiatric:        Behavior: Behavior normal.        Thought Content: Thought content normal.        Judgment: Judgment normal.          BP (!) 80/61   Pulse 77   Temp (!) 97.3 F (36.3 C) (Temporal)   Ht 5\' 8"  (1.727 m)   Wt 219 lb 9.6 oz (99.6 kg)   SpO2 (!) 88%   BMI 33.39 kg/m   Assessment & Plan:  Lindsay Bradford comes in today with chief complaint of Knee Pain (Left knee pain x a few weeks. No injury she is aware of. Had leg surgery a few years ago had blood clot in that knee.) and Asthma (Wants  an emergency inhaler, coughing up fleam x 1 week)   Diagnosis and orders addressed:  1. Hypotension, unspecified hypotension type  2. Low oxygen saturation   3. Current smoker Smoking cessation discussed, continues to smoke a pack a day  4. Morbid obesity (HCC)  5. Pulmonary emphysema, unspecified emphysema type (HCC)   2 L of oxygen applied and EMS called.  Pt transported    Jannifer Rodney, FNP

## 2020-03-25 NOTE — H&P (Addendum)
History and Physical    Jamilee Lafosse WNU:[id number] DOB: 1960-11-07 DOA: 03/25/2020  PCP: [name], FNP   Patient coming from: Home  I have personally briefly reviewed patient's old medical records in [facility]  Chief Complaint: Difficulty breathing  HPI: Lindsay Bradford is a 60 y.o. female with medical history significant for COPD, coronary artery disease, factor V Leyden, DVT, ischemic cardiomyopathy. Presented to the ED with complaints of progressive difficulty breathing over the past 6 months, and also worsening productive cough over the past 2 weeks.  She reports wheezing worse when she sleeps.  She is not on home O2, and she does not use a mask to sleep.  No chest pain.  Reports occasional leg swelling not present today. Ongoing tobacco use, she smokes 1 pack of cigarettes daily.  She denies vomiting, no diarrhea, no dysuria, she reports chronic unchanged poor oral intake.  On presentation to her primary care provider's office, she was found to be hypoxic, she was placed on 2 L and sent to the ED.  ED Course: O2 sats 88 to 90% on room air.  Temperature 99.3.  Blood pressure systolic ranging from 82-102 systolic.  Heart rate 69-76.  BMP, CBC unremarkable.  Covid test negative.  Lactic acid 0.8.  Chest x-ray shows increased bronchial interstitial thickening from prior exam suspicious for acute bronchitis or COPD exacerbation. Solu-Medrol 125 mg, and Levaquin 750x1 given.  Hospitalist to admit.  Review of Systems: As per HPI all other systems reviewed and negative.  Past Medical History:  Diagnosis Date  . Arthritis   . Asthma   . Bursitis of left hip   . COPD (chronic obstructive pulmonary disease) (HCC)   . Coronary artery disease   . Depression   . DVT (deep venous thrombosis) (HCC)    left  . Factor V Leiden (HCC)   . Fibromyalgia   . GERD (gastroesophageal reflux disease)   . Headache    sesaonal  . Herpes simplex type 2 infection   . History of blood  transfusion   . Hyperlipidemia   . Hypotension    90's, Normally; in pain normal; BP  . Ischemic cardiomyopathy   . MI (myocardial infarction) (HCC)    x 2 2000   . Pneumonia   . Stroke (HCC) 02/27/2018   no residual    Past Surgical History:  Procedure Laterality Date  . ABDOMINAL HYSTERECTOMY    . APPENDECTOMY    . CARDIAC CATHETERIZATION    . COLONOSCOPY    . FOOT SURGERY Bilateral   . LOOP RECORDER IMPLANT  06/20/2018   [facility]   . ORIF ANKLE FRACTURE Right 10/18/2018   Procedure: OPEN REDUCTION INTERNAL FIXATION (ORIF) ANKLE FRACTURE;  Surgeon: Roby Lofts, MD;  Location: [facility] OR;  Service: Orthopedics;  Laterality: Right;  . SHOULDER SURGERY Right   . VEIN BYPASS SURGERY Left      reports that she has been smoking cigarettes. She has a 17.50 pack-year smoking history. She has never used smokeless tobacco. She reports previous alcohol use. She reports previous drug use.  Allergies  Allergen Reactions  . Cephalexin Hives  . Clarithromycin Hives  . Ibuprofen Hives  . Other Itching and Other (See Comments)    Whole milk and oranges  . Oxymorphone Palpitations  . Penicillins Anaphylaxis    Did it involve swelling of the face/tongue/throat, SOB, or low BP? Yes Did it involve sudden or severe rash/hives, skin peeling, or any reaction on the inside of  your mouth or nose? Yes Did you need to seek medical attention at a hospital or doctor's office? Yes When did it last happen?childhood allergy If all above answers are "NO", may proceed with cephalosporin use.   [name] Potassium Chloride Hives    Reacts to oral KCl, tolerates it slow IV   . Sulfa Antibiotics Anaphylaxis  . Clindamycin Hives and Itching  . Fentanyl Hives and Other (See Comments)    Fentanyl patch, it breaks pt out in hives and lowers her blood pressure.   . Naproxen Hives  . Iodinated Diagnostic Agents Hives  . Lyrica [Pregabalin]     Chest pain  . Tetracycline Hives and Itching    Family History   Problem Relation Age of Onset  . Arthritis Mother   . Cancer Mother   . Colon cancer Mother   . Breast cancer Mother   . COPD Father   . COPD Brother   . Heart disease Sister   . COPD Brother   . Stroke Brother   . Heart disease Brother   . Clotting disorder Brother   . Colon cancer Maternal Grandmother   . Cancer Maternal Grandmother   . Breast cancer Maternal Grandmother   . Cancer Paternal Grandmother   . Colon cancer Paternal Grandmother   . Cancer Nephew   . Colon cancer Nephew     Prior to Admission medications   Medication Sig Start Date End Date Taking? Authorizing Provider  albuterol (VENTOLIN HFA) 108 (90 Base) MCG/ACT inhaler Inhale 1-2 puffs into the lungs every 6 (six) hours as needed for wheezing or shortness of breath. 12/17/18  Yes Clapacs, Jackquline Denmark, MD  aspirin EC 81 MG EC tablet Take 1 tablet (81 mg total) by mouth daily. 12/18/18  Yes Clapacs, Jackquline Denmark, MD  Calcium Carbonate-Vitamin D 600-400 MG-UNIT tablet Take by mouth.   Yes [provider]  carvedilol (COREG) 12.5 MG tablet Take 1 tablet (12.5 mg total) by mouth 2 (two) times daily with a meal. 12/17/18  Yes Clapacs, Jackquline Denmark, MD  losartan (COZAAR) 25 MG tablet Take 25 mg by mouth daily.   Yes [provider]  omega-3 acid ethyl esters (LOVAZA) 1 g capsule Take 1 capsule (1 g total) by mouth 2 (two) times daily. 12/17/18  Yes Clapacs, Jackquline Denmark, MD  pantoprazole (PROTONIX) 40 MG tablet Take 1 tablet (40 mg total) by mouth daily. (Needs to be seen before next refill) 01/21/20  Yes Hawks, Christy A, FNP  ranolazine (RANEXA) 500 MG 12 hr tablet Take 500 mg by mouth 2 (two) times daily.   Yes [provider]  rivaroxaban (XARELTO) 20 MG TABS tablet Take 1 tablet (20 mg total) by mouth daily. 12/18/18  Yes Clapacs, Jackquline Denmark, MD  rosuvastatin (CRESTOR) 40 MG tablet Take 1 tablet (40 mg total) by mouth daily. (Needs to be seen before next refill) 01/21/20  Yes Hawks, Christy A, FNP  traZODone (DESYREL) 150 MG  tablet USE FROM 1/3 TO 1 TABLET NIGHTLY AS NEEDED FOR SLEEP. Patient taking differently: Take 150 mg by mouth at bedtime. 11/03/19  Yes Daphine Deutscher, Mary-Margaret, FNP  valACYclovir (VALTREX) 500 MG tablet TAKE 1 TABLET BY MOUTH EVERY DAY 12/25/19  Yes Jannifer Rodney A, FNP  venlafaxine XR (EFFEXOR-XR) 75 MG 24 hr capsule Take 1 capsule (75 mg total) by mouth daily with breakfast. 08/15/19  Yes Jannifer Rodney A, FNP  gabapentin (NEURONTIN) 300 MG capsule  01/13/19   [provider]  haloperidol (HALDOL) 2 MG  tablet  01/13/19   [provider]  nicotine (NICODERM CQ - DOSED IN MG/24 HOURS) 14 mg/24hr patch Place 1 patch (14 mg total) onto the skin daily. Patient not taking: No sig reported 01/20/19   Jannifer Rodney A, FNP  nitroGLYCERIN (NITROSTAT) 0.4 MG SL tablet Place 1 tablet (0.4 mg total) under the tongue every 5 (five) minutes as needed for chest pain. Patient not taking: No sig reported 12/17/18   Clapacs, Jackquline Denmark, MD  SPIRIVA HANDIHALER 18 MCG inhalation capsule PLACE 1 CAPSULE (18 MCG TOTAL) INTO INHALER AND INHALE AT BEDTIME. Patient not taking: No sig reported 10/13/19   Jannifer Rodney A, FNP  topiramate (TOPAMAX) 100 MG tablet Take 100 mg by mouth daily. Patient not taking: No sig reported    [provider]    Physical Exam: Vitals:   03/25/20 1730 03/25/20 1830 03/25/20 1900 03/25/20 2000  BP: 92/77 (!) 90/54 102/72 (!) 82/59  Pulse: 76 72 69 72  Resp: 18 14 16 13   Temp:      TempSrc:      SpO2: 92% 90% 95% 91%  Weight: 99.3 kg     Height: 5\' 8"  (1.727 m)       Constitutional: Calm and comfortable until she starts coughing. Vitals:   03/25/20 1730 03/25/20 1830 03/25/20 1900 03/25/20 2000  BP: 92/77 (!) 90/54 102/72 (!) 82/59  Pulse: 76 72 69 72  Resp: 18 14 16 13   Temp:      TempSrc:      SpO2: 92% 90% 95% 91%  Weight: 99.3 kg     Height: 5\' 8"  (1.727 m)      Eyes: PERRL, lids and conjunctivae normal ENMT: Mucous membranes are moist.  Neck:  normal, supple, no masses, no thyromegaly Respiratory: Coughing significantly,, clear to auscultation bilaterally, no wheezing, no crackles. Normal respiratory effort. No accessory muscle use.  Cardiovascular: Regular rate and rhythm, no murmurs / rubs / gallops. No extremity edema. 2+ pedal pulses.  Abdomen: no tenderness, no masses palpated. No hepatosplenomegaly. Bowel sounds positive.  Musculoskeletal: no clubbing / cyanosis. No joint deformity upper and lower extremities. Good ROM, no contractures. Normal muscle tone.  Skin: no rashes, lesions, ulcers. No induration Neurologic: No apparent cranial abnormality, moving extremities spontaneously .  Psychiatric: Normal judgment and insight. Alert and oriented x 3. Normal mood.   Labs on Admission: I have personally reviewed following labs and imaging studies  CBC: Recent Labs  Lab 03/25/20 1747  WBC 8.8  HGB 14.6  HCT 44.8  MCV 97.4  PLT 374   Basic Metabolic Panel: Recent Labs  Lab 03/25/20 1747  NA 137  K 3.9  CL 103  CO2 25  GLUCOSE 89  BUN 8  CREATININE 0.79  CALCIUM 8.6*   Coagulation Profile: Recent Labs  Lab 03/25/20 1918  INR 2.2*    Radiological Exams on Admission: DG Chest 2 View  Result Date: 03/25/2020 CLINICAL DATA:  Shortness of breath. EXAM: CHEST - 2 VIEW COMPARISON:  12/06/2018 FINDINGS: Normal heart size and mediastinal contours. Implanted loop recorder faintly visualized in the left chest wall. Lungs are hyperinflated. Increased bronchial and interstitial thickening from prior exam. No confluent airspace disease. No large pleural effusion. No pneumothorax. Stable calcified granuloma at the right lung base. No acute osseous abnormalities are seen. IMPRESSION: Increased bronchial and interstitial thickening from prior exam, suspicious for acute bronchitis/COPD exacerbation. Electronically Signed   By: 05/25/20 M.D.   On: 03/25/2020 18:52  EKG: Independently reviewed.  Sinus rhythm, QTC 417.   Artifacts present. But no significant change compared to prior.  Assessment/Plan Principal Problem:   COPD with acute exacerbation (HCC) Active Problems:   Acute respiratory failure with hypoxia (HCC)   CAD S/P percutaneous coronary angioplasty   Factor V Leiden (HCC)   History of DVT (deep vein thrombosis)   Ischemic cardiomyopathy    COPD exacerbation with acute respiratory failure -O2 sats 88 to 90% on room air, currently on 2 L COPD with acute exacerbation.  Wheeze/rhonchi not appreciated at this time.  She is on Xarelto.  Portable chest x-ray with increased bronchial/interstitial thickening, suggestive of acute bronchitis/COPD exacerbation. -DuoNebs as needed and scheduled - 125 mg Solu-Medrol given, continue 60 twice daily -Mucolytics as needed, supplemental O2 -Extensive allergy list includes tetracycline, clarithromycin, penicillins and cephalosporins.  Continue IV with  Levaquin.  Hypotension-systolic ranging from 82-102 systolic without intervention.  At baseline patient's blood pressure runs low 90s to low 100s.  Low-grade temp temperature 99.3.  At this time no focus of infection identified. - Follow-up blood cultures ordered in the ED.  -500 mill bolus given, hold off on further IV fluids -Resume carvedilol, losartan in a.m. for cardiomyopathy -UA pending  Tobacco abuse-ongoing, 1 pack/day. -Extensively counseled on quitting tobacco use  Coronary artery disease, ischemic cardiomyopathy-no chest pain.  EKG shows artifact but without significant abnormalities.  Last echo per Care Everywhere 01/05/2020 EF 35 to 40% with mild diastolic dysfunction.  Not on diuretics. -Resume aspirin, carvedilol, Ranexa, losartan, Crestor  Factor V Leyden, history of DVT - Resume xarelto.  Depression -Resume trazodone, Effexor,  DVT prophylaxis: Xarelto Code Status: Full code Family Communication: Family at bedside. Disposition Plan:  ~ 1- 2 days Consults called: None Admission  status: Obs tele   Onnie BoerEjiroghene E Ashlea Dusing MD [facility]  03/25/2020, 10:13 PM

## 2020-03-25 NOTE — ED Triage Notes (Signed)
Pt. Arrived via EMS from the doctor office with complaints of shortness of breath. Per EMS Pts. O2 was 88 on RA. Pt. States they have been short of breath for 6 months now.

## 2020-03-26 ENCOUNTER — Encounter (HOSPITAL_COMMUNITY): Payer: Self-pay | Admitting: Internal Medicine

## 2020-03-26 DIAGNOSIS — Z7901 Long term (current) use of anticoagulants: Secondary | ICD-10-CM | POA: Diagnosis not present

## 2020-03-26 DIAGNOSIS — I252 Old myocardial infarction: Secondary | ICD-10-CM | POA: Diagnosis not present

## 2020-03-26 DIAGNOSIS — I959 Hypotension, unspecified: Secondary | ICD-10-CM | POA: Diagnosis present

## 2020-03-26 DIAGNOSIS — D6851 Activated protein C resistance: Secondary | ICD-10-CM | POA: Diagnosis present

## 2020-03-26 DIAGNOSIS — F32A Depression, unspecified: Secondary | ICD-10-CM | POA: Diagnosis present

## 2020-03-26 DIAGNOSIS — R062 Wheezing: Secondary | ICD-10-CM | POA: Diagnosis not present

## 2020-03-26 DIAGNOSIS — J441 Chronic obstructive pulmonary disease with (acute) exacerbation: Secondary | ICD-10-CM | POA: Diagnosis not present

## 2020-03-26 DIAGNOSIS — Z20822 Contact with and (suspected) exposure to covid-19: Secondary | ICD-10-CM | POA: Diagnosis present

## 2020-03-26 DIAGNOSIS — Z7982 Long term (current) use of aspirin: Secondary | ICD-10-CM | POA: Diagnosis not present

## 2020-03-26 DIAGNOSIS — Z87892 Personal history of anaphylaxis: Secondary | ICD-10-CM | POA: Diagnosis not present

## 2020-03-26 DIAGNOSIS — R0600 Dyspnea, unspecified: Secondary | ICD-10-CM | POA: Diagnosis not present

## 2020-03-26 DIAGNOSIS — Z86718 Personal history of other venous thrombosis and embolism: Secondary | ICD-10-CM | POA: Diagnosis not present

## 2020-03-26 DIAGNOSIS — Z8673 Personal history of transient ischemic attack (TIA), and cerebral infarction without residual deficits: Secondary | ICD-10-CM | POA: Diagnosis not present

## 2020-03-26 DIAGNOSIS — Z955 Presence of coronary angioplasty implant and graft: Secondary | ICD-10-CM | POA: Diagnosis not present

## 2020-03-26 DIAGNOSIS — R0902 Hypoxemia: Secondary | ICD-10-CM | POA: Diagnosis present

## 2020-03-26 DIAGNOSIS — F17210 Nicotine dependence, cigarettes, uncomplicated: Secondary | ICD-10-CM | POA: Diagnosis present

## 2020-03-26 DIAGNOSIS — K219 Gastro-esophageal reflux disease without esophagitis: Secondary | ICD-10-CM | POA: Diagnosis present

## 2020-03-26 DIAGNOSIS — I2510 Atherosclerotic heart disease of native coronary artery without angina pectoris: Secondary | ICD-10-CM | POA: Diagnosis present

## 2020-03-26 DIAGNOSIS — F419 Anxiety disorder, unspecified: Secondary | ICD-10-CM | POA: Diagnosis present

## 2020-03-26 DIAGNOSIS — E785 Hyperlipidemia, unspecified: Secondary | ICD-10-CM | POA: Diagnosis present

## 2020-03-26 DIAGNOSIS — M1990 Unspecified osteoarthritis, unspecified site: Secondary | ICD-10-CM | POA: Diagnosis present

## 2020-03-26 DIAGNOSIS — Z79899 Other long term (current) drug therapy: Secondary | ICD-10-CM | POA: Diagnosis not present

## 2020-03-26 DIAGNOSIS — J439 Emphysema, unspecified: Secondary | ICD-10-CM | POA: Diagnosis not present

## 2020-03-26 DIAGNOSIS — J9601 Acute respiratory failure with hypoxia: Secondary | ICD-10-CM | POA: Diagnosis present

## 2020-03-26 DIAGNOSIS — I255 Ischemic cardiomyopathy: Secondary | ICD-10-CM | POA: Diagnosis present

## 2020-03-26 DIAGNOSIS — I5022 Chronic systolic (congestive) heart failure: Secondary | ICD-10-CM | POA: Diagnosis present

## 2020-03-26 DIAGNOSIS — Z8701 Personal history of pneumonia (recurrent): Secondary | ICD-10-CM | POA: Diagnosis not present

## 2020-03-26 DIAGNOSIS — M797 Fibromyalgia: Secondary | ICD-10-CM | POA: Diagnosis present

## 2020-03-26 LAB — COMPREHENSIVE METABOLIC PANEL
ALT: 13 U/L (ref 0–44)
AST: 17 U/L (ref 15–41)
Albumin: 2.8 g/dL — ABNORMAL LOW (ref 3.5–5.0)
Alkaline Phosphatase: 71 U/L (ref 38–126)
Anion gap: 7 (ref 5–15)
BUN: 10 mg/dL (ref 6–20)
CO2: 23 mmol/L (ref 22–32)
Calcium: 8.6 mg/dL — ABNORMAL LOW (ref 8.9–10.3)
Chloride: 108 mmol/L (ref 98–111)
Creatinine, Ser: 0.65 mg/dL (ref 0.44–1.00)
GFR, Estimated: >60 mL/min (ref >60)
Glucose, Bld: 153 mg/dL — ABNORMAL HIGH (ref 70–99)
Potassium: 4.4 mmol/L (ref 3.5–5.1)
Sodium: 138 mmol/L (ref 135–145)
Total Bilirubin: 0.3 mg/dL (ref 0.3–1.2)
Total Protein: 7.2 g/dL (ref 6.5–8.1)

## 2020-03-26 LAB — CBC
HCT: 43.1 % (ref 36.0–46.0)
Hemoglobin: 14.2 g/dL (ref 12.0–15.0)
MCH: 31.6 pg (ref 26.0–34.0)
MCHC: 32.9 g/dL (ref 30.0–36.0)
MCV: 95.8 fL (ref 80.0–100.0)
Platelets: 355 10*3/uL (ref 150–400)
RBC: 4.5 MIL/uL (ref 3.87–5.11)
RDW: 14.1 % (ref 11.5–15.5)
WBC: 8.2 10*3/uL (ref 4.0–10.5)
nRBC: 0 % (ref 0.0–0.2)

## 2020-03-26 LAB — URINALYSIS, ROUTINE W REFLEX MICROSCOPIC
Bilirubin Urine: NEGATIVE
Glucose, UA: NEGATIVE mg/dL
Ketones, ur: NEGATIVE mg/dL
Nitrite: NEGATIVE
Protein, ur: NEGATIVE mg/dL
Specific Gravity, Urine: 1.006 (ref 1.005–1.030)
pH: 6 (ref 5.0–8.0)

## 2020-03-26 LAB — MRSA PCR SCREENING: MRSA by PCR: NEGATIVE

## 2020-03-26 LAB — HIV ANTIBODY (ROUTINE TESTING W REFLEX): HIV Screen 4th Generation wRfx: NONREACTIVE

## 2020-03-26 MED ORDER — LEVOFLOXACIN IN D5W 750 MG/150ML IV SOLN: 750.0000 mg | INTRAVENOUS | Status: DC

## 2020-03-26 MED ORDER — BUDESONIDE 0.5 MG/2ML IN SUSP
0.5000 mg | Freq: Two times a day (BID) | RESPIRATORY_TRACT | Status: DC
  Administered 2020-03-26 – 2020-03-27 (×3): 0.5 mg via RESPIRATORY_TRACT
  Filled 2020-03-26 (×3): qty 2

## 2020-03-26 NOTE — Progress Notes (Signed)
PROGRESS NOTE  Lindsay Bradford ZDG:[id number] DOB: 10-07-1960 DOA: 03/25/2020 PCP: [name], FNP  Brief History:  60 year old female with a history of COPD, coronary artery disease, factor V Leiden, lower extremity DVT, ischemic cardiomyopathy, tobacco abuse, hyperlipidemia, depression/anxiety presenting with 6-month history of shortness of breath that has worsened over the past week.  The patient continues to smoke 1 pack/day.  She complains of a cough with yellow-green sputum.  She denies any hemoptysis, fevers, chills, headache, neck pain, chest pain, nausea, vomiting, diarrhea, hematochezia, melena, dysuria.  She did have some transient abdominal pain.  The patient denies any worsening orthopnea or increasing lower extremity edema.  She uses albuterol in her nebulizer machine at home on a as needed basis. In the emergency department, patient had low-grade temperature of 99.3 F.  Blood pressure was low initially at 82/59.  Oxygen saturation was 88% on room air.  She was placed on 2 L with saturation up to 97%.  BMP and CBC were essentially unremarkable.  Chest x-ray showed slight increased interstitial prominence.  Assessment/Plan: Acute respiratory failure with hypoxia -Presented with saturation 88% on room air -Oxygen saturation 95-97% on 1.5 L -Wean oxygen as tolerated for saturation greater 90% -Personally reviewed chest x-ray--increased interstitial markings  COPD exacerbation -Continue IV Solu-Medrol -Continue DuoNebs -Start Pulmicort  Ischemic cardiomyopathy/chronic systolic CHF -Status post loop recorder -Device most recently interrogated by her cardiologist on 03/18/2020 without any concerning dysrhythmias -01/06/2020 echo EF 35-40%; apical anterior and inferior apical AK, mild diastolic dysfxn; trace MR/TR -Continue carvedilol -Continue statin -Continue losartan -Continue aspirin 81 mg daily -Continue Ranexa  Factor V Leiden -Continue  rivaroxaban  Depression/anxiety -Continue Effexor, trazodone  Hyperlipidemia -Continue statin  Tobacco abuse -Tobacco cessation discussed  Elevated INR -likely due to Xarelto -check LFTs       Status is: Observation  The patient will require care spanning > 2 midnights and should be moved to inpatient because: IV treatments appropriate due to intensity of illness or inability to take PO  Dispo: The patient is from: Home              Anticipated d/c is to: Home              Patient currently is not medically stable to d/c.   Difficult to place patient No        Family Communication:  no Family at bedside  Consultants:  nonr  Code Status:  FULL  DVT Prophylaxis:  Xarelto   Procedures: As Listed in Progress Note Above  Antibiotics: None     Subjective: Patient states she is breathing better, but still has some dyspnea on exertion.  Denies f/c, cp, n/v/d, hemoptysis  Objective: Vitals:   03/26/20 0153 03/26/20 0515 03/26/20 0612 03/26/20 0739  BP: 106/70  (!) 110/59   Pulse: 68  74   Resp: 18  18   Temp: 98.2 F (36.8 C)  98.1 F (36.7 C)   TempSrc: Oral     SpO2: 97% 95%  96%  Weight:      Height:        Intake/Output Summary (Last 24 hours) at 03/26/2020 0851 Last data filed at 03/26/2020 0600 Gross per 24 hour  Intake 740 ml  Output --  Net 740 ml   Weight change:  Exam:   General:  Pt is alert, follows commands appropriately, not in acute distress  HEENT: No icterus, No thrush, No neck mass, [place]/AT  Cardiovascular: RRR, S1/S2, no rubs, no gallops  Respiratory: bibasilar rales. No wheeze  Abdomen: Soft/+BS, non tender, non distended, no guarding  Extremities: No edema, No lymphangitis, No petechiae, No rashes, no synovitis   Data Reviewed: I have personally reviewed following labs and imaging studies Basic Metabolic Panel: Recent Labs  Lab 03/25/20 1747  NA 137  K 3.9  CL 103  CO2 25  GLUCOSE 89  BUN 8  CREATININE  0.79  CALCIUM 8.6*   Liver Function Tests: No results for input(s): AST, ALT, ALKPHOS, BILITOT, PROT, ALBUMIN in the last 168 hours. No results for input(s): LIPASE, AMYLASE in the last 168 hours. No results for input(s): AMMONIA in the last 168 hours. Coagulation Profile: Recent Labs  Lab 03/25/20 1918  INR 2.2*   CBC: Recent Labs  Lab 03/25/20 1747  WBC 8.8  HGB 14.6  HCT 44.8  MCV 97.4  PLT 374   Cardiac Enzymes: No results for input(s): CKTOTAL, CKMB, CKMBINDEX, TROPONINI in the last 168 hours. BNP: Invalid input(s): POCBNP CBG: No results for input(s): GLUCAP in the last 168 hours. HbA1C: No results for input(s): HGBA1C in the last 72 hours. Urine analysis:    Component Value Date/Time   APPEARANCEUR Clear 05/16/2019 1253   GLUCOSEU Negative 05/16/2019 1253   BILIRUBINUR neg 05/28/2019 1333   BILIRUBINUR Negative 05/16/2019 1253   PROTEINUR Positive (A) 05/28/2019 1333   PROTEINUR Negative 05/16/2019 1253   UROBILINOGEN 0.2 05/28/2019 1333   NITRITE neg 05/28/2019 1333   NITRITE Negative 05/16/2019 1253   LEUKOCYTESUR Trace (A) 05/28/2019 1333   LEUKOCYTESUR Trace (A) 05/16/2019 1253   Sepsis Labs: @LABRCNTIP (procalcitonin:4,lacticidven:4) ) Recent Results (from the past 240 hour(s))  Resp Panel by RT-PCR (Flu A&B, Covid) Nasopharyngeal Swab     Status: None   Collection Time: 03/25/20  6:56 PM   Specimen: Nasopharyngeal Swab; Nasopharyngeal(NP) swabs in vial transport medium  Result Value Ref Range Status   SARS Coronavirus 2 by RT PCR NEGATIVE NEGATIVE Final    Comment: (NOTE) SARS-CoV-2 target nucleic acids are NOT DETECTED.  The SARS-CoV-2 RNA is generally detectable in upper respiratory specimens during the acute phase of infection. The lowest concentration of SARS-CoV-2 viral copies this assay can detect is 138 copies/mL. A negative result does not preclude SARS-Cov-2 infection and should not be used as the sole basis for treatment or other  patient management decisions. A negative result may occur with  improper specimen collection/handling, submission of specimen other than nasopharyngeal swab, presence of viral mutation(s) within the areas targeted by this assay, and inadequate number of viral copies(<138 copies/mL). A negative result must be combined with clinical observations, patient history, and epidemiological information. The expected result is Negative.  Fact Sheet for Patients:  05/25/20  Fact Sheet for Healthcare Providers:  BloggerCourse.com  This test is no t yet approved or cleared by the [place] FDA and  has been authorized for detection and/or diagnosis of SARS-CoV-2 by FDA under an Emergency Use Authorization (EUA). This EUA will remain  in effect (meaning this test can be used) for the duration of the COVID-19 declaration under Section 564(b)(1) of the Act, 21 U.S.C.section 360bbb-3(b)(1), unless the authorization is terminated  or revoked sooner.       Influenza A by PCR NEGATIVE NEGATIVE Final   Influenza B by PCR NEGATIVE NEGATIVE Final    Comment: (NOTE) The Xpert Xpress SARS-CoV-2/FLU/RSV plus assay is intended as an aid in the diagnosis of influenza from Nasopharyngeal swab specimens and should  not be used as a sole basis for treatment. Nasal washings and aspirates are unacceptable for Xpert Xpress SARS-CoV-2/FLU/RSV testing.  Fact Sheet for Patients: BloggerCourse.comhttps://www.fda.gov/media/152166/download  Fact Sheet for Healthcare Providers: SeriousBroker.ithttps://www.fda.gov/media/152162/download  This test is not yet approved or cleared by the [place] FDA and has been authorized for detection and/or diagnosis of SARS-CoV-2 by FDA under an Emergency Use Authorization (EUA). This EUA will remain in effect (meaning this test can be used) for the duration of the COVID-19 declaration under Section 564(b)(1) of the Act, 21 U.S.C. section  360bbb-3(b)(1), unless the authorization is terminated or revoked.  Performed at [facility], [address] [place], [place] [address]   Blood culture (routine x 2)     Status: None (Preliminary result)   Collection Time: 03/25/20  7:18 PM   Specimen: BLOOD  Result Value Ref Range Status   Specimen Description BLOOD RIGHT ANTECUBITAL  Final   Special Requests   Final    BOTTLES DRAWN AEROBIC AND ANAEROBIC Blood Culture adequate volume   Culture   Final    NO GROWTH < 12 HOURS Performed at [facility], [address] [place], [place] [address]    Report Status PENDING  Incomplete  Blood culture (routine x 2)     Status: None (Preliminary result)   Collection Time: 03/25/20  7:18 PM   Specimen: BLOOD  Result Value Ref Range Status   Specimen Description BLOOD LEFT ANTECUBITAL  Final   Special Requests   Final    BOTTLES DRAWN AEROBIC AND ANAEROBIC Blood Culture adequate volume   Culture   Final    NO GROWTH < 12 HOURS Performed at [facility], [address] [place], [place] [address]    Report Status PENDING  Incomplete  MRSA PCR Screening     Status: None   Collection Time: 03/25/20 11:10 PM   Specimen: Nasal Mucosa; Nasopharyngeal  Result Value Ref Range Status   MRSA by PCR NEGATIVE NEGATIVE Final    Comment:        The GeneXpert MRSA Assay (FDA approved for NASAL specimens only), is one component of a comprehensive MRSA colonization surveillance program. It is not intended to diagnose MRSA infection nor to guide or monitor treatment for MRSA infections. Performed at [facility], [address] [place], [place] [address]      Scheduled Meds: . aspirin EC  81 mg Oral Daily  . carvedilol  12.5 mg Oral BID WC  . guaiFENesin-dextromethorphan  10 mL Oral Q8H  . ipratropium-albuterol  3 mL Nebulization TID  . losartan  25 mg Oral Daily  . methylPREDNISolone (SOLU-MEDROL) injection  60 mg Intravenous Q12H  . pantoprazole  40 mg Oral Daily  . ranolazine  500 mg  Oral BID  . rivaroxaban  20 mg Oral Daily  . rosuvastatin  40 mg Oral Daily  . traZODone  150 mg Oral QHS  . valACYclovir  500 mg Oral Daily  . venlafaxine XR  75 mg Oral Q breakfast   Continuous Infusions: . levofloxacin (LEVAQUIN) IV      Procedures/Studies: DG Chest 2 View  Result Date: 03/25/2020 CLINICAL DATA:  Shortness of breath. EXAM: CHEST - 2 VIEW COMPARISON:  12/06/2018 FINDINGS: Normal heart size and mediastinal contours. Implanted loop recorder faintly visualized in the left chest wall. Lungs are hyperinflated. Increased bronchial and interstitial thickening from prior exam. No confluent airspace disease. No large pleural effusion. No pneumothorax. Stable calcified granuloma at the right lung base. No acute osseous abnormalities  are seen. IMPRESSION: Increased bronchial and interstitial thickening from prior exam, suspicious for acute bronchitis/COPD exacerbation. Electronically Signed   By: Narda Rutherford M.D.   On: 03/25/2020 18:52    Catarina Hartshorn, DO  [facility]  If 7PM-7AM, please contact night-coverage [email or web address] Password [facility] 03/26/2020, 8:51 AM   LOS: 0 days

## 2020-03-26 NOTE — Progress Notes (Signed)
Pharmacy Antibiotic Note  Lindsay Bradford is a 60 y.o. female admitted on 03/25/2020 with COPD exacerbation.  Pharmacy has been consulted for Levaqiun dosing. WBC WNL. Renal function OK.   Plan: Levaquin 750 mg IV q24h x 5 days Trend WBC, temp, renal function  F/U infectious work-up PO as able   Height: 5\' 8"  (172.7 cm) Weight: 99.3 kg (219 lb) IBW/kg (Calculated) : 63.9  Temp (24hrs), Avg:98.2 F (36.8 C), Min:97.3 F (36.3 C), Max:99.3 F (37.4 C)  Recent Labs  Lab 03/25/20 1747 03/25/20 1918 03/25/20 2116  WBC 8.8  --   --   CREATININE 0.79  --   --   LATICACIDVEN  --  0.8 1.2    Estimated Creatinine Clearance: 95.7 mL/min (by C-G formula based on SCr of 0.79 mg/dL).    Allergies  Allergen Reactions  . Cephalexin Hives  . Clarithromycin Hives  . Ibuprofen Hives  . Other Itching and Other (See Comments)    Whole milk and oranges  . Oxymorphone Palpitations  . Penicillins Anaphylaxis    Did it involve swelling of the face/tongue/throat, SOB, or low BP? Yes Did it involve sudden or severe rash/hives, skin peeling, or any reaction on the inside of your mouth or nose? Yes Did you need to seek medical attention at a hospital or doctor's office? Yes When did it last happen?childhood allergy If all above answers are "NO", may proceed with cephalosporin use.   2117 Potassium Chloride Hives    Reacts to oral KCl, tolerates it slow IV   . Sulfa Antibiotics Anaphylaxis  . Clindamycin Hives and Itching  . Fentanyl Hives and Other (See Comments)    Fentanyl patch, it breaks pt out in hives and lowers her blood pressure.   . Naproxen Hives  . Iodinated Diagnostic Agents Hives  . Lyrica [Pregabalin]     Chest pain  . Tetracycline Hives and Itching    [name], PharmD, BCPS Clinical Pharmacist Phone: [phone]

## 2020-03-27 DIAGNOSIS — J9601 Acute respiratory failure with hypoxia: Secondary | ICD-10-CM | POA: Diagnosis not present

## 2020-03-27 DIAGNOSIS — D6851 Activated protein C resistance: Secondary | ICD-10-CM | POA: Diagnosis not present

## 2020-03-27 DIAGNOSIS — J441 Chronic obstructive pulmonary disease with (acute) exacerbation: Secondary | ICD-10-CM | POA: Diagnosis not present

## 2020-03-27 MED ORDER — TOPIRAMATE 100 MG PO TABS: 100.0000 mg | ORAL_TABLET | Freq: Every day | ORAL | 1 refills | Status: DC

## 2020-03-27 MED ORDER — PREDNISONE 20 MG PO TABS: 50.0000 mg | ORAL_TABLET | Freq: Every day | ORAL | Status: DC

## 2020-03-27 MED ORDER — PREDNISONE 50 MG PO TABS: 50.0000 mg | ORAL_TABLET | Freq: Every day | ORAL | 0 refills | Status: DC

## 2020-03-27 MED ORDER — PANTOPRAZOLE SODIUM 40 MG PO TBEC: 40.0000 mg | DELAYED_RELEASE_TABLET | Freq: Every day | ORAL | 1 refills | Status: DC

## 2020-03-27 NOTE — TOC Transition Note (Addendum)
Transition of Care [facility]) - CM/SW Discharge Note   Patient Details  Name: Joua Bake MRN: [id number] Date of Birth: 1960/07/12  Transition of Care [facility]) CM/SW Contact:  Barry Brunner, LCSW Phone Number: 03/27/2020, 1:10 PM   Clinical Narrative:    CSW notified of patient's readiness for discharge and O2 need. CSW referred patient to [place] with [employer]. [facility] agreeable to provide O2. TOC signing off.    Final next level of care: Home/Self Care Barriers to Discharge: Barriers Resolved   Patient Goals and CMS Choice Patient states their goals for this hospitalization and ongoing recovery are:: Return home CMS Medicare.gov Compare Post Acute Care list provided to:: Patient Choice offered to / list presented to : Patient  Discharge Placement                    Patient and family notified of of transfer: 03/27/20  Discharge Plan and Services                DME Arranged: Oxygen DME Agency: [employer] Date DME Agency Contacted: 03/27/20 Time DME Agency Contacted: [id number] Representative spoke with at DME Agency: [facility] HH Arranged: NA HH Agency: NA        Social Determinants of Health (SDOH) Interventions     Readmission Risk Interventions No flowsheet data found.

## 2020-03-27 NOTE — Discharge Summary (Signed)
Physician Discharge Summary  Iveth Heidemann NWG:[id number] DOB: 12-Jan-1961 DOA: 03/25/2020  PCP: [name], FNP  Admit date: 03/25/2020 Discharge date: 03/27/2020  Admitted From: Home Disposition:  Home   Recommendations for Outpatient Follow-up:  1. Follow up with PCP in 1-2 weeks 2. Please obtain BMP/CBC in one week   Equipment/Devices: 2 LNC  Discharge Condition: Stable CODE STATUS: FULL Diet recommendation: Heart Healthy    Brief/Interim Summary: 60 year old female with a history of COPD, coronary artery disease, factor V Leiden, lower extremity DVT, ischemic cardiomyopathy, tobacco abuse, hyperlipidemia, depression/anxiety presenting with 38-month history of shortness of breath that has worsened over the past week.  The patient continues to smoke 1 pack/day.  She complains of a cough with yellow-green sputum.  She denies any hemoptysis, fevers, chills, headache, neck pain, chest pain, nausea, vomiting, diarrhea, hematochezia, melena, dysuria.  She did have some transient abdominal pain.  The patient denies any worsening orthopnea or increasing lower extremity edema.  She uses albuterol in her nebulizer machine at home on a as needed basis. In the emergency department, patient had low-grade temperature of 99.3 F.  Blood pressure was low initially at 82/59.  Oxygen saturation was 88% on room air.  She was placed on 2 L with saturation up to 97%.  BMP and CBC were essentially unremarkable.  Chest x-ray showed slight increased interstitial prominence.  She was started on IV steroids and bronchodilators with clinical improvement  Discharge Diagnoses:  Acute respiratory failure with hypoxia -Presented with saturation 88% on room air -Oxygen saturation 95-97% on 2 L -Wean oxygen as tolerated for saturation greater 90% -Personally reviewed chest x-ray--increased interstitial markings -ambulated on day of d/c-->desaturated to 85%-->d/c home with 2L  COPD exacerbation -Continue IV  Solu-Medrol -Continue DuoNebs -Started Pulmicort -overall improved-->d/c home with prednisone x 4 more days  Ischemic cardiomyopathy/chronic systolic CHF -Status post loop recorder -Device most recently interrogated by her cardiologist on 03/18/2020 without any concerning dysrhythmias -01/06/2020 echo EF 35-40%; apical anterior and inferior apical AK, mild diastolic dysfxn; trace MR/TR -Continue carvedilol -Continue statin -Continue losartan -Continue aspirin 81 mg daily -Continue Ranexa -no chest pain throughout hospitalization  Factor V Leiden -Continue rivaroxaban  Depression/anxiety -Continue Effexor, trazodone  Hyperlipidemia -Continue statin  Tobacco abuse -Tobacco cessation discussed  Elevated INR -likely due to Xarelto -check LFTs--normal  Discharge Instructions   Allergies as of 03/27/2020      Reactions   Cephalexin Hives   Clarithromycin Hives   Ibuprofen Hives   Other Itching, Other (See Comments)   oranges   Oxymorphone Palpitations   Penicillins Anaphylaxis   Did it involve swelling of the face/tongue/throat, SOB, or low BP? Yes Did it involve sudden or severe rash/hives, skin peeling, or any reaction on the inside of your mouth or nose? Yes Did you need to seek medical attention at a hospital or doctor's office? Yes When did it last happen?childhood allergy If all above answers are "NO", may proceed with cephalosporin use.   Potassium Chloride Hives   Reacts to oral KCl, tolerates it slow IV   Sulfa Antibiotics Anaphylaxis   Clindamycin Hives, Itching   Fentanyl Hives, Other (See Comments)   Fentanyl patch, it breaks pt out in hives and lowers her blood pressure.   Naproxen Hives   Iodinated Diagnostic Agents Hives   Lyrica [pregabalin]    Chest pain   Tetracycline Hives, Itching      Medication List    STOP taking these medications   gabapentin 300 MG capsule  Commonly known as: NEURONTIN   haloperidol 2 MG tablet Commonly  known as: HALDOL   nicotine 14 mg/24hr patch Commonly known as: NICODERM CQ - dosed in mg/24 hours     TAKE these medications   albuterol 108 (90 Base) MCG/ACT inhaler Commonly known as: VENTOLIN HFA Inhale 1-2 puffs into the lungs every 6 (six) hours as needed for wheezing or shortness of breath.   aspirin 81 MG EC tablet Take 1 tablet (81 mg total) by mouth daily.   Calcium Carbonate-Vitamin D 600-400 MG-UNIT tablet Take by mouth.   carvedilol 12.5 MG tablet Commonly known as: COREG Take 1 tablet (12.5 mg total) by mouth 2 (two) times daily with a meal.   losartan 25 MG tablet Commonly known as: COZAAR Take 25 mg by mouth daily.   nitroGLYCERIN 0.4 MG SL tablet Commonly known as: NITROSTAT Place 1 tablet (0.4 mg total) under the tongue every 5 (five) minutes as needed for chest pain.   omega-3 acid ethyl esters 1 g capsule Commonly known as: LOVAZA Take 1 capsule (1 g total) by mouth 2 (two) times daily.   pantoprazole 40 MG tablet Commonly known as: PROTONIX Take 1 tablet (40 mg total) by mouth daily. (Needs to be seen before next refill)   predniSONE 50 MG tablet Commonly known as: DELTASONE Take 1 tablet (50 mg total) by mouth daily with breakfast. Start taking on: March 28, 2020   ranolazine 500 MG 12 hr tablet Commonly known as: RANEXA Take 500 mg by mouth 2 (two) times daily.   rivaroxaban 20 MG Tabs tablet Commonly known as: Xarelto Take 1 tablet (20 mg total) by mouth daily.   rosuvastatin 40 MG tablet Commonly known as: CRESTOR Take 1 tablet (40 mg total) by mouth daily. (Needs to be seen before next refill)   Spiriva HandiHaler 18 MCG inhalation capsule Generic drug: tiotropium PLACE 1 CAPSULE (18 MCG TOTAL) INTO INHALER AND INHALE AT BEDTIME.   topiramate 100 MG tablet Commonly known as: TOPAMAX Take 1 tablet (100 mg total) by mouth daily.   traZODone 150 MG tablet Commonly known as: DESYREL USE FROM 1/3 TO 1 TABLET NIGHTLY AS NEEDED FOR  SLEEP. What changed: See the new instructions.   valACYclovir 500 MG tablet Commonly known as: VALTREX TAKE 1 TABLET BY MOUTH EVERY DAY   venlafaxine XR 75 MG 24 hr capsule Commonly known as: EFFEXOR-XR Take 1 capsule (75 mg total) by mouth daily with breakfast.            Durable Medical Equipment  (From admission, onward)         Start     Ordered   03/27/20 1157  For home use only DME oxygen  Once       Question Answer Comment  Length of Need 6 Months   Mode or (Route) Nasal cannula   Liters per Minute 2   Frequency Continuous (stationary and portable oxygen unit needed)   Oxygen conserving device Yes   Oxygen delivery system Gas      03/27/20 1156          Allergies  Allergen Reactions  . Cephalexin Hives  . Clarithromycin Hives  . Ibuprofen Hives  . Other Itching and Other (See Comments)    oranges  . Oxymorphone Palpitations  . Penicillins Anaphylaxis    Did it involve swelling of the face/tongue/throat, SOB, or low BP? Yes Did it involve sudden or severe rash/hives, skin peeling, or any reaction on the inside of  your mouth or nose? Yes Did you need to seek medical attention at a hospital or doctor's office? Yes When did it last happen?childhood allergy If all above answers are "NO", may proceed with cephalosporin use.   [name] Potassium Chloride Hives    Reacts to oral KCl, tolerates it slow IV   . Sulfa Antibiotics Anaphylaxis  . Clindamycin Hives and Itching  . Fentanyl Hives and Other (See Comments)    Fentanyl patch, it breaks pt out in hives and lowers her blood pressure.   . Naproxen Hives  . Iodinated Diagnostic Agents Hives  . Lyrica [Pregabalin]     Chest pain  . Tetracycline Hives and Itching    Consultations:  none   Procedures/Studies: DG Chest 2 View  Result Date: 03/25/2020 CLINICAL DATA:  Shortness of breath. EXAM: CHEST - 2 VIEW COMPARISON:  12/06/2018 FINDINGS: Normal heart size and mediastinal contours. Implanted  loop recorder faintly visualized in the left chest wall. Lungs are hyperinflated. Increased bronchial and interstitial thickening from prior exam. No confluent airspace disease. No large pleural effusion. No pneumothorax. Stable calcified granuloma at the right lung base. No acute osseous abnormalities are seen. IMPRESSION: Increased bronchial and interstitial thickening from prior exam, suspicious for acute bronchitis/COPD exacerbation. Electronically Signed   By: Narda Rutherford M.D.   On: 03/25/2020 18:52         Discharge Exam: Vitals:   03/27/20 0817 03/27/20 0824  BP:    Pulse:    Resp:    Temp:    SpO2: 92% 92%   Vitals:   03/26/20 2053 03/27/20 0548 03/27/20 0817 03/27/20 0824  BP: 109/67 107/76    Pulse: 68 66    Resp:      Temp: 97.7 F (36.5 C) 98.6 F (37 C)    TempSrc: Oral     SpO2: 95% 93% 92% 92%  Weight:      Height:        General: Pt is alert, awake, not in acute distress Cardiovascular: RRR, S1/S2 +, no rubs, no gallops Respiratory: diminished BS; no wheeze.  Bibasilar rales Abdominal: Soft, NT, ND, bowel sounds + Extremities: no edema, no cyanosis   The results of significant diagnostics from this hospitalization (including imaging, microbiology, ancillary and laboratory) are listed below for reference.    Significant Diagnostic Studies: DG Chest 2 View  Result Date: 03/25/2020 CLINICAL DATA:  Shortness of breath. EXAM: CHEST - 2 VIEW COMPARISON:  12/06/2018 FINDINGS: Normal heart size and mediastinal contours. Implanted loop recorder faintly visualized in the left chest wall. Lungs are hyperinflated. Increased bronchial and interstitial thickening from prior exam. No confluent airspace disease. No large pleural effusion. No pneumothorax. Stable calcified granuloma at the right lung base. No acute osseous abnormalities are seen. IMPRESSION: Increased bronchial and interstitial thickening from prior exam, suspicious for acute bronchitis/COPD  exacerbation. Electronically Signed   By: Narda Rutherford M.D.   On: 03/25/2020 18:52     Microbiology: Recent Results (from the past 240 hour(s))  Resp Panel by RT-PCR (Flu A&B, Covid) Nasopharyngeal Swab     Status: None   Collection Time: 03/25/20  6:56 PM   Specimen: Nasopharyngeal Swab; Nasopharyngeal(NP) swabs in vial transport medium  Result Value Ref Range Status   SARS Coronavirus 2 by RT PCR NEGATIVE NEGATIVE Final    Comment: (NOTE) SARS-CoV-2 target nucleic acids are NOT DETECTED.  The SARS-CoV-2 RNA is generally detectable in upper respiratory specimens during the acute phase of infection. The lowest concentration of SARS-CoV-2  viral copies this assay can detect is 138 copies/mL. A negative result does not preclude SARS-Cov-2 infection and should not be used as the sole basis for treatment or other patient management decisions. A negative result may occur with  improper specimen collection/handling, submission of specimen other than nasopharyngeal swab, presence of viral mutation(s) within the areas targeted by this assay, and inadequate number of viral copies(<138 copies/mL). A negative result must be combined with clinical observations, patient history, and epidemiological information. The expected result is Negative.  Fact Sheet for Patients:  BloggerCourse.com  Fact Sheet for Healthcare Providers:  SeriousBroker.it  This test is no t yet approved or cleared by the [place] FDA and  has been authorized for detection and/or diagnosis of SARS-CoV-2 by FDA under an Emergency Use Authorization (EUA). This EUA will remain  in effect (meaning this test can be used) for the duration of the COVID-19 declaration under Section 564(b)(1) of the Act, 21 U.S.C.section 360bbb-3(b)(1), unless the authorization is terminated  or revoked sooner.       Influenza A by PCR NEGATIVE NEGATIVE Final   Influenza B by PCR  NEGATIVE NEGATIVE Final    Comment: (NOTE) The Xpert Xpress SARS-CoV-2/FLU/RSV plus assay is intended as an aid in the diagnosis of influenza from Nasopharyngeal swab specimens and should not be used as a sole basis for treatment. Nasal washings and aspirates are unacceptable for Xpert Xpress SARS-CoV-2/FLU/RSV testing.  Fact Sheet for Patients: BloggerCourse.com  Fact Sheet for Healthcare Providers: SeriousBroker.it  This test is not yet approved or cleared by the [place] FDA and has been authorized for detection and/or diagnosis of SARS-CoV-2 by FDA under an Emergency Use Authorization (EUA). This EUA will remain in effect (meaning this test can be used) for the duration of the COVID-19 declaration under Section 564(b)(1) of the Act, 21 U.S.C. section 360bbb-3(b)(1), unless the authorization is terminated or revoked.  Performed at [facility], [address] [place], [place] [address]   Blood culture (routine x 2)     Status: None (Preliminary result)   Collection Time: 03/25/20  7:18 PM   Specimen: BLOOD  Result Value Ref Range Status   Specimen Description BLOOD RIGHT ANTECUBITAL  Final   Special Requests   Final    BOTTLES DRAWN AEROBIC AND ANAEROBIC Blood Culture adequate volume   Culture   Final    NO GROWTH 2 DAYS Performed at [facility], [address] [place], [place] [address]    Report Status PENDING  Incomplete  Blood culture (routine x 2)     Status: None (Preliminary result)   Collection Time: 03/25/20  7:18 PM   Specimen: BLOOD  Result Value Ref Range Status   Specimen Description BLOOD LEFT ANTECUBITAL  Final   Special Requests   Final    BOTTLES DRAWN AEROBIC AND ANAEROBIC Blood Culture adequate volume   Culture   Final    NO GROWTH 2 DAYS Performed at [facility], [address], [place], [place] [address]    Report Status PENDING  Incomplete  MRSA PCR Screening     Status: None   Collection  Time: 03/25/20 11:10 PM   Specimen: Nasal Mucosa; Nasopharyngeal  Result Value Ref Range Status   MRSA by PCR NEGATIVE NEGATIVE Final    Comment:        The GeneXpert MRSA Assay (FDA approved for NASAL specimens only), is one component of a comprehensive MRSA colonization surveillance program. It is not intended to diagnose MRSA infection nor  to guide or monitor treatment for MRSA infections. Performed at [facility], [address], [place], [place] [address]      Labs: Basic Metabolic Panel: Recent Labs  Lab 03/25/20 1747 03/26/20 0933  NA 137 138  K 3.9 4.4  CL 103 108  CO2 25 23  GLUCOSE 89 153*  BUN 8 10  CREATININE 0.79 0.65  CALCIUM 8.6* 8.6*   Liver Function Tests: Recent Labs  Lab 03/26/20 0933  AST 17  ALT 13  ALKPHOS 71  BILITOT 0.3  PROT 7.2  ALBUMIN 2.8*   No results for input(s): LIPASE, AMYLASE in the last 168 hours. No results for input(s): AMMONIA in the last 168 hours. CBC: Recent Labs  Lab 03/25/20 1747 03/26/20 0933  WBC 8.8 8.2  HGB 14.6 14.2  HCT 44.8 43.1  MCV 97.4 95.8  PLT 374 355   Cardiac Enzymes: No results for input(s): CKTOTAL, CKMB, CKMBINDEX, TROPONINI in the last 168 hours. BNP: Invalid input(s): POCBNP CBG: No results for input(s): GLUCAP in the last 168 hours.  Time coordinating discharge:  36 minutes  Signed:  Catarina Hartshorn, DO [facility] Pager: [phone] 03/27/2020, 12:04 PM

## 2020-03-27 NOTE — Progress Notes (Signed)
SATURATION QUALIFICATIONS: (This note is used to comply with regulatory documentation for home oxygen)   Patient Saturations on Room Air at Rest = 91   Patient Saturations on Room Air while Ambulating = 85   Patient Saturations on 2 Liters of oxygen while Ambulating = 95   Please briefly explain why patient needs home oxygen: To maintain 02 sat at 90% or above during ambulation.  [name], DO 

## 2020-03-29 ENCOUNTER — Telehealth: Payer: Self-pay | Admitting: *Deleted

## 2020-03-29 NOTE — Telephone Encounter (Signed)
Contact Date: 03/29/2020 Contacted By: Almira Coaster, LPN  Transition Care Management Follow-up Telephone Call  Date of discharge and from where: 03/27/20 [facility]  Discharge Diagnosis:hypoxia  How have you been since you were released from the hospital? ok  Any questions or concerns? No   Items Reviewed:  Did the pt receive and understand the discharge instructions provided? Yes   Medications obtained and verified? Yes   Any new allergies since your discharge? No   Dietary orders reviewed? No  Do you have support at home? Yes   Discontinued Medications no New Medications Added Topiramate Prednisone oxygen  Current Medication List Allergies as of 03/29/2020      Reactions   Cephalexin Hives   Clarithromycin Hives   Ibuprofen Hives   Other Itching, Other (See Comments)   oranges   Oxymorphone Palpitations   Penicillins Anaphylaxis   Did it involve swelling of the face/tongue/throat, SOB, or low BP? Yes Did it involve sudden or severe rash/hives, skin peeling, or any reaction on the inside of your mouth or nose? Yes Did you need to seek medical attention at a hospital or doctor's office? Yes When did it last happen?childhood allergy If all above answers are "NO", may proceed with cephalosporin use.   Potassium Chloride Hives   Reacts to oral KCl, tolerates it slow IV   Sulfa Antibiotics Anaphylaxis   Clindamycin Hives, Itching   Fentanyl Hives, Other (See Comments)   Fentanyl patch, it breaks pt out in hives and lowers her blood pressure.   Naproxen Hives   Iodinated Diagnostic Agents Hives   Lyrica [pregabalin]    Chest pain   Tetracycline Hives, Itching      Medication List       Accurate as of March 29, 2020  4:20 PM. If you have any questions, ask your nurse or doctor.        albuterol 108 (90 Base) MCG/ACT inhaler Commonly known as: VENTOLIN HFA Inhale 1-2 puffs into the lungs every 6 (six) hours as needed for wheezing or shortness of  breath.   aspirin 81 MG EC tablet Take 1 tablet (81 mg total) by mouth daily.   Calcium Carbonate-Vitamin D 600-400 MG-UNIT tablet Take by mouth.   carvedilol 12.5 MG tablet Commonly known as: COREG Take 1 tablet (12.5 mg total) by mouth 2 (two) times daily with a meal.   losartan 25 MG tablet Commonly known as: COZAAR Take 25 mg by mouth daily.   nitroGLYCERIN 0.4 MG SL tablet Commonly known as: NITROSTAT Place 1 tablet (0.4 mg total) under the tongue every 5 (five) minutes as needed for chest pain.   omega-3 acid ethyl esters 1 g capsule Commonly known as: LOVAZA Take 1 capsule (1 g total) by mouth 2 (two) times daily.   pantoprazole 40 MG tablet Commonly known as: PROTONIX Take 1 tablet (40 mg total) by mouth daily. (Needs to be seen before next refill)   predniSONE 50 MG tablet Commonly known as: DELTASONE Take 1 tablet (50 mg total) by mouth daily with breakfast.   ranolazine 500 MG 12 hr tablet Commonly known as: RANEXA Take 500 mg by mouth 2 (two) times daily.   rivaroxaban 20 MG Tabs tablet Commonly known as: Xarelto Take 1 tablet (20 mg total) by mouth daily.   rosuvastatin 40 MG tablet Commonly known as: CRESTOR Take 1 tablet (40 mg total) by mouth daily. (Needs to be seen before next refill)   Spiriva HandiHaler 18 MCG inhalation capsule Generic drug:  tiotropium PLACE 1 CAPSULE (18 MCG TOTAL) INTO INHALER AND INHALE AT BEDTIME.   topiramate 100 MG tablet Commonly known as: TOPAMAX Take 1 tablet (100 mg total) by mouth daily.   traZODone 150 MG tablet Commonly known as: DESYREL USE FROM 1/3 TO 1 TABLET NIGHTLY AS NEEDED FOR SLEEP. What changed: See the new instructions.   valACYclovir 500 MG tablet Commonly known as: VALTREX TAKE 1 TABLET BY MOUTH EVERY DAY   venlafaxine XR 75 MG 24 hr capsule Commonly known as: EFFEXOR-XR Take 1 capsule (75 mg total) by mouth daily with breakfast.        Home Care and Equipment/Supplies: Were home  health services ordered? no If so, what is the name of the agency? n/a  Has the agency set up a time to come to the patient's home? not applicable Were any new equipment or medical supplies ordered?  Yes: oxygen What is the name of the medical supply agency? n/a Were you able to get the supplies/equipment? yes Do you have any questions related to the use of the equipment or supplies? No  Functional Questionnaire: (I = Independent and D = Dependent) ADLs: I  Bathing/Dressing- I  Meal Prep- I  Eating- I  Maintaining continence- I  Transferring/Ambulation- I  Managing Meds- I  Follow up appointments reviewed:   PCP Hospital f/u appt confirmed? Yes  Scheduled to see Harlow Mares, FNP-C on 04/01/20 @ 4:00.  Specialist Hospital f/u appt confirmed? No  Scheduled to see n/a on n/a @ n/a.  Are transportation arrangements needed? No   If their condition worsens, is the pt aware to call PCP or go to the Emergency Dept.? Yes  Was the patient provided with contact information for the PCP's office or ED? Yes  Was to pt encouraged to call back with questions or concerns? Yes

## 2020-03-30 ENCOUNTER — Other Ambulatory Visit: Payer: Self-pay | Admitting: Family

## 2020-03-30 ENCOUNTER — Other Ambulatory Visit: Payer: Self-pay | Admitting: Psychiatry

## 2020-03-30 DIAGNOSIS — R0600 Dyspnea, unspecified: Secondary | ICD-10-CM | POA: Diagnosis not present

## 2020-03-30 DIAGNOSIS — J439 Emphysema, unspecified: Secondary | ICD-10-CM | POA: Diagnosis not present

## 2020-03-30 DIAGNOSIS — J441 Chronic obstructive pulmonary disease with (acute) exacerbation: Secondary | ICD-10-CM | POA: Diagnosis not present

## 2020-03-30 DIAGNOSIS — R062 Wheezing: Secondary | ICD-10-CM | POA: Diagnosis not present

## 2020-03-30 DIAGNOSIS — R55 Syncope and collapse: Secondary | ICD-10-CM | POA: Diagnosis not present

## 2020-03-30 LAB — CULTURE, BLOOD (ROUTINE X 2)
Culture: NO GROWTH
Culture: NO GROWTH
Special Requests: ADEQUATE
Special Requests: ADEQUATE

## 2020-04-01 ENCOUNTER — Ambulatory Visit (INDEPENDENT_AMBULATORY_CARE_PROVIDER_SITE_OTHER): Payer: Medicare HMO | Admitting: Family Medicine

## 2020-04-01 ENCOUNTER — Encounter: Payer: Self-pay | Admitting: Family Medicine

## 2020-04-01 ENCOUNTER — Other Ambulatory Visit: Payer: Self-pay

## 2020-04-01 VITALS — BP 73/50 | HR 70 | Temp 97.4°F

## 2020-04-01 DIAGNOSIS — Z09 Encounter for follow-up examination after completed treatment for conditions other than malignant neoplasm: Secondary | ICD-10-CM | POA: Diagnosis not present

## 2020-04-01 DIAGNOSIS — R0902 Hypoxemia: Secondary | ICD-10-CM | POA: Diagnosis not present

## 2020-04-01 DIAGNOSIS — I959 Hypotension, unspecified: Secondary | ICD-10-CM | POA: Diagnosis not present

## 2020-04-01 DIAGNOSIS — J441 Chronic obstructive pulmonary disease with (acute) exacerbation: Secondary | ICD-10-CM | POA: Diagnosis not present

## 2020-04-01 MED ORDER — ALBUTEROL SULFATE HFA 108 (90 BASE) MCG/ACT IN AERS: 1.0000 | INHALATION_SPRAY | Freq: Four times a day (QID) | RESPIRATORY_TRACT | 1 refills | Status: DC | PRN

## 2020-04-01 NOTE — Progress Notes (Signed)
Established Patient Office Visit  Subjective:  Patient ID: Lindsay Bradford, female    DOB: [birth year]  Age: 60 y.o. MRN: [id number]  CC:  Chief Complaint  Patient presents with  . Transitions Of Care    HPI Lindsay Bradford presents for TOC.  Today's visit was for Transitional Care Management.  The patient was discharged from [facility] on 03/27/20 with a primary diagnosis of hypoxia.   Contact with the patient and/or caregiver, by a clinical staff member, was made on 03/29/20 and was documented as a telephone encounter within the EMR.  Through chart review and discussion with the patient I have determined that management of their condition is of moderate complexity.   Lindsay Bradford was admitted on [facility] on 03/25/20 for a hypoxia due to a COPD exacerbation. She had a low grade fever and hypotension on admission. Oxygen staturation improved with 2L of oxygen. BMP and CBC were unremarkable. CXR showed increased intersititial prominence. She was started on IV steorid and bronchodilators with improvement. She was discharged home on 2L of oxygen.  Her BP is very low today. She reports that she has hypotension. She reports that her cardiologist told her that her coreg may lower her BP even more. She reports that she is feeling much better. She denies dizziness, lightheadedness, shortness of breath, fatigue, fever, chills, increased sputum production, syncope, or pre-syncope.    Past Medical History:  Diagnosis Date  . Arthritis   . Asthma   . Bursitis of left hip   . COPD (chronic obstructive pulmonary disease) ([place])   . Coronary artery disease   . Depression   . DVT (deep venous thrombosis) (HCC)    left  . Factor V Leiden ([place])   . Fibromyalgia   . GERD (gastroesophageal reflux disease)   . Headache    sesaonal  . Herpes simplex type 2 infection   . History of blood transfusion   . Hyperlipidemia   . Hypotension    90's, Normally; in pain normal; BP  . Ischemic cardiomyopathy   . MI  (myocardial infarction) ([place])    x 2 2000   . Pneumonia   . Stroke ([place]) 02/27/2018   no residual    Past Surgical History:  Procedure Laterality Date  . ABDOMINAL HYSTERECTOMY    . APPENDECTOMY    . CARDIAC CATHETERIZATION    . COLONOSCOPY    . FOOT SURGERY Bilateral   . LOOP RECORDER IMPLANT  06/20/2018   [facility]   . ORIF ANKLE FRACTURE Right 10/18/2018   Procedure: OPEN REDUCTION INTERNAL FIXATION (ORIF) ANKLE FRACTURE;  Surgeon: Shona Needles, MD;  Location: [facility];  Service: Orthopedics;  Laterality: Right;  . SHOULDER SURGERY Right   . VEIN BYPASS SURGERY Left     Family History  Problem Relation Age of Onset  . Arthritis Mother   . Cancer Mother   . Colon cancer Mother   . Breast cancer Mother   . COPD Father   . COPD Brother   . Heart disease Sister   . COPD Brother   . Stroke Brother   . Heart disease Brother   . Clotting disorder Brother   . Colon cancer Maternal Grandmother   . Cancer Maternal Grandmother   . Breast cancer Maternal Grandmother   . Cancer Paternal Grandmother   . Colon cancer Paternal Grandmother   . Cancer Nephew   . Colon cancer Nephew     Social History   Socioeconomic History  . Marital status: Single  Spouse name: Not on file  . Number of children: 2  . Years of education: Not on file  . Highest education level: Some college, no degree  Occupational History  . Occupation: Disable   Tobacco Use  . Smoking status: Heavy Tobacco Smoker    Packs/day: 0.50    Years: 35.00    Pack years: 17.50    Types: Cigarettes  . Smokeless tobacco: Never Used  Vaping Use  . Vaping Use: Some days  . Substances: Flavoring  Substance and Sexual Activity  . Alcohol use: Not Currently  . Drug use: Not Currently  . Sexual activity: Not Currently  Other Topics Concern  . Not on file  Social History Narrative  . Not on file   Social Determinants of Health   Financial Resource Strain: Not on file  Food Insecurity: Not on file   Transportation Needs: Not on file  Physical Activity: Not on file  Stress: Not on file  Social Connections: Not on file  Intimate Partner Violence: Not on file    Outpatient Medications Prior to Visit  Medication Sig Dispense Refill  . aspirin EC 81 MG EC tablet Take 1 tablet (81 mg total) by mouth daily. 30 tablet 1  . Calcium Carbonate-Vitamin D 600-400 MG-UNIT tablet Take by mouth.    . carvedilol (COREG) 12.5 MG tablet Take 1 tablet (12.5 mg total) by mouth 2 (two) times daily with a meal. 60 tablet 1  . losartan (COZAAR) 25 MG tablet Take 25 mg by mouth daily.    . nitroGLYCERIN (NITROSTAT) 0.4 MG SL tablet Place 1 tablet (0.4 mg total) under the tongue every 5 (five) minutes as needed for chest pain. 30 tablet 1  . pantoprazole (PROTONIX) 40 MG tablet Take 1 tablet (40 mg total) by mouth daily. (Needs to be seen before next refill) 30 tablet 1  . ranolazine (RANEXA) 500 MG 12 hr tablet Take 500 mg by mouth 2 (two) times daily.    . rivaroxaban (XARELTO) 20 MG TABS tablet Take 1 tablet (20 mg total) by mouth daily. 30 tablet 1  . rosuvastatin (CRESTOR) 40 MG tablet Take 1 tablet (40 mg total) by mouth daily. (Needs to be seen before next refill) 30 tablet 0  . topiramate (TOPAMAX) 100 MG tablet Take 1 tablet (100 mg total) by mouth daily. 30 tablet 1  . traZODone (DESYREL) 150 MG tablet USE FROM 1/3 TO 1 TABLET NIGHTLY AS NEEDED FOR SLEEP. (Patient taking differently: Take 150 mg by mouth at bedtime.) 90 tablet 1  . valACYclovir (VALTREX) 500 MG tablet TAKE 1 TABLET BY MOUTH EVERY DAY 90 tablet 1  . venlafaxine XR (EFFEXOR-XR) 75 MG 24 hr capsule Take 1 capsule (75 mg total) by mouth daily with breakfast. 90 capsule 3  . albuterol (VENTOLIN HFA) 108 (90 Base) MCG/ACT inhaler Inhale 1-2 puffs into the lungs every 6 (six) hours as needed for wheezing or shortness of breath. (Patient not taking: Reported on 04/01/2020) 8 g 1  . omega-3 acid ethyl esters (LOVAZA) 1 g capsule Take 1 capsule  (1 g total) by mouth 2 (two) times daily. (Patient not taking: Reported on 04/01/2020) 60 capsule 1  . predniSONE (DELTASONE) 50 MG tablet Take 1 tablet (50 mg total) by mouth daily with breakfast. 4 tablet 0  . SPIRIVA HANDIHALER 18 MCG inhalation capsule PLACE 1 CAPSULE (18 MCG TOTAL) INTO INHALER AND INHALE AT BEDTIME. 90 capsule 4   No facility-administered medications prior to visit.  Allergies  Allergen Reactions  . Cephalexin Hives  . Clarithromycin Hives  . Ibuprofen Hives  . Other Itching and Other (See Comments)    oranges  . Oxymorphone Palpitations  . Penicillins Anaphylaxis    Did it involve swelling of the face/tongue/throat, SOB, or low BP? Yes Did it involve sudden or severe rash/hives, skin peeling, or any reaction on the inside of your mouth or nose? Yes Did you need to seek medical attention at a hospital or doctor's office? Yes When did it last happen?childhood allergy If all above answers are "NO", may proceed with cephalosporin use.   [name] Potassium Chloride Hives    Reacts to oral KCl, tolerates it slow IV   . Sulfa Antibiotics Anaphylaxis  . Clindamycin Hives and Itching  . Fentanyl Hives and Other (See Comments)    Fentanyl patch, it breaks pt out in hives and lowers her blood pressure.   . Naproxen Hives  . Iodinated Diagnostic Agents Hives  . Lyrica [Pregabalin]     Chest pain  . Tetracycline Hives and Itching    ROS Review of Systems  As per HPI.    Objective:    Physical Exam Vitals and nursing note reviewed.  Constitutional:      General: She is not in acute distress.    Appearance: Normal appearance. She is not ill-appearing, toxic-appearing or diaphoretic.  HENT:     Head: Normocephalic and atraumatic.  Cardiovascular:     Rate and Rhythm: Normal rate and regular rhythm.     Heart sounds: Normal heart sounds. No murmur heard.   Pulmonary:     Effort: Pulmonary effort is normal. No respiratory distress.     Breath sounds:  Normal breath sounds. No stridor. No wheezing, rhonchi or rales.  Chest:     Chest wall: No tenderness.  Musculoskeletal:     Right lower leg: No edema.  Skin:    General: Skin is warm and dry.     Coloration: Skin is not pale.  Neurological:     General: No focal deficit present.     Mental Status: She is alert and oriented to person, place, and time.  Psychiatric:        Mood and Affect: Mood normal.        Behavior: Behavior normal.     BP (!) 73/50   Pulse 70   Temp (!) 97.4 F (36.3 C) (Temporal)   SpO2 94% Comment: room air Wt Readings from Last 3 Encounters:  03/25/20 219 lb (99.3 kg)  03/25/20 219 lb 9.6 oz (99.6 kg)  05/28/19 200 lb (90.7 kg)    There are no preventive care reminders to display for this patient.  There are no preventive care reminders to display for this patient.  No results found for: TSH Lab Results  Component Value Date   WBC 8.2 03/26/2020   HGB 14.2 03/26/2020   HCT 43.1 03/26/2020   MCV 95.8 03/26/2020   PLT 355 03/26/2020   Lab Results  Component Value Date   NA 138 03/26/2020   K 4.4 03/26/2020   CO2 23 03/26/2020   GLUCOSE 153 (H) 03/26/2020   BUN 10 03/26/2020   CREATININE 0.65 03/26/2020   BILITOT 0.3 03/26/2020   ALKPHOS 71 03/26/2020   AST 17 03/26/2020   ALT 13 03/26/2020   PROT 7.2 03/26/2020   ALBUMIN 2.8 (L) 03/26/2020   CALCIUM 8.6 (L) 03/26/2020   ANIONGAP 7 03/26/2020   Lab Results  Component Value Date  CHOL 131 08/26/2018   Lab Results  Component Value Date   HDL 36 (L) 08/26/2018   Lab Results  Component Value Date   LDLCALC 77 08/26/2018   Lab Results  Component Value Date   TRIG 92 08/26/2018   Lab Results  Component Value Date   CHOLHDL 3.6 08/26/2018   No results found for: HGBA1C    Assessment & Plan:   Lindsay Bradford was seen today for transitions of care.  Diagnoses and all orders for this visit:  Hypotension, unspecified hypotension type BP 74/53 and 73/50 today on repeat. I advised  patient to go to ED for hypotension, patient refused. Hold Coreg and losartan for now, patient agrees. Patient refused lab work today and refused follow up in office tomorrow and she needs to get to work today and will need to work tomorrow afternoon. She will check her BP tomorrow at [facility] and notify our office. She will come by on Monday for labs as below. Reiterated dangers of hypotension and reason for my advice to go to ED, again patient refused.  -     BMP8+EGFR; Future -     CBC with Differential/Platelet; Future  COPD with acute exacerbation ([place]) Improving. Refill of albuterol sent in. Pulse ox 95-97% today on RA. Lungs clear on exam.  -     albuterol (VENTOLIN HFA) 108 (90 Base) MCG/ACT inhaler; Inhale 1-2 puffs into the lungs every 6 (six) hours as needed for wheezing or shortness of breath. -     BMP8+EGFR; Future -     CBC with Differential/Platelet; Future  Hypoxia Resolved.  -     BMP8+EGFR; Future -     CBC with Differential/Platelet; Future  Hospital discharge follow-up Reviewed hospital records.   Return to office for new or worsening symptoms, or if symptoms persist.    Gwenlyn Perking, FNP

## 2020-04-02 ENCOUNTER — Other Ambulatory Visit: Payer: Self-pay | Admitting: Family

## 2020-04-02 DIAGNOSIS — Z8673 Personal history of transient ischemic attack (TIA), and cerebral infarction without residual deficits: Secondary | ICD-10-CM

## 2020-04-02 DIAGNOSIS — E785 Hyperlipidemia, unspecified: Secondary | ICD-10-CM

## 2020-04-02 DIAGNOSIS — I2510 Atherosclerotic heart disease of native coronary artery without angina pectoris: Secondary | ICD-10-CM

## 2020-04-02 DIAGNOSIS — I739 Peripheral vascular disease, unspecified: Secondary | ICD-10-CM

## 2020-04-05 ENCOUNTER — Other Ambulatory Visit: Payer: Self-pay

## 2020-04-05 ENCOUNTER — Other Ambulatory Visit: Payer: Medicare HMO

## 2020-04-05 DIAGNOSIS — R0902 Hypoxemia: Secondary | ICD-10-CM

## 2020-04-05 DIAGNOSIS — J441 Chronic obstructive pulmonary disease with (acute) exacerbation: Secondary | ICD-10-CM | POA: Diagnosis not present

## 2020-04-05 DIAGNOSIS — I959 Hypotension, unspecified: Secondary | ICD-10-CM

## 2020-04-06 LAB — CBC WITH DIFFERENTIAL/PLATELET
Basophils Absolute: 0.1 10*3/uL (ref 0.0–0.2)
Basos: 1 %
EOS (ABSOLUTE): 0.2 10*3/uL (ref 0.0–0.4)
Eos: 4 %
Hematocrit: 45.6 % (ref 34.0–46.6)
Hemoglobin: 15.6 g/dL (ref 11.1–15.9)
Immature Grans (Abs): 0 10*3/uL (ref 0.0–0.1)
Immature Granulocytes: 0 %
Lymphocytes Absolute: 2.4 10*3/uL (ref 0.7–3.1)
Lymphs: 41 %
MCH: 31.5 pg (ref 26.6–33.0)
MCHC: 34.2 g/dL (ref 31.5–35.7)
MCV: 92 fL (ref 79–97)
Monocytes Absolute: 0.8 10*3/uL (ref 0.1–0.9)
Monocytes: 13 %
Neutrophils Absolute: 2.5 10*3/uL (ref 1.4–7.0)
Neutrophils: 41 %
Platelets: 285 10*3/uL (ref 150–450)
RBC: 4.96 x10E6/uL (ref 3.77–5.28)
RDW: 13.6 % (ref 11.7–15.4)
WBC: 6 10*3/uL (ref 3.4–10.8)

## 2020-04-06 LAB — BMP8+EGFR
BUN/Creatinine Ratio: 8 — ABNORMAL LOW (ref 9–23)
BUN: 7 mg/dL (ref 6–24)
CO2: 19 mmol/L — ABNORMAL LOW (ref 20–29)
Calcium: 8.6 mg/dL — ABNORMAL LOW (ref 8.7–10.2)
Chloride: 105 mmol/L (ref 96–106)
Creatinine, Ser: 0.85 mg/dL (ref 0.57–1.00)
Glucose: 129 mg/dL — ABNORMAL HIGH (ref 65–99)
Potassium: 3.9 mmol/L (ref 3.5–5.2)
Sodium: 140 mmol/L (ref 134–144)
eGFR: 80 mL/min/{1.73_m2} (ref >59)

## 2020-04-07 DIAGNOSIS — J439 Emphysema, unspecified: Secondary | ICD-10-CM | POA: Diagnosis not present

## 2020-04-07 DIAGNOSIS — R0600 Dyspnea, unspecified: Secondary | ICD-10-CM | POA: Diagnosis not present

## 2020-04-07 DIAGNOSIS — R062 Wheezing: Secondary | ICD-10-CM | POA: Diagnosis not present

## 2020-04-14 ENCOUNTER — Ambulatory Visit (INDEPENDENT_AMBULATORY_CARE_PROVIDER_SITE_OTHER): Payer: Medicare HMO | Admitting: Licensed Clinical Social Worker

## 2020-04-14 DIAGNOSIS — I2510 Atherosclerotic heart disease of native coronary artery without angina pectoris: Secondary | ICD-10-CM | POA: Diagnosis not present

## 2020-04-14 DIAGNOSIS — Z9861 Coronary angioplasty status: Secondary | ICD-10-CM | POA: Diagnosis not present

## 2020-04-14 DIAGNOSIS — Z86718 Personal history of other venous thrombosis and embolism: Secondary | ICD-10-CM

## 2020-04-14 DIAGNOSIS — F332 Major depressive disorder, recurrent severe without psychotic features: Secondary | ICD-10-CM | POA: Diagnosis not present

## 2020-04-14 DIAGNOSIS — J441 Chronic obstructive pulmonary disease with (acute) exacerbation: Secondary | ICD-10-CM | POA: Diagnosis not present

## 2020-04-14 DIAGNOSIS — K219 Gastro-esophageal reflux disease without esophagitis: Secondary | ICD-10-CM

## 2020-04-14 DIAGNOSIS — Z8673 Personal history of transient ischemic attack (TIA), and cerebral infarction without residual deficits: Secondary | ICD-10-CM

## 2020-04-14 NOTE — Chronic Care Management (AMB) (Signed)
Chronic Care Management    Clinical Social Work Note  04/14/2020 Name: Lindsay Bradford MRN: [id number] DOB: 1960/03/03  Lindsay Bradford is a 60 y.o. year old female who is a primary care patient of [name], FNP. The CCM team was consulted to assist the patient with chronic disease management and/or care coordination needs related to: [employer] .   Engaged with patient by telephone for follow up visit in response to provider referral for social work chronic care management and care coordination services.   Consent to Services:  The patient was given information about Chronic Care Management services, agreed to services, and gave verbal consent prior to initiation of services.  Please see initial visit note for detailed documentation.   Patient agreed to services and consent obtained.   Assessment: Review of patient past medical history, allergies, medications, and health status, including review of relevant consultants reports was performed today as part of a comprehensive evaluation and provision of chronic care management and care coordination services.     SDOH (Social Determinants of Health) assessments and interventions performed:    Advanced Directives Status: See Vynca application for related entries.  CCM Care Plan  Allergies  Allergen Reactions  . Cephalexin Hives  . Clarithromycin Hives  . Ibuprofen Hives  . Other Itching and Other (See Comments)    oranges  . Oxymorphone Palpitations  . Penicillins Anaphylaxis    Did it involve swelling of the face/tongue/throat, SOB, or low BP? Yes Did it involve sudden or severe rash/hives, skin peeling, or any reaction on the inside of your mouth or nose? Yes Did you need to seek medical attention at a hospital or doctor's office? Yes When did it last happen?childhood allergy If all above answers are "NO", may proceed with cephalosporin use.   [name] Potassium Chloride Hives    Reacts to oral KCl, tolerates it slow  IV   . Sulfa Antibiotics Anaphylaxis  . Clindamycin Hives and Itching  . Fentanyl Hives and Other (See Comments)    Fentanyl patch, it breaks pt out in hives and lowers her blood pressure.   . Naproxen Hives  . Iodinated Diagnostic Agents Hives  . Lyrica [Pregabalin]     Chest pain  . Tetracycline Hives and Itching    Outpatient Encounter Medications as of 04/14/2020  Medication Sig Note  . albuterol (VENTOLIN HFA) 108 (90 Base) MCG/ACT inhaler Inhale 1-2 puffs into the lungs every 6 (six) hours as needed for wheezing or shortness of breath.   [name] aspirin EC 81 MG EC tablet Take 1 tablet (81 mg total) by mouth daily.   . Calcium Carbonate-Vitamin D 600-400 MG-UNIT tablet Take by mouth.   . carvedilol (COREG) 12.5 MG tablet Take 1 tablet (12.5 mg total) by mouth 2 (two) times daily with a meal.   . losartan (COZAAR) 25 MG tablet Take 25 mg by mouth daily.   . nitroGLYCERIN (NITROSTAT) 0.4 MG SL tablet Place 1 tablet (0.4 mg total) under the tongue every 5 (five) minutes as needed for chest pain. 03/25/2020: Expired needs new rx  . omega-3 acid ethyl esters (LOVAZA) 1 g capsule Take 1 capsule (1 g total) by mouth 2 (two) times daily. (Patient not taking: Reported on 04/01/2020)   . pantoprazole (PROTONIX) 40 MG tablet Take 1 tablet (40 mg total) by mouth daily. (Needs to be seen before next refill)   . ranolazine (RANEXA) 500 MG 12 hr tablet Take 500 mg by mouth 2 (two) times daily.   [name]  rivaroxaban (XARELTO) 20 MG TABS tablet Take 1 tablet (20 mg total) by mouth daily.   . rosuvastatin (CRESTOR) 40 MG tablet TAKE 1 TABLET (40 MG TOTAL) BY MOUTH DAILY. (NEEDS TO BE SEEN BEFORE NEXT REFILL)   . topiramate (TOPAMAX) 100 MG tablet Take 1 tablet (100 mg total) by mouth daily.   . traZODone (DESYREL) 150 MG tablet USE FROM 1/3 TO 1 TABLET NIGHTLY AS NEEDED FOR SLEEP. (Patient taking differently: Take 150 mg by mouth at bedtime.)   . valACYclovir (VALTREX) 500 MG tablet TAKE 1 TABLET BY MOUTH EVERY  DAY   . venlafaxine XR (EFFEXOR-XR) 75 MG 24 hr capsule Take 1 capsule (75 mg total) by mouth daily with breakfast.    No facility-administered encounter medications on file as of 04/14/2020.    Patient Active Problem List   Diagnosis Date Noted  . COPD with acute exacerbation (HCC) 03/25/2020  . Acute respiratory failure with hypoxia (HCC) 03/25/2020  . Gross hematuria 04/23/2019  . Microscopic hematuria 04/23/2019  . Insomnia 02/17/2019  . Opiate abuse, episodic (HCC) 12/17/2018  . Major depressive disorder, recurrent severe without psychotic features (HCC) 12/12/2018  . GERD (gastroesophageal reflux disease) 12/05/2018  . Closed trimalleolar fracture of ankle, right, initial encounter 10/18/2018  . Closed displaced trimalleolar fracture of right lower leg 10/15/2018  . Coronary artery disease involving native coronary artery of native heart 08/26/2018  . Morbid obesity (HCC) 08/26/2018  . NSVT (nonsustained ventricular tachycardia) (HCC) 06/18/2018  . Ischemic cardiomyopathy 03/13/2018  . S/P coronary artery stent placement 03/13/2018  . Dyslipidemia 08/02/2017  . Peripheral arterial disease (HCC) 06/06/2017  . CAD S/P percutaneous coronary angioplasty 08/25/2016  . Spondylolisthesis of lumbosacral region 01/14/2014  . Arthritis 04/05/2012  . History of DVT (deep vein thrombosis) 02/02/2012  . 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 Factor V Leiden (HCC) 03/21/2011  . History of CVA (cerebrovascular accident) 03/21/2011    Conditions to be addressed/monitored: Monitor client completion of daily ADLs  Care Plan : LCSW Care Plan  Updates made by Isaiah Blakes, LCSW since 04/14/2020 12:00 AM    Problem: Coping Skills (General Plan of Care)     Goal: Complete ADLs daily, as able   Start Date: 04/14/2020  Expected End Date: 07/15/2020  This Visit's Progress: On track  Priority: Medium  Note:   Current barriers:   . Patient in need of assistance with connecting to  community resources for possible help with completing daily ADLs . Patient is unable to independently navigate community resource options without care coordination support . Breathing issues occasionally . Mobility issues  Clinical Goals:  patient will attend all scheduled medical appointments  in next 30 days as evidenced by patient report and care team review of appointment completion in EMR: Patient will communicate with LCSW in next 30 days to discuss client completion of ADLs and daily activities Patient will call RNCM or LCSW in next 30 days as needed for CCM support   Clinical Interventions:  . Collaboration with [name], FNP regarding development and update of comprehensive plan of care as evidenced by provider attestation and co-signature . Assessment of needs, barriers of client . Talked with client about mobility issues of client . Talked with client about pain issues of client . Talked with client about breathing issues of client . Talked with client about medication procurement of client . Talked with client about her social work needs . Encouraged client or her children to call RNCM  or  LCSW as needed for CCM support  Patient Goals/Self-Care Activities: Over the next 30 days . Patient will attend scheduled medical appointments . Take prescribed medications as ordered . Communicate regularly with son or daughter about her daily needs  Patient Deficits:  Mobility issues Breathing challenges  Patient Goals:  Communicate with son or daughter regularly in next 30 days about her ongoing needs Attend scheduled medical appointments in next 30 days Allow time for rest, relaxation and involvement in hobbies in next 30 days  Follow Up Plan: LCSW to call client on 05/19/20 to assess client needs     [name].Ilanna Deihl MSW, LCSW Licensed Clinical Social Worker [facility] Care Management [phone]

## 2020-04-14 NOTE — Patient Instructions (Signed)
Visit Information  PATIENT GOALS: Goals Addressed            This Visit's Progress   . complete ADLs daily , as able       Timeframe:  Short-Term Goal Priority:  Medium Progress: On Track Start Date:             04/14/20                Expected End Date:     07/15/20             Follow Up Date 05/19/20    Protect My Health (Patient) Complete ADLs daily as able    Why is this important?    Screening tests can find diseases early when they are easier to treat.   Your doctor or nurse will talk with you about which tests are important for you.   Getting shots for common diseases like the flu and shingles will help prevent them.      Patient Goals/Self-Care Activities: Over the next 30 days . Patient will attend scheduled medical appointments . Take prescribed medications as ordered . Communicate regularly with son or daughter about her daily needs  Patient Deficits:  Mobility issues Breathing challenges  Patient Goals:  Communicate with son or daughter regularly in next 30 days about her ongoing needs Attend scheduled medical appointments in next 30 days Allow time for rest, relaxation and involvement in hobbies in next 30 days  Follow Up Plan: LCSW to call client on 05/19/20 to assess client needs       [name].Calee Nugent MSW, LCSW Licensed Clinical Social Worker [facility] Care Management [phone]

## 2020-04-22 ENCOUNTER — Ambulatory Visit (INDEPENDENT_AMBULATORY_CARE_PROVIDER_SITE_OTHER): Payer: Medicare HMO

## 2020-04-22 VITALS — Ht 68.0 in | Wt 223.0 lb

## 2020-04-22 DIAGNOSIS — Z0000 Encounter for general adult medical examination without abnormal findings: Secondary | ICD-10-CM

## 2020-04-22 DIAGNOSIS — Z599 Problem related to housing and economic circumstances, unspecified: Secondary | ICD-10-CM

## 2020-04-22 NOTE — Progress Notes (Signed)
Subjective:   Lindsay Bradford is a 59 y.o. female who presents for Medicare Annual (Subsequent) preventive examination.  Virtual Visit via Telephone Note  I connected with  Lindsay Bradford on 04/22/20 at 10:30 AM EDT by telephone and verified that I am speaking with the correct person using two identifiers.  Location: Patient: Home Provider: [facility] Persons participating in the virtual visit: patient/Nurse Health Advisor   I discussed the limitations, risks, security and privacy concerns of performing an evaluation and management service by telephone and the availability of in person appointments. The patient expressed understanding and agreed to proceed.  Interactive audio and video telecommunications were attempted between this nurse and patient, however failed, due to patient having technical difficulties OR patient did not have access to video capability.  We continued and completed visit with audio only.  Some vital signs may be absent or patient reported.   Lindsay Bradford E Zackory Pudlo, LPN   Review of Systems     Cardiac Risk Factors include: advanced age (>70men, >40 women);smoking/ tobacco exposure;sedentary lifestyle;dyslipidemia;obesity (BMI >30kg/m2)     Objective:    Today's Vitals   04/22/20 1107  Weight: 223 lb (101.2 kg)  Height: 5\' 8"  (1.727 m)  PainSc: 10-Worst pain ever   Body mass index is 33.91 kg/m.  Advanced Directives 04/22/2020 03/25/2020 03/25/2020 12/10/2018 12/06/2018 10/24/2018 10/18/2018  Does Patient Have a Medical Advance Directive? No Yes No No No No No  Type of Advance Directive - Healthcare Power of Attorney - - - - -  Does patient want to make changes to medical advance directive? - No - Patient declined - - - - -  Copy of Healthcare Power of Attorney in Chart? - No - copy requested - - - - -  Would patient like information on creating a medical advance directive? Yes (MAU/Ambulatory/Procedural Areas - Information given) No - Patient declined No - Patient  declined - - No - Patient declined No - Patient declined  Some encounter information is confidential and restricted. Go to Review Flowsheets activity to see all data.    Current Medications (verified) Outpatient Encounter Medications as of 04/22/2020  Medication Sig  . albuterol (VENTOLIN HFA) 108 (90 Base) MCG/ACT inhaler Inhale 1-2 puffs into the lungs every 6 (six) hours as needed for wheezing or shortness of breath.  06/22/2020 aspirin EC 81 MG EC tablet Take 1 tablet (81 mg total) by mouth daily.  . Calcium Carbonate-Vitamin D 600-400 MG-UNIT tablet Take by mouth.  . omega-3 acid ethyl esters (LOVAZA) 1 g capsule Take 1 capsule (1 g total) by mouth 2 (two) times daily.  . pantoprazole (PROTONIX) 40 MG tablet Take 1 tablet (40 mg total) by mouth daily. (Needs to be seen before next refill)  . ranolazine (RANEXA) 500 MG 12 hr tablet Take 500 mg by mouth 2 (two) times daily.  . rivaroxaban (XARELTO) 20 MG TABS tablet Take 1 tablet (20 mg total) by mouth daily.  . rosuvastatin (CRESTOR) 40 MG tablet TAKE 1 TABLET (40 MG TOTAL) BY MOUTH DAILY. (NEEDS TO BE SEEN BEFORE NEXT REFILL)  . topiramate (TOPAMAX) 100 MG tablet Take 1 tablet (100 mg total) by mouth daily.  . traZODone (DESYREL) 150 MG tablet USE FROM 1/3 TO 1 TABLET NIGHTLY AS NEEDED FOR SLEEP. (Patient taking differently: Take 150 mg by mouth at bedtime.)  . valACYclovir (VALTREX) 500 MG tablet TAKE 1 TABLET BY MOUTH EVERY DAY  . venlafaxine XR (EFFEXOR-XR) 75 MG 24 hr capsule Take 1 capsule (75 mg  total) by mouth daily with breakfast.  . nitroGLYCERIN (NITROSTAT) 0.4 MG SL tablet Place 1 tablet (0.4 mg total) under the tongue every 5 (five) minutes as needed for chest pain. (Patient not taking: Reported on 04/22/2020)  . [DISCONTINUED] carvedilol (COREG) 12.5 MG tablet Take 1 tablet (12.5 mg total) by mouth 2 (two) times daily with a meal. (Patient not taking: Reported on 04/22/2020)  . [DISCONTINUED] losartan (COZAAR) 25 MG tablet Take 25 mg by mouth  daily. (Patient not taking: Reported on 04/22/2020)   No facility-administered encounter medications on file as of 04/22/2020.    Allergies (verified) Cephalexin, Clarithromycin, Ibuprofen, Other, Oxymorphone, Penicillins, Potassium chloride, Sulfa antibiotics, Clindamycin, Fentanyl, Naproxen, Iodinated diagnostic agents, Lyrica [pregabalin], and Tetracycline   History: Past Medical History:  Diagnosis Date  . Arthritis   . Asthma   . Bursitis of left hip   . COPD (chronic obstructive pulmonary disease) (HCC)   . Coronary artery disease   . Depression   . DVT (deep venous thrombosis) (HCC)    left  . Factor V Leiden (HCC)   . Fibromyalgia   . GERD (gastroesophageal reflux disease)   . Headache    sesaonal  . Herpes simplex type 2 infection   . History of blood transfusion   . Hyperlipidemia   . Hypotension    90's, Normally; in pain normal; BP  . Ischemic cardiomyopathy   . MI (myocardial infarction) (HCC)    x 2 2000   . Pneumonia   . Stroke (HCC) 02/27/2018   no residual   Past Surgical History:  Procedure Laterality Date  . ABDOMINAL HYSTERECTOMY    . APPENDECTOMY    . CARDIAC CATHETERIZATION    . COLONOSCOPY    . FOOT SURGERY Bilateral   . LOOP RECORDER IMPLANT  06/20/2018   [facility]   . ORIF ANKLE FRACTURE Right 10/18/2018   Procedure: OPEN REDUCTION INTERNAL FIXATION (ORIF) ANKLE FRACTURE;  Surgeon: Roby LoftsHaddix, Kevin P, MD;  Location: [facility] OR;  Service: Orthopedics;  Laterality: Right;  . SHOULDER SURGERY Right   . VEIN BYPASS SURGERY Left    Family History  Problem Relation Age of Onset  . Arthritis Mother   . Cancer Mother   . Colon cancer Mother   . Breast cancer Mother   . COPD Father   . COPD Brother   . Heart disease Sister   . COPD Brother   . Stroke Brother   . Heart disease Brother   . Clotting disorder Brother   . Colon cancer Maternal Grandmother   . Cancer Maternal Grandmother   . Breast cancer Maternal Grandmother   . Cancer Paternal Grandmother    . Colon cancer Paternal Grandmother   . Cancer Nephew   . Colon cancer Nephew    Social History   Socioeconomic History  . Marital status: Single    Spouse name: Not on file  . Number of children: 2  . Years of education: Not on file  . Highest education level: Some college, no degree  Occupational History  . Occupation: Disabled  Tobacco Use  . Smoking status: Heavy Tobacco Smoker    Packs/day: 0.50    Years: 35.00    Pack years: 17.50    Types: Cigarettes  . Smokeless tobacco: Never Used  Vaping Use  . Vaping Use: Some days  . Substances: Flavoring  Substance and Sexual Activity  . Alcohol use: Not Currently  . Drug use: Not Currently  . Sexual activity: Not Currently  Other  Topics Concern  . Not on file  Social History Narrative   Her son lives with her in section 8 housing - she struggles to pay her rent and utilities - 04/22/20   Social Determinants of Health   Financial Resource Strain: High Risk  . Difficulty of Paying Living Expenses: Very hard  Food Insecurity: No Food Insecurity  . Worried About Programme researcher, broadcasting/film/video in the Last Year: Never true  . Ran Out of Food in the Last Year: Never true  Transportation Needs: Unmet Transportation Needs  . Lack of Transportation (Medical): Yes  . Lack of Transportation (Non-Medical): No  Physical Activity: Inactive  . Days of Exercise per Week: 0 days  . Minutes of Exercise per Session: 0 min  Stress: Stress Concern Present  . Feeling of Stress : Rather much  Social Connections: Socially Isolated  . Frequency of Communication with Friends and Family: More than three times a week  . Frequency of Social Gatherings with Friends and Family: More than three times a week  . Attends Religious Services: Never  . Active Member of Clubs or Organizations: No  . Attends Banker Meetings: Never  . Marital Status: Divorced    Tobacco Counseling Ready to quit: No Counseling given: Yes   Clinical  Intake:  Pre-visit preparation completed: Yes  Pain : 0-10 Pain Score: 10-Worst pain ever Pain Type: Acute pain,Intractable pain Pain Location: Head Pain Descriptors / Indicators: Headache Pain Onset: 1 to 4 weeks ago Pain Frequency: Constant     BMI - recorded: 33.91 Nutritional Status: BMI > 30  Obese Nutritional Risks: None Diabetes: No  How often do you need to have someone help you when you read instructions, pamphlets, or other written materials from your doctor or pharmacy?: 1 - Never  Diabetic? No  Interpreter Needed?: No  Information entered by :: Mack Thurmon, LPN   Activities of Daily Living In your present state of health, do you have any difficulty performing the following activities: 04/22/2020 03/25/2020  Hearing? N N  Vision? N N  Difficulty concentrating or making decisions? N N  Walking or climbing stairs? Y N  Dressing or bathing? N N  Doing errands, shopping? N N  Preparing Food and eating ? N -  Using the Toilet? N -  In the past six months, have you accidently leaked urine? N -  Do you have problems with loss of bowel control? N -  Managing your Medications? N -  Managing your Finances? N -  Housekeeping or managing your Housekeeping? N -  Some recent data might be hidden    Patient Care Team: [name], FNP as PCP - General (Family Medicine) Randa Spike, [name], LCSW as Social Worker (Licensed Clinical Social Worker)  Indicate any recent [employer] you may have received from other than [facility] providers in the past year (date may be approximate).     Assessment:   This is a routine wellness examination for [facility].  Hearing/Vision screen  Hearing Screening   125Hz  250Hz  500Hz  1000Hz  2000Hz  3000Hz  4000Hz  6000Hz  8000Hz   Right ear:           Left ear:           Comments: Has ringing in her ears. No complaints of hearing loss  Vision Screening Comments: Feels like vision is worsening - Annual visits with [facility] in - past due  for eye exam  Dietary issues and exercise activities discussed: Current Exercise Habits: The patient  does not participate in regular exercise at present, Exercise limited by: respiratory conditions(s);cardiac condition(s)  Goals    .  AWV      09/27/2018 AWV Goal: Exercise for General Health   Patient will verbalize understanding of the benefits of increased physical activity:  Exercising regularly is important. It will improve your overall fitness, flexibility, and endurance.  Regular exercise also will improve your overall health. It can help you control your weight, reduce stress, and improve your bone density.  Over the next year, patient will increase physical activity as tolerated with a goal of at least 150 minutes of moderate physical activity per week.   You can tell that you are exercising at a moderate intensity if your heart starts beating faster and you start breathing faster but can still hold a conversation.  Moderate-intensity exercise ideas include:  Walking 1 mile (1.6 km) in about 15 minutes  Biking  Hiking  Golfing  Dancing  Water aerobics  Patient will verbalize understanding of everyday activities that increase physical activity by providing examples like the following: ? Yard work, such as: ? Pushing a Surveyor, mining ? Raking and bagging leaves ? Washing your car ? Pushing a stroller ? Shoveling snow ? Gardening ? Washing windows or floors  Patient will be able to explain general safety guidelines for exercising:   Before you start a new exercise program, talk with your health care provider.  Do not exercise so much that you hurt yourself, feel dizzy, or get very short of breath.  Wear comfortable clothes and wear shoes with good support.  Drink plenty of water while you exercise to prevent dehydration or heat stroke.  Work out until your breathing and your heartbeat get faster.     .  Client will talk with LCSW in next 30 days to discuss  community resources of help in area (pt-stated)      Current Barriers:  [name] Mobility challenges (uses wheelchair to ambulate) in patient with chronic diagnoses of GERD, Major Depressive Disorder, Hx DVT, Hx CVA, COPD and CAD . Needs help with ADLs  Clinical Social Work Clinical Goal(s):  . Client and LCSW to talk in next 30 days to discuss community resources of help to client  Interventions: . Talked with Ardelia about CCM program and services . Talked with Siyona about her recent surgery on right ankle . Talked with Almer about her mobility challenges (uses wheelchair) . Talked with Alenna about in home support services through [employer] (LCSW provided [place] with number for [employer]) . Talked with Shakinah about transport needs of client . Talked with Azrael about social support network (receives support from her son, Madelaine Bhat,; receives support from grandson, and receives some help from her daughter)   Patient Self Care Activities:  . Takes medications as prescribed . Attends scheduled medical appointments   Plan:  LCSW to call client in next 3 weeks to talk with her about community resources of help to client Client to attends scheduled medical appointments Client to communicate with RNCM as needed to discuss nursing needs Client to participate in physical therapy sessions as scheduled .   Initial Goal Documentation      .  complete ADLs daily , as able      Timeframe:  Short-Term Goal Priority:  Medium Progress: On Track Start Date:             04/14/20                Expected End  Date:     07/15/20             Follow Up Date 05/19/20    Protect My Health (Patient) Complete ADLs daily as able    Why is this important?    Screening tests can find diseases early when they are easier to treat.   Your doctor or nurse will talk with you about which tests are important for you.   Getting shots for common diseases like the flu and shingles will help prevent them.      Patient Goals/Self-Care  Activities: Over the next 30 days . Patient will attend scheduled medical appointments . Take prescribed medications as ordered . Communicate regularly with son or daughter about her daily needs  Patient Deficits:  Mobility issues Breathing challenges  Patient Goals:  Communicate with son or daughter regularly in next 30 days about her ongoing needs Attend scheduled medical appointments in next 30 days Allow time for rest, relaxation and involvement in hobbies in next 30 days  Follow Up Plan: LCSW to call client on 05/19/20 to assess client needs     .  Quit Smoking      She quit cigarettes 04/21/20 - Hopes to not pick it back up Started vaping 04/22/20 - Hopes to quit within the next 6 months.      Depression Screen PHQ 2/9 Scores 04/22/2020 02/17/2019 02/05/2019 01/20/2019 11/19/2018 10/30/2018 09/27/2018  PHQ - 2 Score 4 6 0 0 6 2 2   PHQ- 9 Score 10 15 - - 18 7 10     Fall Risk Fall Risk  04/22/2020 03/25/2020 02/17/2019 02/05/2019 01/20/2019  Falls in the past year? 1 1 1  0 1  Number falls in past yr: 0 1 0 - 0  Comment poor balance - declines PT at this time - balance exercises provided - - - -  Injury with Fall? 0 1 1 - 1  Risk for fall due to : History of fall(s);Impaired balance/gait History of fall(s) - - History of fall(s);Impaired mobility;Other (Comment)  Risk for fall due to: Comment - - - - Ankle pain.  Follow up Falls prevention discussed;Education provided Education provided - - -    FALL RISK PREVENTION PERTAINING TO THE HOME:  Any stairs in or around the home? Yes  If so, are there any without handrails? No  Home free of loose throw rugs in walkways, pet beds, electrical cords, etc? Yes  Adequate lighting in your home to reduce risk of falls? Yes   ASSISTIVE DEVICES UTILIZED TO PREVENT FALLS:  Life alert? No  Use of a cane, walker or w/c? Yes  Grab bars in the bathroom? No  Shower chair or bench in shower? No  Elevated toilet seat or a handicapped toilet? No   TIMED  UP AND GO:  Was the test performed? No . Telephonic visit.  Cognitive Function:     6CIT Screen 09/27/2018  What Year? 0 points  What month? 0 points  What time? 0 points  Count back from 20 0 points  Months in reverse 0 points  Repeat phrase 0 points  Total Score 0    Immunizations Immunization History  Administered Date(s) Administered  . Influenza Split 12/02/2010  . Influenza, High Dose Seasonal PF 11/14/2016, 11/14/2016  . Influenza, Quadrivalent, Recombinant, Inj, Pf 10/18/2017  . Influenza, Seasonal, Injecte, Preservative Fre 10/29/2012, 10/10/2013, 10/13/2014, 02/04/2016, 11/14/2016, 10/18/2017  . Influenza,inj,Quad PF,6+ Mos 10/13/2014, 02/04/2016, 10/18/2017, 10/19/2018, 10/31/2019  . Influenza,inj,quad, With Preservative 11/14/2016  . Influenza-Unspecified  12/02/2010, 10/29/2012, 10/10/2013, 10/13/2014, 02/04/2016, 11/14/2016  . PFIZER(Purple Top)SARS-COV-2 Vaccination 10/31/2019, 11/21/2019  . Pneumococcal Polysaccharide-23 12/04/2016  . Pneumococcal-Unspecified 12/04/2016  . Tdap 12/17/2018    TDAP status: Up to date  Flu Vaccine status: Up to date  Pneumococcal vaccine status: Up to date  Covid-19 vaccine status: Completed vaccines  Qualifies for Shingles Vaccine? Yes   Zostavax completed No   Shingrix Completed?: No.    Education has been provided regarding the importance of this vaccine. Patient has been advised to call insurance company to determine out of pocket expense if they have not yet received this vaccine. Advised may also receive vaccine at local pharmacy or Health Dept. Verbalized acceptance and understanding.  Screening Tests Health Maintenance  Topic Date Due  . MAMMOGRAM  03/25/2021 (Originally 02/20/2019)  . COVID-19 Vaccine (3 - Booster for Pfizer series) 05/20/2020  . INFLUENZA VACCINE  08/16/2020  . Fecal DNA (Cologuard)  02/11/2022  . TETANUS/TDAP  12/16/2028  . Hepatitis C Screening  Completed  . HIV Screening  Completed  . HPV  VACCINES  Aged Out    Health Maintenance  There are no preventive care reminders to display for this patient.  Colorectal cancer screening: Type of screening: Cologuard. Completed 02/06/2011. Repeat every 3 years  Mammogram status: Completed 03/06/2017. Repeat every year  Bone Density Scan: Due at age 63  Lung Cancer Screening: (Low Dose CT Chest recommended if Age 18-80 years, 30 pack-year currently smoking OR have quit w/in 15years.) does qualify.   Lung Cancer Screening Referral: Patient declines at this time  Additional Screening:  Hepatitis C Screening: does qualify; Completed 01/03/2018  Vision Screening: Recommended annual ophthalmology exams for early detection of glaucoma and other disorders of the eye. Is the patient up to date with their annual eye exam?  No  Who is the provider or what is the name of the office in which the patient attends annual eye exams? Dr Conley Rolls in [place] If pt is not established with a provider, would they like to be referred to a provider to establish care? No .   Dental Screening: Recommended annual dental exams for proper oral hygiene  Community Resource Referral / Chronic Care Management: CRR required this visit?  Yes   CCM required this visit?  Yes      Plan:     I have personally reviewed and noted the following in the patient's chart:   . Medical and social history . Use of alcohol, tobacco or illicit drugs  . Current medications and supplements . Functional ability and status . Nutritional status . Physical activity . Advanced directives . List of other physicians . Hospitalizations, surgeries, and ER visits in previous 12 months . Vitals . Screenings to include cognitive, depression, and falls . Referrals and appointments  In addition, I have reviewed and discussed with patient certain preventive protocols, quality metrics, and best practice recommendations. A written personalized care plan for preventive services as well as  general preventive health recommendations were provided to patient.     [place] Constable, LPN   01/19/863   Nurse Notes: Referred for CCM as she is having trouble paying for housing and utilities; Also having worsening headaches and depression, no thoughts of harming herself or others - Made first available appt with PCP.

## 2020-04-22 NOTE — Patient Instructions (Addendum)
Lindsay Bradford , Thank you for taking time to come for your Medicare Wellness Visit. I appreciate your ongoing commitment to your health goals. Please review the following plan we discussed and let me know if I can assist you in the future.   Screening recommendations/referrals: Colonoscopy: Cologuard done 02/06/2019 - Repeat in 3 years Mammogram: Done 03/01/2017 - Due (patient postponed for now) Bone Density: Due at age 60 Recommended yearly ophthalmology/optometry visit for glaucoma screening and checkup Recommended yearly dental visit for hygiene and checkup  Vaccinations: Influenza vaccine: Done 10/31/19 - repeat annually Pneumococcal vaccine: Done 12/04/2016 (need to verify type and dates) Tdap vaccine: Done 12/17/2018 - Repeat in 10 years Shingles vaccine: Shingrix discussed. Please contact your pharmacy for coverage information.   Covid-19: Done 10/31/19 and 11/21/2019  Advanced directives: Advance directive discussed with you today. I have provided a copy for you to complete at home and have notarized. Once this is complete please bring a copy in to our office so we can scan it into your chart.  Conditions/risks identified: Continue working on preventing falls, increasing physical activity, drink 6-8 glasses of water daily. I have included fall prevention tips and exercises.  Next appointment: Follow up in one year for your annual wellness visit.   Preventive Care 40-64 Years, Female Preventive care refers to lifestyle choices and visits with your health care provider that can promote health and wellness. What does preventive care include?  A yearly physical exam. This is also called an annual well check.  Dental exams once or twice a year.  Routine eye exams. Ask your health care provider how often you should have your eyes checked.  Personal lifestyle choices, including:  Daily care of your teeth and gums.  Regular physical activity.  Eating a healthy diet.  Avoiding  tobacco and drug use.  Limiting alcohol use.  Practicing safe sex.  Taking low-dose aspirin daily starting at age 82.  Taking vitamin and mineral supplements as recommended by your health care provider. What happens during an annual well check? The services and screenings done by your health care provider during your annual well check will depend on your age, overall health, lifestyle risk factors, and family history of disease. Counseling  Your health care provider may ask you questions about your:  Alcohol use.  Tobacco use.  Drug use.  Emotional well-being.  Home and relationship well-being.  Sexual activity.  Eating habits.  Work and work Statistician.  Method of birth control.  Menstrual cycle.  Pregnancy history. Screening  You may have the following tests or measurements:  Height, weight, and BMI.  Blood pressure.  Lipid and cholesterol levels. These may be checked every 5 years, or more frequently if you are over 69 years old.  Skin check.  Lung cancer screening. You may have this screening every year starting at age 59 if you have a 30-pack-year history of smoking and currently smoke or have quit within the past 15 years.  Fecal occult blood test (FOBT) of the stool. You may have this test every year starting at age 43.  Flexible sigmoidoscopy or colonoscopy. You may have a sigmoidoscopy every 5 years or a colonoscopy every 10 years starting at age 84.  Hepatitis C blood test.  Hepatitis B blood test.  Sexually transmitted disease (STD) testing.  Diabetes screening. This is done by checking your blood sugar (glucose) after you have not eaten for a while (fasting). You may have this done every 1-3 years.  Mammogram. This may  be done every 1-2 years. Talk to your health care provider about when you should start having regular mammograms. This may depend on whether you have a family history of breast cancer.  BRCA-related cancer screening. This may  be done if you have a family history of breast, ovarian, tubal, or peritoneal cancers.  Pelvic exam and Pap test. This may be done every 3 years starting at age 53. Starting at age 41, this may be done every 5 years if you have a Pap test in combination with an HPV test.  Bone density scan. This is done to screen for osteoporosis. You may have this scan if you are at high risk for osteoporosis. Discuss your test results, treatment options, and if necessary, the need for more tests with your health care provider. Vaccines  Your health care provider may recommend certain vaccines, such as:  Influenza vaccine. This is recommended every year.  Tetanus, diphtheria, and acellular pertussis (Tdap, Td) vaccine. You may need a Td booster every 10 years.  Zoster vaccine. You may need this after age 67.  Pneumococcal 13-valent conjugate (PCV13) vaccine. You may need this if you have certain conditions and were not previously vaccinated.  Pneumococcal polysaccharide (PPSV23) vaccine. You may need one or two doses if you smoke cigarettes or if you have certain conditions. Talk to your health care provider about which screenings and vaccines you need and how often you need them. This information is not intended to replace advice given to you by your health care provider. Make sure you discuss any questions you have with your health care provider. Document Released: 01/29/2015 Document Revised: 09/22/2015 Document Reviewed: 11/03/2014 Elsevier Interactive Patient Education  2017 [place] Prevention in the Home Falls can cause injuries. They can happen to people of all ages. There are many things you can do to make your home safe and to help prevent falls. What can I do on the outside of my home?  Regularly fix the edges of walkways and driveways and fix any cracks.  Remove anything that might make you trip as you walk through a door, such as a raised step or threshold.  Trim any  bushes or trees on the path to your home.  Use bright outdoor lighting.  Clear any walking paths of anything that might make someone trip, such as rocks or tools.  Regularly check to see if handrails are loose or broken. Make sure that both sides of any steps have handrails.  Any raised decks and porches should have guardrails on the edges.  Have any leaves, snow, or ice cleared regularly.  Use sand or salt on walking paths during winter.  Clean up any spills in your garage right away. This includes oil or grease spills. What can I do in the bathroom?  Use night lights.  Install grab bars by the toilet and in the tub and shower. Do not use towel bars as grab bars.  Use non-skid mats or decals in the tub or shower.  If you need to sit down in the shower, use a plastic, non-slip stool.  Keep the floor dry. Clean up any water that spills on the floor as soon as it happens.  Remove soap buildup in the tub or shower regularly.  Attach bath mats securely with double-sided non-slip rug tape.  Do not have throw rugs and other things on the floor that can make you trip. What can I do in the bedroom?  Use night  lights.  Make sure that you have a light by your bed that is easy to reach.  Do not use any sheets or blankets that are too big for your bed. They should not hang down onto the floor.  Have a firm chair that has side arms. You can use this for support while you get dressed.  Do not have throw rugs and other things on the floor that can make you trip. What can I do in the kitchen?  Clean up any spills right away.  Avoid walking on wet floors.  Keep items that you use a lot in easy-to-reach places.  If you need to reach something above you, use a strong step stool that has a grab bar.  Keep electrical cords out of the way.  Do not use floor polish or wax that makes floors slippery. If you must use wax, use non-skid floor wax.  Do not have throw rugs and other  things on the floor that can make you trip. What can I do with my stairs?  Do not leave any items on the stairs.  Make sure that there are handrails on both sides of the stairs and use them. Fix handrails that are broken or loose. Make sure that handrails are as long as the stairways.  Check any carpeting to make sure that it is firmly attached to the stairs. Fix any carpet that is loose or worn.  Avoid having throw rugs at the top or bottom of the stairs. If you do have throw rugs, attach them to the floor with carpet tape.  Make sure that you have a light switch at the top of the stairs and the bottom of the stairs. If you do not have them, ask someone to add them for you. What else can I do to help prevent falls?  Wear shoes that:  Do not have high heels.  Have rubber bottoms.  Are comfortable and fit you well.  Are closed at the toe. Do not wear sandals.  If you use a stepladder:  Make sure that it is fully opened. Do not climb a closed stepladder.  Make sure that both sides of the stepladder are locked into place.  Ask someone to hold it for you, if possible.  Clearly mark and make sure that you can see:  Any grab bars or handrails.  First and last steps.  Where the edge of each step is.  Use tools that help you move around (mobility aids) if they are needed. These include:  Canes.  Walkers.  Scooters.  Crutches.  Turn on the lights when you go into a dark area. Replace any light bulbs as soon as they burn out.  Set up your furniture so you have a clear path. Avoid moving your furniture around.  If any of your floors are uneven, fix them.  If there are any pets around you, be aware of where they are.  Review your medicines with your doctor. Some medicines can make you feel dizzy. This can increase your chance of falling. Ask your doctor what other things that you can do to help prevent falls. This information is not intended to replace advice given to  you by your health care provider. Make sure you discuss any questions you have with your health care provider. Document Released: 10/29/2008 Document Revised: 06/10/2015 Document Reviewed: 02/06/2014 Elsevier Interactive Patient Education  2017 [employer].

## 2020-04-23 ENCOUNTER — Other Ambulatory Visit: Payer: Self-pay

## 2020-04-23 ENCOUNTER — Telehealth: Payer: Self-pay | Admitting: Family

## 2020-04-23 DIAGNOSIS — I2510 Atherosclerotic heart disease of native coronary artery without angina pectoris: Secondary | ICD-10-CM

## 2020-04-23 DIAGNOSIS — I739 Peripheral vascular disease, unspecified: Secondary | ICD-10-CM

## 2020-04-23 DIAGNOSIS — Z8673 Personal history of transient ischemic attack (TIA), and cerebral infarction without residual deficits: Secondary | ICD-10-CM

## 2020-04-23 DIAGNOSIS — K219 Gastro-esophageal reflux disease without esophagitis: Secondary | ICD-10-CM

## 2020-04-23 DIAGNOSIS — J441 Chronic obstructive pulmonary disease with (acute) exacerbation: Secondary | ICD-10-CM

## 2020-04-23 DIAGNOSIS — E785 Hyperlipidemia, unspecified: Secondary | ICD-10-CM

## 2020-04-23 NOTE — Telephone Encounter (Signed)
   Telephone encounter was:  Successful.  04/23/2020 Name: Lindsay Bradford MRN: [id number] DOB: [birth year]  Lindsay Bradford is a 60 y.o. year old female who is a primary care patient of [name], FNP . The community resource team was consulted for assistance with Food Insecurity and Financial Difficulties related to needing help for a deposit to help her move into a new place. Since she is [place] she will need to contact her social worker at the DSS to see where her voucher can take her and her son. She needs a list of local food banks in the area (she has food stamps but runs out of food each month). She will also need help with deposit help.   Care guide performed the following interventions: Patient provided with information about care guide support team and interviewed to confirm resource needs Discussed resources to assist with please see above. .  Follow Up Plan:  Care guide will follow up with patient by phone over the next week and Care guide will outreach resources to assist patient with finding resources to help her pay for deposit to a new home.   Rojelio Brenner Care Guide, Embedded Care Coordination [facility], Care Management Phone: [phone] Email: [email or web address]

## 2020-04-26 ENCOUNTER — Telehealth: Payer: Self-pay | Admitting: Family

## 2020-04-26 ENCOUNTER — Other Ambulatory Visit: Payer: Self-pay

## 2020-04-26 MED ORDER — PANTOPRAZOLE SODIUM 40 MG PO TBEC: 40.0000 mg | DELAYED_RELEASE_TABLET | Freq: Every day | ORAL | 5 refills | Status: DC

## 2020-04-26 MED ORDER — ALBUTEROL SULFATE HFA 108 (90 BASE) MCG/ACT IN AERS: 1.0000 | INHALATION_SPRAY | Freq: Four times a day (QID) | RESPIRATORY_TRACT | 1 refills | Status: AC | PRN

## 2020-04-26 MED ORDER — TOPIRAMATE 100 MG PO TABS: 100.0000 mg | ORAL_TABLET | Freq: Every day | ORAL | 2 refills | Status: DC

## 2020-04-26 MED ORDER — ROSUVASTATIN CALCIUM 40 MG PO TABS: 40.0000 mg | ORAL_TABLET | Freq: Every day | ORAL | 2 refills | Status: DC

## 2020-04-26 NOTE — Telephone Encounter (Signed)
   Telephone encounter was:  Successful.  04/26/2020 Name: Lindsay Bradford MRN: [id number] DOB: [birth year]  Lindsay Bradford is a 60 y.o. year old female who is a primary care patient of [name], FNP . The community resource team was consulted for assistance with Food Insecurity and Financial Difficulties related to the deposit fee for finding a new home.   Care guide performed the following interventions: Discussed resources to assist with finding food banks in the area, I emailed her the list and she received it. I let her know that at this time we do not have a program that pays for deposits into a new home or can help her pay to fix her car. She has no further needs at this time. .  Follow Up Plan:  No further follow up planned at this time. The patient has been provided with needed resources.  Rojelio Brenner Care Guide, Embedded Care Coordination [facility], Care Management Phone: [phone] Email: [email or web address]

## 2020-04-26 NOTE — Telephone Encounter (Signed)
Last office visit 03/25/20 Last refill 11/03/19, #90, 1 refill

## 2020-04-27 ENCOUNTER — Other Ambulatory Visit: Payer: Self-pay

## 2020-04-27 ENCOUNTER — Encounter: Payer: Self-pay | Admitting: Family

## 2020-04-27 ENCOUNTER — Ambulatory Visit (INDEPENDENT_AMBULATORY_CARE_PROVIDER_SITE_OTHER): Payer: Medicare HMO | Admitting: Family

## 2020-04-27 VITALS — BP 119/90 | HR 99 | Temp 97.0°F | Ht 68.0 in | Wt 218.0 lb

## 2020-04-27 DIAGNOSIS — M545 Low back pain, unspecified: Secondary | ICD-10-CM

## 2020-04-27 DIAGNOSIS — R519 Headache, unspecified: Secondary | ICD-10-CM | POA: Diagnosis not present

## 2020-04-27 DIAGNOSIS — F411 Generalized anxiety disorder: Secondary | ICD-10-CM | POA: Diagnosis not present

## 2020-04-27 DIAGNOSIS — F321 Major depressive disorder, single episode, moderate: Secondary | ICD-10-CM | POA: Diagnosis not present

## 2020-04-27 DIAGNOSIS — G8929 Other chronic pain: Secondary | ICD-10-CM

## 2020-04-27 MED ORDER — TRAZODONE HCL 150 MG PO TABS: ORAL_TABLET | ORAL | 0 refills | Status: DC

## 2020-04-27 MED ORDER — VENLAFAXINE HCL ER 150 MG PO CP24: 150.0000 mg | ORAL_CAPSULE | Freq: Every day | ORAL | 1 refills | Status: DC

## 2020-04-27 MED ORDER — BUSPIRONE HCL 5 MG PO TABS: 5.0000 mg | ORAL_TABLET | Freq: Two times a day (BID) | ORAL | 2 refills | Status: DC

## 2020-04-27 MED ORDER — TOPIRAMATE 100 MG PO TABS: 100.0000 mg | ORAL_TABLET | Freq: Two times a day (BID) | ORAL | 2 refills | Status: DC

## 2020-04-27 MED ORDER — VALACYCLOVIR HCL 500 MG PO TABS: 500.0000 mg | ORAL_TABLET | Freq: Every day | ORAL | 0 refills | Status: DC

## 2020-04-27 NOTE — Patient Instructions (Signed)
Major Depressive Disorder, Adult Major depressive disorder (MDD) is a mental health condition. It may also be called clinical depression or unipolar depression. MDD causes symptoms of sadness, hopelessness, and loss of interest in things. These symptoms last most of the day, almost every day, for 2 weeks. MDD can also cause physical symptoms. It can interfere with relationships and with everyday activities, such as work, school, and activities that are usually pleasant. MDD may be mild, moderate, or severe. It may be single-episode MDD, which happens once, or recurrent MDD, which may occur multiple times. What are the causes? The exact cause of this condition is not known. MDD is most likely caused by a combination of things, which may include:  Your personality traits.  Learned or conditioned behaviors or thoughts or feelings that reinforce negativity.  Any alcohol or substance misuse.  Long-term (chronic) physical or mental health illness.  Going through a traumatic experience or major life changes. What increases the risk? The following factors may make someone more likely to develop MDD:  A family history of depression.  Being a woman.  Troubled family relationships.  Abnormally low levels of certain brain chemicals.  Traumatic or painful events in childhood, especially abuse or loss of a parent.  A lot of stress from life experiences, such as poor living conditions or discrimination.  Chronic physical illness or other mental health disorders. What are the signs or symptoms? The main symptoms of MDD usually include:  Constant depressed or irritable mood.  A loss of interest in things and activities. Other symptoms include:  Sleeping or eating too much or too little.  Unexplained weight gain or weight loss.  Tiredness or low energy.  Being agitated, restless, or weak.  Feeling hopeless, worthless, or guilty.  Trouble thinking clearly or making  decisions.  Thoughts of suicide or thoughts of harming others.  Isolating oneself or avoiding other people or activities.  Trouble completing tasks, work, or any normal obligations. Severe symptoms of this condition may include:  Psychotic depression.This may include false beliefs, or delusions. It may also include seeing, hearing, tasting, smelling, or feeling things that are not real (hallucinations).  Chronic depression or persistent depressive disorder. This is low-level depression that lasts for at least 2 years.  Melancholic depression, or feeling extremely sad and hopeless.  Catatonic depression, which includes trouble speaking and trouble moving. How is this diagnosed? This condition may be diagnosed based on:  Your symptoms.  Your medical and mental health history. You may be asked questions about your lifestyle, including any drug and alcohol use.  A physical exam.  Blood tests to rule out other conditions. MDD is confirmed if you have the following symptoms most of the day, nearly every day, in a 2-week period:  Either a depressed mood or loss of interest.  At least four other MDD symptoms. How is this treated? This condition is usually treated by mental health professionals, such as psychologists, psychiatrists, and clinical social workers. You may need more than one type of treatment. Treatment may include:  Psychotherapy, also called talk therapy or counseling. Types of psychotherapy include: ? Cognitive behavioral therapy (CBT). This teaches you to recognize unhealthy feelings, thoughts, and behaviors, and replace them with positive thoughts and actions. ? Interpersonal therapy (IPT). This helps you to improve the way you communicate with others or relate to them. ? Family therapy. This treatment includes members of your family.  Medicines to treat anxiety and depression. These medicines help to balance the brain chemicals   that affect your emotions.  Lifestyle  changes. You may be asked to: ? Limit alcohol use and avoid drug use. ? Get regular exercise. ? Get plenty of sleep. ? Make healthy eating choices. ? Spend more time outdoors.  Brain stimulation. This may be done if symptoms are very severe and other treatments have not worked. Examples of this treatment are electroconvulsive therapy and transcranial magnetic stimulation. Follow these instructions at home: Activity  Exercise regularly and spend time outdoors.  Find activities that you enjoy doing, and make time to do them.  Find healthy ways to manage stress, such as: ? Meditation or deep breathing. ? Spending time in nature. ? Journaling.  Return to your normal activities as told by your health care provider. Ask your health care provider what activities are safe for you. Alcohol and drug use  If you drink alcohol: ? Limit how much you use to:  0-1 drink a day for women who are not pregnant.  0-2 drinks a day for men. ? Be aware of how much alcohol is in your drink. In the [place] one drink equals one 12 oz bottle of beer (355 mL), one 5 oz glass of wine (148 mL), or one 1 oz glass of hard liquor (44 mL). ? Discuss your alcohol use with your health care provider. Alcohol can affect any antidepressant medicines you are taking.  Discuss any drug use with your health care provider. General instructions  Take over-the-counter and prescription medicines only as told by your health care provider.  Eat a healthy diet and get plenty of sleep.  Consider joining a support group. Your health care provider may be able to recommend one.  Keep all follow-up visits as told by your health care provider. This is important.   Where to find more information  National Alliance on Mental Illness: www.nami.org  [place] National Institute of Mental Health: www.nimh.nih.gov Contact a health care provider if:  Your symptoms get worse.  You develop new symptoms. Get help right away if:  You  self-harm.  You have serious thoughts about hurting yourself or others.  You hallucinate. If you ever feel like you may hurt yourself or others, or have thoughts about taking your own life, get help right away. Go to your nearest emergency department or:  Call your local emergency services (911 in the [place]  Call a suicide crisis helpline, such as the National Suicide Prevention Lifeline at 1-800-273-8255. This is open 24 hours a day in the [place]  Text the Crisis Text Line at 741741 (in the [place] Summary  Major depressive disorder (MDD) is a mental health condition. MDD causes symptoms of sadness, hopelessness, and loss of interest in things. These symptoms last most of the day, almost every day, for 2 weeks.  The symptoms of MDD can interfere with relationships and with everyday activities.  Treatments and support are available for people who develop MDD. You may need more than one type of treatment.  Get help right away if you have serious thoughts about hurting yourself or others. This information is not intended to replace advice given to you by your health care provider. Make sure you discuss any questions you have with your health care provider. Document Revised: 12/14/2018 Document Reviewed: 12/14/2018 Elsevier Patient Education  2021 [employer]  

## 2020-04-27 NOTE — Progress Notes (Signed)
Subjective:    Patient ID: Lindsay Bradford, female    DOB: 01-29-1960, 60 y.o.   MRN: [id number]  Chief Complaint  Patient presents with  . Depression  . Headache   Pt presents to the office today for increased GAD and Depression. She is currently taking Effexor 75 mg daily.  Depression        This is a chronic problem.  The current episode started more than 1 year ago.   The onset quality is gradual.   The problem occurs intermittently.  Associated symptoms include fatigue, helplessness, hopelessness, irritable, restlessness, decreased interest, headaches and sad.  Associated symptoms include no suicidal ideas.  Past treatments include SNRIs - Serotonin and norepinephrine reuptake inhibitors.  Compliance with treatment is good.  Past medical history includes anxiety.   Headache  This is a new problem. The current episode started 1 to 4 weeks ago. The problem occurs constantly. The pain is located in the frontal region. The pain is at a severity of 10/10. The pain is mild. Associated symptoms include back pain, phonophobia and photophobia. Pertinent negatives include no blurred vision, ear pain, eye pain, nausea or sinus pressure. The symptoms are aggravated by weather changes (pollen). She has tried acetaminophen and beta blockers for the symptoms. The treatment provided mild relief.  Anxiety Presents for follow-up visit. Symptoms include depressed mood, excessive worry, irritability, nervous/anxious behavior and restlessness. Patient reports no nausea or suicidal ideas. Symptoms occur most days. The severity of symptoms is moderate. The quality of sleep is good.    Back Pain This is a chronic problem. The current episode started more than 1 year ago. The problem occurs intermittently. The pain is present in the lumbar spine. The pain is at a severity of 10/10. The pain is moderate. The symptoms are aggravated by bending. Associated symptoms include headaches. Risk factors include obesity. She has  tried bed rest for the symptoms. The treatment provided mild relief.      Review of Systems  Constitutional: Positive for fatigue and irritability.  HENT: Negative for ear pain and sinus pressure.   Eyes: Positive for photophobia. Negative for blurred vision and pain.  Gastrointestinal: Negative for nausea.  Musculoskeletal: Positive for back pain.  Neurological: Positive for headaches.  Psychiatric/Behavioral: Positive for depression. Negative for suicidal ideas. The patient is nervous/anxious.   All other systems reviewed and are negative.      Objective:   Physical Exam Vitals reviewed.  Constitutional:      General: She is irritable. She is not in acute distress.    Appearance: She is well-developed. She is obese.  HENT:     Head: Normocephalic and atraumatic.     Right Ear: External ear normal.  Eyes:     Pupils: Pupils are equal, round, and reactive to light.  Neck:     Thyroid: No thyromegaly.  Cardiovascular:     Rate and Rhythm: Normal rate and regular rhythm.     Heart sounds: Normal heart sounds. No murmur heard.   Pulmonary:     Effort: Pulmonary effort is normal. No respiratory distress.     Breath sounds: Normal breath sounds. No wheezing.  Abdominal:     General: Bowel sounds are normal. There is no distension.     Palpations: Abdomen is soft.     Tenderness: There is no abdominal tenderness.  Musculoskeletal:        General: No tenderness.     Cervical back: Normal range of motion and neck supple.  Comments: Pain in lumbar with flexion  Skin:    General: Skin is warm and dry.  Neurological:     Mental Status: She is alert and oriented to person, place, and time.     Cranial Nerves: No cranial nerve deficit.     Deep Tendon Reflexes: Reflexes are normal and symmetric.  Psychiatric:        Mood and Affect: Affect is flat.        Behavior: Behavior normal.        Thought Content: Thought content normal.        Judgment: Judgment normal.           BP 119/90   Pulse 99   Temp (!) 97 F (36.1 C) (Temporal)   Ht 5\' 8"  (1.727 m)   Wt 218 lb (98.9 kg)   SpO2 95%   BMI 33.15 kg/m   Assessment & Plan:  Lindsay Bradford comes in today with chief complaint of Depression and Headache   Diagnosis and orders addressed:  1. GAD (generalized anxiety disorder) Will increase Effexor to 150 mg from 75 mg Buspar 5 mg BID added today Stress management  Adverse effects discussed Referral to [facility] pending  - venlafaxine XR (EFFEXOR XR) 150 MG 24 hr capsule; Take 1 capsule (150 mg total) by mouth daily with breakfast.  Dispense: 90 capsule; Refill: 1 - busPIRone (BUSPAR) 5 MG tablet; Take 1 tablet (5 mg total) by mouth 2 (two) times daily.  Dispense: 60 tablet; Refill: 2 - Ambulatory referral to Psychiatry  2. Depression, major, single episode, moderate (HCC) - venlafaxine XR (EFFEXOR XR) 150 MG 24 hr capsule; Take 1 capsule (150 mg total) by mouth daily with breakfast.  Dispense: 90 capsule; Refill: 1 - busPIRone (BUSPAR) 5 MG tablet; Take 1 tablet (5 mg total) by mouth 2 (two) times daily.  Dispense: 60 tablet; Refill: 2 - Ambulatory referral to Psychiatry  3. Intractable headache, unspecified chronicity pattern, unspecified headache type Will increase Topamax to 100 mg BID from 100 mg daily - topiramate (TOPAMAX) 100 MG tablet; Take 1 tablet (100 mg total) by mouth 2 (two) times daily.  Dispense: 60 tablet; Refill: 2  4. Chronic bilateral low back pain without sciatica Rest ROM exercises encouraged   Follow up plan: 4 -6 weeks    [facility], FNP

## 2020-04-30 ENCOUNTER — Other Ambulatory Visit: Payer: Self-pay | Admitting: Family

## 2020-04-30 DIAGNOSIS — I739 Peripheral vascular disease, unspecified: Secondary | ICD-10-CM

## 2020-04-30 DIAGNOSIS — E785 Hyperlipidemia, unspecified: Secondary | ICD-10-CM

## 2020-04-30 DIAGNOSIS — I2510 Atherosclerotic heart disease of native coronary artery without angina pectoris: Secondary | ICD-10-CM

## 2020-04-30 DIAGNOSIS — Z8673 Personal history of transient ischemic attack (TIA), and cerebral infarction without residual deficits: Secondary | ICD-10-CM

## 2020-05-03 ENCOUNTER — Other Ambulatory Visit: Payer: Self-pay | Admitting: Family Medicine

## 2020-05-03 DIAGNOSIS — Z8673 Personal history of transient ischemic attack (TIA), and cerebral infarction without residual deficits: Secondary | ICD-10-CM

## 2020-05-03 DIAGNOSIS — I2510 Atherosclerotic heart disease of native coronary artery without angina pectoris: Secondary | ICD-10-CM

## 2020-05-03 DIAGNOSIS — E785 Hyperlipidemia, unspecified: Secondary | ICD-10-CM

## 2020-05-03 DIAGNOSIS — I739 Peripheral vascular disease, unspecified: Secondary | ICD-10-CM

## 2020-05-03 MED ORDER — ROSUVASTATIN CALCIUM 40 MG PO TABS: 40.0000 mg | ORAL_TABLET | Freq: Every day | ORAL | 1 refills | Status: DC

## 2020-05-08 ENCOUNTER — Other Ambulatory Visit: Payer: Self-pay | Admitting: Family

## 2020-05-08 DIAGNOSIS — I2510 Atherosclerotic heart disease of native coronary artery without angina pectoris: Secondary | ICD-10-CM

## 2020-05-08 DIAGNOSIS — E785 Hyperlipidemia, unspecified: Secondary | ICD-10-CM

## 2020-05-08 DIAGNOSIS — I739 Peripheral vascular disease, unspecified: Secondary | ICD-10-CM

## 2020-05-08 DIAGNOSIS — J439 Emphysema, unspecified: Secondary | ICD-10-CM | POA: Diagnosis not present

## 2020-05-08 DIAGNOSIS — R062 Wheezing: Secondary | ICD-10-CM | POA: Diagnosis not present

## 2020-05-08 DIAGNOSIS — Z8673 Personal history of transient ischemic attack (TIA), and cerebral infarction without residual deficits: Secondary | ICD-10-CM

## 2020-05-08 DIAGNOSIS — R0600 Dyspnea, unspecified: Secondary | ICD-10-CM | POA: Diagnosis not present

## 2020-05-08 DIAGNOSIS — R519 Headache, unspecified: Secondary | ICD-10-CM

## 2020-05-10 ENCOUNTER — Telehealth: Payer: Self-pay

## 2020-05-10 DIAGNOSIS — F321 Major depressive disorder, single episode, moderate: Secondary | ICD-10-CM

## 2020-05-10 DIAGNOSIS — F411 Generalized anxiety disorder: Secondary | ICD-10-CM

## 2020-05-10 MED ORDER — VENLAFAXINE HCL ER 150 MG PO CP24: 150.0000 mg | ORAL_CAPSULE | Freq: Every day | ORAL | 1 refills | Status: DC

## 2020-05-10 NOTE — Telephone Encounter (Signed)
Prescription for Effexor XR 150 mg. To [facility], patient aware

## 2020-05-16 DIAGNOSIS — J441 Chronic obstructive pulmonary disease with (acute) exacerbation: Secondary | ICD-10-CM | POA: Diagnosis not present

## 2020-05-16 DIAGNOSIS — J439 Emphysema, unspecified: Secondary | ICD-10-CM | POA: Diagnosis not present

## 2020-05-16 DIAGNOSIS — R062 Wheezing: Secondary | ICD-10-CM | POA: Diagnosis not present

## 2020-05-16 DIAGNOSIS — R0600 Dyspnea, unspecified: Secondary | ICD-10-CM | POA: Diagnosis not present

## 2020-05-18 ENCOUNTER — Encounter: Payer: Self-pay | Admitting: Family Medicine

## 2020-05-19 ENCOUNTER — Other Ambulatory Visit: Payer: Self-pay | Admitting: Family

## 2020-05-19 DIAGNOSIS — F411 Generalized anxiety disorder: Secondary | ICD-10-CM

## 2020-05-19 DIAGNOSIS — F321 Major depressive disorder, single episode, moderate: Secondary | ICD-10-CM

## 2020-05-28 DIAGNOSIS — R55 Syncope and collapse: Secondary | ICD-10-CM | POA: Diagnosis not present

## 2020-05-31 ENCOUNTER — Other Ambulatory Visit: Payer: Self-pay | Admitting: Family

## 2020-05-31 ENCOUNTER — Encounter: Payer: Self-pay | Admitting: Family

## 2020-05-31 ENCOUNTER — Other Ambulatory Visit: Payer: Self-pay

## 2020-05-31 ENCOUNTER — Ambulatory Visit (INDEPENDENT_AMBULATORY_CARE_PROVIDER_SITE_OTHER): Payer: Medicare HMO | Admitting: Family

## 2020-05-31 VITALS — BP 110/84 | HR 96 | Temp 98.0°F | Ht 68.0 in | Wt 215.0 lb

## 2020-05-31 DIAGNOSIS — Z0100 Encounter for examination of eyes and vision without abnormal findings: Secondary | ICD-10-CM | POA: Diagnosis not present

## 2020-05-31 DIAGNOSIS — E7800 Pure hypercholesterolemia, unspecified: Secondary | ICD-10-CM | POA: Diagnosis not present

## 2020-05-31 DIAGNOSIS — H5210 Myopia, unspecified eye: Secondary | ICD-10-CM | POA: Diagnosis not present

## 2020-05-31 DIAGNOSIS — F332 Major depressive disorder, recurrent severe without psychotic features: Secondary | ICD-10-CM | POA: Diagnosis not present

## 2020-05-31 DIAGNOSIS — G43009 Migraine without aura, not intractable, without status migrainosus: Secondary | ICD-10-CM | POA: Diagnosis not present

## 2020-05-31 DIAGNOSIS — F411 Generalized anxiety disorder: Secondary | ICD-10-CM | POA: Diagnosis not present

## 2020-05-31 MED ORDER — BUSPIRONE HCL 7.5 MG PO TABS: 7.5000 mg | ORAL_TABLET | Freq: Two times a day (BID) | ORAL | 2 refills | Status: DC

## 2020-05-31 NOTE — Patient Instructions (Signed)
[email or web address]">  Generalized Anxiety Disorder, Adult Generalized anxiety disorder (GAD) is a mental health condition. Unlike normal worries, anxiety related to GAD is not triggered by a specific event. These worries do not fade or get better with time. GAD interferes with relationships, work, and school. GAD symptoms can vary from mild to severe. People with severe GAD can have intense waves of anxiety with physical symptoms that are similar to panic attacks. What are the causes? The exact cause of GAD is not known, but the following are believed to have an impact:  Differences in natural brain chemicals.  Genes passed down from parents to children.  Differences in the way threats are perceived.  Development during childhood.  Personality. What increases the risk? The following factors may make you more likely to develop this condition:  Being female.  Having a family history of anxiety disorders.  Being very shy.  Experiencing very stressful life events, such as the death of a loved one.  Having a very stressful family environment. What are the signs or symptoms? People with GAD often worry excessively about many things in their lives, such as their health and family. Symptoms may also include:  Mental and emotional symptoms: ? Worrying excessively about natural disasters. ? Fear of being late. ? Difficulty concentrating. ? Fears that others are judging your performance.  Physical symptoms: ? Fatigue. ? Headaches, muscle tension, muscle twitches, trembling, or feeling shaky. ? Feeling like your heart is pounding or beating very fast. ? Feeling out of breath or like you cannot take a deep breath. ? Having trouble falling asleep or staying asleep, or experiencing restlessness. ? Sweating. ? Nausea, diarrhea, or irritable bowel syndrome (IBS).  Behavioral symptoms: ? Experiencing erratic moods or irritability. ? Avoidance of new situations. ? Avoidance of  people. ? Extreme difficulty making decisions. How is this diagnosed? This condition is diagnosed based on your symptoms and medical history. You will also have a physical exam. Your health care provider may perform tests to rule out other possible causes of your symptoms. To be diagnosed with GAD, a person must have anxiety that:  Is out of his or her control.  Affects several different aspects of his or her life, such as work and relationships.  Causes distress that makes him or her unable to take part in normal activities.  Includes at least three symptoms of GAD, such as restlessness, fatigue, trouble concentrating, irritability, muscle tension, or sleep problems. Before your health care provider can confirm a diagnosis of GAD, these symptoms must be present more days than they are not, and they must last for 6 months or longer. How is this treated? This condition may be treated with:  Medicine. Antidepressant medicine is usually prescribed for long-term daily control. Anti-anxiety medicines may be added in severe cases, especially when panic attacks occur.  Talk therapy (psychotherapy). Certain types of talk therapy can be helpful in treating GAD by providing support, education, and guidance. Options include: ? Cognitive behavioral therapy (CBT). People learn coping skills and self-calming techniques to ease their physical symptoms. They learn to identify unrealistic thoughts and behaviors and to replace them with more appropriate thoughts and behaviors. ? Acceptance and commitment therapy (ACT). This treatment teaches people how to be mindful as a way to cope with unwanted thoughts and feelings. ? Biofeedback. This process trains you to manage your body's response (physiological response) through breathing techniques and relaxation methods. You will work with a therapist while machines are used to monitor your physical   symptoms.  Stress management techniques. These include yoga,  meditation, and exercise. A mental health specialist can help determine which treatment is best for you. Some people see improvement with one type of therapy. However, other people require a combination of therapies.   Follow these instructions at home: Lifestyle  Maintain a consistent routine and schedule.  Anticipate stressful situations. Create a plan, and allow extra time to work with your plan.  Practice stress management or self-calming techniques that you have learned from your therapist or your health care provider. General instructions  Take over-the-counter and prescription medicines only as told by your health care provider.  Understand that you are likely to have setbacks. Accept this and be kind to yourself as you persist to take better care of yourself.  Recognize and accept your accomplishments, even if you judge them as small.  Keep all follow-up visits as told by your health care provider. This is important. Contact a health care provider if:  Your symptoms do not get better.  Your symptoms get worse.  You have signs of depression, such as: ? A persistently sad or irritable mood. ? Loss of enjoyment in activities that used to bring you joy. ? Change in weight or eating. ? Changes in sleeping habits. ? Avoiding friends or family members. ? Loss of energy for normal tasks. ? Feelings of guilt or worthlessness. Get help right away if:  You have serious thoughts about hurting yourself or others. If you ever feel like you may hurt yourself or others, or have thoughts about taking your own life, get help right away. Go to your nearest emergency department or:  Call your local emergency services (911 in the [place]  Call a suicide crisis helpline, such as the National Suicide Prevention Lifeline at 1-800-273-8255. This is open 24 hours a day in the [place]  Text the Crisis Text Line at 741741 (in the [place] Summary  Generalized anxiety disorder (GAD) is a mental  health condition that involves worry that is not triggered by a specific event.  People with GAD often worry excessively about many things in their lives, such as their health and family.  GAD may cause symptoms such as restlessness, trouble concentrating, sleep problems, frequent sweating, nausea, diarrhea, headaches, and trembling or muscle twitching.  A mental health specialist can help determine which treatment is best for you. Some people see improvement with one type of therapy. However, other people require a combination of therapies. This information is not intended to replace advice given to you by your health care provider. Make sure you discuss any questions you have with your health care provider. Document Revised: 10/23/2018 Document Reviewed: 10/23/2018 Elsevier Patient Education  2021 [employer].  

## 2020-05-31 NOTE — Progress Notes (Signed)
Subjective:    Patient ID: Lindsay Bradford, female    DOB: [birth year], 60 y.o.   MRN: [id number]  Chief Complaint  Patient presents with  . Follow-up    Depression    Pt presents to the office today to recheck GAD and depression. She was seen on 04/27/20 and we increased Effexor to 150 mg from 75 mg and added Buspar 5 BID as needed. I also placed a referral to Psychiatry.  She reports her appointment is scheduled in June to see [facility].  She reports her anxiety and depression has improved, but her car continues to break down. This gives her a lot of anxiety.   We also increased her Topamax to 100 mg BID from daily for headaches. She reports she was getting headaches daily, but since the increase she has not had one.  Anxiety Presents for follow-up visit. Symptoms include depressed mood, excessive worry, irritability, nervous/anxious behavior, palpitations, panic, restlessness and suicidal ideas. Symptoms occur most days. The severity of symptoms is moderate.    Depression        This is a chronic problem.  The current episode started more than 1 year ago.   The onset quality is gradual.   The problem occurs intermittently.  Associated symptoms include fatigue, helplessness, hopelessness, irritable, restlessness, headaches, sad and suicidal ideas.  Past treatments include SNRIs - Serotonin and norepinephrine reuptake inhibitors.  Past medical history includes anxiety.   Headache  This is a chronic problem. The current episode started more than 1 year ago. The problem occurs intermittently. The problem has been waxing and waning. The pain is located in the frontal region. The pain quality is similar to prior headaches. The pain is moderate.  Nicotine Dependence Presents for follow-up visit. Symptoms include fatigue and irritability. Her urge triggers include company of smokers. The symptoms have been stable. She smokes < 1/2 a pack of cigarettes per day.      Review of Systems   Constitutional: Positive for fatigue and irritability.  Cardiovascular: Positive for palpitations.  Neurological: Positive for headaches.  Psychiatric/Behavioral: Positive for depression and suicidal ideas. The patient is nervous/anxious.   All other systems reviewed and are negative.      Objective:   Physical Exam Vitals reviewed.  Constitutional:      General: She is irritable. She is not in acute distress.    Appearance: She is well-developed. She is obese.  HENT:     Head: Normocephalic and atraumatic.     Right Ear: Tympanic membrane normal.     Left Ear: Tympanic membrane normal.  Eyes:     Pupils: Pupils are equal, round, and reactive to light.  Neck:     Thyroid: No thyromegaly.  Cardiovascular:     Rate and Rhythm: Normal rate and regular rhythm.     Heart sounds: Normal heart sounds. No murmur heard.   Pulmonary:     Effort: Pulmonary effort is normal. No respiratory distress.     Breath sounds: Normal breath sounds. No wheezing.  Abdominal:     General: Bowel sounds are normal. There is no distension.     Palpations: Abdomen is soft.     Tenderness: There is no abdominal tenderness.  Musculoskeletal:        General: No tenderness. Normal range of motion.     Cervical back: Normal range of motion and neck supple.  Skin:    General: Skin is warm and dry.  Neurological:     Mental Status:  She is alert and oriented to person, place, and time.     Cranial Nerves: No cranial nerve deficit.     Deep Tendon Reflexes: Reflexes are normal and symmetric.  Psychiatric:        Behavior: Behavior normal.        Thought Content: Thought content normal.        Judgment: Judgment normal.     BP 110/84   Pulse 96   Temp 98 F (36.7 C) (Temporal)   Ht 5\' 8"  (1.727 m)   Wt 215 lb (97.5 kg)   BMI 32.69 kg/m        Assessment & Plan:  Lindsay Bradford comes in today with chief complaint of Follow-up (Depression )   Diagnosis and orders addressed:  1. Major  depressive disorder, recurrent severe without psychotic features (HCC) - busPIRone (BUSPAR) 7.5 MG tablet; Take 1 tablet (7.5 mg total) by mouth 2 (two) times daily.  Dispense: 180 tablet; Refill: 2  2. GAD (generalized anxiety disorder) -Will increase Buspar to 7.5 mg from 5 mg Stress management  - busPIRone (BUSPAR) 7.5 MG tablet; Take 1 tablet (7.5 mg total) by mouth 2 (two) times daily.  Dispense: 180 tablet; Refill: 2  3. Morbid obesity (HCC)  4. Migraine without aura and without status migrainosus, not intractable Improved with the increased dose    Information given on [facility], number given. She will schedule her mammogram.  Health Maintenance reviewed Diet and exercise encouraged  Follow up plan: 3 months    [name], FNP

## 2020-06-02 ENCOUNTER — Encounter: Payer: Self-pay | Admitting: Family Medicine

## 2020-06-07 DIAGNOSIS — J439 Emphysema, unspecified: Secondary | ICD-10-CM | POA: Diagnosis not present

## 2020-06-07 DIAGNOSIS — R062 Wheezing: Secondary | ICD-10-CM | POA: Diagnosis not present

## 2020-06-07 DIAGNOSIS — R0600 Dyspnea, unspecified: Secondary | ICD-10-CM | POA: Diagnosis not present

## 2020-06-17 ENCOUNTER — Other Ambulatory Visit: Payer: Self-pay | Admitting: Family

## 2020-06-17 DIAGNOSIS — Z1231 Encounter for screening mammogram for malignant neoplasm of breast: Secondary | ICD-10-CM

## 2020-06-27 DIAGNOSIS — J441 Chronic obstructive pulmonary disease with (acute) exacerbation: Secondary | ICD-10-CM | POA: Diagnosis not present

## 2020-06-27 DIAGNOSIS — R0600 Dyspnea, unspecified: Secondary | ICD-10-CM | POA: Diagnosis not present

## 2020-06-27 DIAGNOSIS — J439 Emphysema, unspecified: Secondary | ICD-10-CM | POA: Diagnosis not present

## 2020-06-27 DIAGNOSIS — R062 Wheezing: Secondary | ICD-10-CM | POA: Diagnosis not present

## 2020-06-30 DIAGNOSIS — R062 Wheezing: Secondary | ICD-10-CM | POA: Diagnosis not present

## 2020-06-30 DIAGNOSIS — J441 Chronic obstructive pulmonary disease with (acute) exacerbation: Secondary | ICD-10-CM | POA: Diagnosis not present

## 2020-06-30 DIAGNOSIS — R0600 Dyspnea, unspecified: Secondary | ICD-10-CM | POA: Diagnosis not present

## 2020-06-30 DIAGNOSIS — J439 Emphysema, unspecified: Secondary | ICD-10-CM | POA: Diagnosis not present

## 2020-07-08 DIAGNOSIS — R0600 Dyspnea, unspecified: Secondary | ICD-10-CM | POA: Diagnosis not present

## 2020-07-08 DIAGNOSIS — R062 Wheezing: Secondary | ICD-10-CM | POA: Diagnosis not present

## 2020-07-08 DIAGNOSIS — J439 Emphysema, unspecified: Secondary | ICD-10-CM | POA: Diagnosis not present

## 2020-07-11 ENCOUNTER — Other Ambulatory Visit: Payer: Self-pay | Admitting: Family

## 2020-07-13 ENCOUNTER — Encounter (HOSPITAL_COMMUNITY): Payer: Self-pay | Admitting: Psychiatry

## 2020-07-13 ENCOUNTER — Ambulatory Visit (INDEPENDENT_AMBULATORY_CARE_PROVIDER_SITE_OTHER): Payer: Medicare HMO | Admitting: Psychiatry

## 2020-07-13 VITALS — BP 110/85 | HR 120 | Ht 67.0 in | Wt 213.0 lb

## 2020-07-13 DIAGNOSIS — F411 Generalized anxiety disorder: Secondary | ICD-10-CM | POA: Diagnosis not present

## 2020-07-13 DIAGNOSIS — F331 Major depressive disorder, recurrent, moderate: Secondary | ICD-10-CM

## 2020-07-13 DIAGNOSIS — F4310 Post-traumatic stress disorder, unspecified: Secondary | ICD-10-CM
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
## 2020-07-27 DIAGNOSIS — J439 Emphysema, unspecified: Secondary | ICD-10-CM | POA: Diagnosis not present

## 2020-07-27 DIAGNOSIS — R0600 Dyspnea, unspecified: Secondary | ICD-10-CM | POA: Diagnosis not present

## 2020-07-27 DIAGNOSIS — R062 Wheezing: Secondary | ICD-10-CM | POA: Diagnosis not present

## 2020-07-27 DIAGNOSIS — J441 Chronic obstructive pulmonary disease with (acute) exacerbation: Secondary | ICD-10-CM | POA: Diagnosis not present

## 2020-07-30 DIAGNOSIS — J441 Chronic obstructive pulmonary disease with (acute) exacerbation: Secondary | ICD-10-CM | POA: Diagnosis not present

## 2020-07-30 DIAGNOSIS — R062 Wheezing: Secondary | ICD-10-CM | POA: Diagnosis not present

## 2020-07-30 DIAGNOSIS — R0600 Dyspnea, unspecified: Secondary | ICD-10-CM | POA: Diagnosis not present

## 2020-07-30 DIAGNOSIS — J439 Emphysema, unspecified: Secondary | ICD-10-CM | POA: Diagnosis not present

## 2020-08-07 DIAGNOSIS — R0600 Dyspnea, unspecified: Secondary | ICD-10-CM | POA: Diagnosis not present

## 2020-08-07 DIAGNOSIS — J439 Emphysema, unspecified: Secondary | ICD-10-CM | POA: Diagnosis not present

## 2020-08-07 DIAGNOSIS — R062 Wheezing: Secondary | ICD-10-CM | POA: Diagnosis not present

## 2020-08-09 ENCOUNTER — Emergency Department (HOSPITAL_COMMUNITY): Payer: Medicare HMO

## 2020-08-09 ENCOUNTER — Emergency Department (HOSPITAL_COMMUNITY)
Admission: EM | Admit: 2020-08-09 | Discharge: 2020-08-10 | Disposition: A | Discharge status: Home / Self Care | Payer: Medicare HMO | Attending: Emergency Medicine | Admitting: Emergency Medicine

## 2020-08-09 ENCOUNTER — Other Ambulatory Visit: Payer: Self-pay

## 2020-08-09 DIAGNOSIS — I2510 Atherosclerotic heart disease of native coronary artery without angina pectoris: Secondary | ICD-10-CM | POA: Diagnosis not present

## 2020-08-09 DIAGNOSIS — Z7901 Long term (current) use of anticoagulants: Secondary | ICD-10-CM | POA: Diagnosis not present

## 2020-08-09 DIAGNOSIS — M79641 Pain in right hand: Secondary | ICD-10-CM | POA: Diagnosis not present

## 2020-08-09 DIAGNOSIS — M19041 Primary osteoarthritis, right hand: Secondary | ICD-10-CM | POA: Diagnosis not present

## 2020-08-09 DIAGNOSIS — M25541 Pain in joints of right hand: Secondary | ICD-10-CM | POA: Diagnosis not present

## 2020-08-09 DIAGNOSIS — F17210 Nicotine dependence, cigarettes, uncomplicated: Secondary | ICD-10-CM | POA: Insufficient documentation

## 2020-08-09 DIAGNOSIS — Z79899 Other long term (current) drug therapy: Secondary | ICD-10-CM | POA: Diagnosis not present

## 2020-08-09 DIAGNOSIS — M7989 Other specified soft tissue disorders: Secondary | ICD-10-CM | POA: Diagnosis not present

## 2020-08-09 DIAGNOSIS — J449 Chronic obstructive pulmonary disease, unspecified: Secondary | ICD-10-CM | POA: Diagnosis not present

## 2020-08-09 DIAGNOSIS — Z7982 Long term (current) use of aspirin: Secondary | ICD-10-CM | POA: Diagnosis not present

## 2020-08-09 DIAGNOSIS — I10 Essential (primary) hypertension: Secondary | ICD-10-CM | POA: Insufficient documentation

## 2020-08-09 DIAGNOSIS — J45909 Unspecified asthma, uncomplicated: Secondary | ICD-10-CM | POA: Insufficient documentation

## 2020-08-09 NOTE — ED Provider Notes (Signed)
[facility] EMERGENCY DEPARTMENT Provider Note   CSN: [id number] Arrival date & time: 08/09/20  2146     History Chief Complaint  Patient presents with   Hand Pain    Lindsay Bradford is a 60 y.o. female.  The history is provided by the patient.  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  Pain is worse if anything touches it or if she bends her finger.  At rest, there is no pain.  She denies any trauma.  She is concerned that there might be a cyst or a blood clot there.   Past Medical History:  Diagnosis Date   Arthritis    Asthma    Bursitis of left hip    COPD (chronic obstructive pulmonary disease) (HCC)    Coronary artery disease    Depression    DVT (deep venous thrombosis) (HCC)    left   Factor V Leiden (HCC)    Fibromyalgia    GERD (gastroesophageal reflux disease)    Glaucoma    Headache    sesaonal   Herpes simplex type 2 infection    History of blood transfusion    Hyperlipidemia    Hypotension    90's, Normally; in pain normal; BP   Ischemic cardiomyopathy    MI (myocardial infarction) (HCC)    x 2 2000    Pneumonia    Stroke (HCC) 02/27/2018   no residual    Patient Active Problem List   Diagnosis Date Noted   Migraine without aura and without status migrainosus, not intractable 05/31/2020   GAD (generalized anxiety disorder) 04/27/2020   COPD with acute exacerbation (HCC) 03/25/2020   Acute respiratory failure with hypoxia (HCC) 03/25/2020   Insomnia 02/17/2019   Opiate abuse, episodic (HCC) 12/17/2018   Major depressive disorder, recurrent severe without psychotic features (HCC) 12/12/2018   GERD (gastroesophageal reflux disease) 12/05/2018   Closed trimalleolar fracture of ankle, right, initial encounter 10/18/2018    Closed displaced trimalleolar fracture of right lower leg 10/15/2018   Coronary artery disease involving native coronary artery of native heart 08/26/2018   Morbid obesity (HCC) 08/26/2018   NSVT (nonsustained ventricular tachycardia) (HCC) 06/18/2018   Ischemic cardiomyopathy 03/13/2018   S/P coronary artery stent placement 03/13/2018   Coronary artery disease with history of myocardial infarction without history of CABG 03/13/2018   DVT (deep venous thrombosis) (HCC) 08/14/2017   Dyslipidemia 08/02/2017   Peripheral arterial disease (HCC) 06/06/2017   Mallet toe, acquired, left 02/08/2017   Abdominal wall hernia 08/31/2016   CAD S/P percutaneous coronary angioplasty 08/25/2016   Allergy to NSAIDs 03/09/2014   Closed fracture of distal end of right ulna 02/12/2014   Spondylolisthesis of lumbosacral region 01/14/2014   Arthritis 04/05/2012   History of DVT (deep vein thrombosis) 02/02/2012   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     Family History  Problem Relation Age of Onset   Arthritis Mother    Cancer Mother    Colon cancer Mother    Breast cancer Mother    COPD Father    COPD Brother    Heart disease Sister    COPD Brother    Stroke Brother    Heart disease Brother    Clotting disorder Brother    Colon cancer Maternal Grandmother    Cancer Maternal Grandmother     Breast cancer Maternal Grandmother    Cancer Paternal Grandmother    Colon cancer Paternal Grandmother    Cancer Nephew    Colon cancer Nephew     Social History   Tobacco Use   Smoking status: Heavy Smoker    Packs/day: 0.50    Years: 35.00    Pack years: 17.50    Types: Cigarettes   Smokeless tobacco: Never  Vaping Use   Vaping Use: Some days   Substances: Flavoring  Substance Use Topics   Alcohol use: Not Currently   Drug use: Not Currently    Home Medications Prior to Admission medications   Medication Sig Start Date End Date Taking? Authorizing Provider  traZODone (DESYREL) 150 MG tablet USE FROM 1/3 TO 1 TABLET NIGHTLY AS NEEDED FOR SLEEP 06/01/20   Hawks, Neysa Bonito A, FNP  albuterol (VENTOLIN HFA) 108 (90 Base) MCG/ACT inhaler Inhale 1-2 puffs into the lungs every 6 (six) hours as needed for wheezing or shortness of breath. 04/26/20   Jannifer Rodney A, FNP  aspirin EC 81 MG EC tablet Take 1 tablet (81 mg total) by mouth daily. 12/18/18   Clapacs, Jackquline Denmark, MD  busPIRone (BUSPAR) 7.5 MG tablet Take 1 tablet (7.5 mg total) by mouth 2 (two) times daily. Patient not taking: Reported on 07/13/2020 05/31/20   [name], FNP  Calcium Carbonate-Vitamin D 600-400 MG-UNIT tablet Take by mouth.    [provider]  nitroGLYCERIN (NITROSTAT) 0.4 MG SL tablet Place 1 tablet (0.4 mg total) under the tongue every 5 (five) minutes as needed for chest pain. 12/17/18   Clapacs, Jackquline Denmark, MD  omega-3 acid ethyl esters (LOVAZA) 1 g capsule Take 1 capsule (1 g total) by mouth 2 (two) times daily. 12/17/18   Clapacs, Jackquline Denmark, MD  pantoprazole (PROTONIX) 40 MG tablet Take 1 tablet (40 mg total) by mouth daily. (Needs to be seen before next refill) 04/26/20   [name], FNP  ranolazine (RANEXA) 500 MG 12 hr tablet Take 500 mg by mouth 2 (two) times daily.    [provider]  rivaroxaban (XARELTO) 20 MG TABS tablet Take 1 tablet (20 mg total) by mouth daily. 12/18/18   Clapacs, Jackquline Denmark, MD  rosuvastatin (CRESTOR) 40 MG tablet Take 1 tablet (40 mg total) by mouth daily. 05/10/20   [name], FNP  topiramate (TOPAMAX) 100 MG tablet TAKE 1 TABLET EVERY DAY 05/10/20   Jannifer Rodney A, FNP  valACYclovir (VALTREX) 500 MG tablet TAKE 1 TABLET BY MOUTH EVERY DAY 07/12/20   Jannifer Rodney A, FNP  venlafaxine XR (EFFEXOR XR) 150 MG 24 hr capsule Take 1 capsule (150 mg total) by mouth daily with breakfast. Patient not taking: Reported on 07/13/2020 05/10/20  Jannifer Rodney A, FNP    Allergies    Cephalexin, Clarithromycin, Ibuprofen, Other, Oxymorphone, Penicillins, Potassium chloride, Sulfa antibiotics, Clindamycin, Fentanyl, Naproxen, Iodinated diagnostic agents, Lyrica [pregabalin], and Tetracycline  Review of Systems   Review of Systems  All other systems reviewed and are negative.  Physical Exam Updated Vital Signs BP 123/88   Pulse (!) 107   Temp 98.2 F (36.8 C)   Resp 17   Ht 5\' 7"  (1.702 m)   Wt 104.3 kg   SpO2 95%   BMI 36.02 kg/m   Physical Exam Vitals and nursing note reviewed.  60 year old female, resting comfortably and in no acute distress. Vital signs are significant for slightly elevated heart rate. Oxygen saturation is 95%, which is normal. Head is normocephalic and atraumatic. PERRLA, EOMI. Oropharynx is clear. Neck is nontender and supple without adenopathy or JVD. Back is nontender and there is no CVA tenderness. Lungs are clear without rales, wheezes, or rhonchi. Chest is nontender. Heart has regular rate and rhythm without murmur. Abdomen is soft, flat, nontender without masses or hepatosplenomegaly and peristalsis is normoactive. Extremities: There is mild swelling around the right second MCP joint with faint discoloration of ecchymosis.  This is markedly tender to palpation.  Pain is also worse with passive flexion of the MCP joint. Skin is warm and dry without rash. Neurologic: Mental status is normal, cranial nerves are intact, there  are no motor or sensory deficits.  ED Results / Procedures / Treatments    Radiology DG Hand Complete Right  Result Date: 08/10/2020 CLINICAL DATA:  Pain, swelling of the second MCP now radiating to the forearm EXAM: RIGHT HAND - COMPLETE 3+ VIEW COMPARISON:  None. FINDINGS: Mild focal soft tissue swelling is noted at the level of the second MCP albeit without associated erosion or destructive changes or other specific features of an underlying arthropathy. No acute bony abnormality. Specifically, no fracture, subluxation, or dislocation. Background of mild arthrosis. IMPRESSION: Focal swelling of the second metacarpophalangeal joint without acute osseous abnormality or evidence of an underlying inflammatory or erosive arthropathy. Background of mild degenerative changes in the hand. Electronically Signed   By: 08/12/2020 M.D.   On: 08/10/2020 01:05    Procedures Procedures   Medications Ordered in ED Medications - No data to display  ED Course  I have reviewed the triage vital signs and the nursing notes.  Pertinent imaging results that were available during my care of the patient were reviewed by me and considered in my medical decision making (see chart for details).   MDM Rules/Calculators/A&P                         Pain and swelling of the right second MCP joint.  This appears to be a spontaneous hemarthrosis secondary to have an anticoagulated state.  Will send for x-rays.  Old records are reviewed, and she has no relevant past visits.  X-rays show soft tissue swelling but no acute process.  Patient is advised to apply ice, take acetaminophen as needed for pain.  She is referred to hand surgery for follow-up.  Final Clinical Impression(s) / ED Diagnoses Final diagnoses:  Metacarpophalangeal joint pain of right hand  Chronic anticoagulation    Rx / DC Orders ED Discharge Orders     None        08/12/2020, MD 08/10/20 [phone]

## 2020-08-09 NOTE — ED Triage Notes (Signed)
Right hand pain for a few months. Now the pain is going up the back of her arm. Doesn't recall any injuries.   Says she has not been able to get it checked out

## 2020-08-10 DIAGNOSIS — M19041 Primary osteoarthritis, right hand: Secondary | ICD-10-CM | POA: Diagnosis not present

## 2020-08-10 DIAGNOSIS — M7989 Other specified soft tissue disorders: Secondary | ICD-10-CM | POA: Diagnosis not present

## 2020-08-10 NOTE — Discharge Instructions (Addendum)
I suspect the pain and swelling are from some bleeding into the joint.  Please apply ice for 30 minutes at a time, 4 times a day.  You may take acetaminophen as needed for pain.  Follow-up with the hand specialist.

## 2020-08-23 DIAGNOSIS — M79644 Pain in right finger(s): Secondary | ICD-10-CM | POA: Diagnosis not present

## 2020-08-27 DIAGNOSIS — J439 Emphysema, unspecified: Secondary | ICD-10-CM | POA: Diagnosis not present

## 2020-08-27 DIAGNOSIS — J441 Chronic obstructive pulmonary disease with (acute) exacerbation: Secondary | ICD-10-CM | POA: Diagnosis not present

## 2020-08-27 DIAGNOSIS — R062 Wheezing: Secondary | ICD-10-CM | POA: Diagnosis not present

## 2020-08-27 DIAGNOSIS — R0600 Dyspnea, unspecified: Secondary | ICD-10-CM | POA: Diagnosis not present

## 2020-08-30 ENCOUNTER — Ambulatory Visit (HOSPITAL_COMMUNITY): Payer: Medicare HMO | Admitting: Licensed Clinical Social Worker

## 2020-08-30 DIAGNOSIS — R0600 Dyspnea, unspecified: Secondary | ICD-10-CM | POA: Diagnosis not present

## 2020-08-30 DIAGNOSIS — R062 Wheezing: Secondary | ICD-10-CM | POA: Diagnosis not present

## 2020-08-30 DIAGNOSIS — J441 Chronic obstructive pulmonary disease with (acute) exacerbation: Secondary | ICD-10-CM | POA: Diagnosis not present

## 2020-08-30 DIAGNOSIS — J439 Emphysema, unspecified: Secondary | ICD-10-CM | POA: Diagnosis not present

## 2020-08-30 NOTE — Progress Notes (Signed)
Patient contacted patient by text for an appointment and she did not respond. Session is a no show.

## 2020-08-31 ENCOUNTER — Other Ambulatory Visit: Payer: Self-pay

## 2020-08-31 ENCOUNTER — Ambulatory Visit (INDEPENDENT_AMBULATORY_CARE_PROVIDER_SITE_OTHER): Payer: Medicare HMO | Admitting: Family

## 2020-08-31 ENCOUNTER — Encounter: Payer: Self-pay | Admitting: Family

## 2020-08-31 ENCOUNTER — Ambulatory Visit (HOSPITAL_COMMUNITY): Payer: Medicare HMO | Admitting: Psychiatry

## 2020-08-31 VITALS — BP 122/77 | HR 90 | Temp 97.1°F | Ht 67.0 in | Wt 207.6 lb

## 2020-08-31 DIAGNOSIS — E669 Obesity, unspecified: Secondary | ICD-10-CM

## 2020-08-31 DIAGNOSIS — E785 Hyperlipidemia, unspecified: Secondary | ICD-10-CM | POA: Diagnosis not present

## 2020-08-31 DIAGNOSIS — K219 Gastro-esophageal reflux disease without esophagitis: Secondary | ICD-10-CM

## 2020-08-31 DIAGNOSIS — F332 Major depressive disorder, recurrent severe without psychotic features: Secondary | ICD-10-CM

## 2020-08-31 DIAGNOSIS — F411 Generalized anxiety disorder: Secondary | ICD-10-CM | POA: Diagnosis not present

## 2020-08-31 DIAGNOSIS — I2510 Atherosclerotic heart disease of native coronary artery without angina pectoris: Secondary | ICD-10-CM

## 2020-08-31 DIAGNOSIS — M1990 Unspecified osteoarthritis, unspecified site: Secondary | ICD-10-CM

## 2020-08-31 DIAGNOSIS — G4700 Insomnia, unspecified: Secondary | ICD-10-CM | POA: Diagnosis not present

## 2020-08-31 DIAGNOSIS — I739 Peripheral vascular disease, unspecified: Secondary | ICD-10-CM

## 2020-08-31 DIAGNOSIS — M25541 Pain in joints of right hand: Secondary | ICD-10-CM

## 2020-08-31 MED ORDER — TRAMADOL HCL 50 MG PO TABS: 50.0000 mg | ORAL_TABLET | Freq: Three times a day (TID) | ORAL | 0 refills | Status: AC | PRN

## 2020-08-31 NOTE — Patient Instructions (Signed)
Health Maintenance, Female Adopting a healthy lifestyle and getting preventive care are important in promoting health and wellness. Ask your health care provider about: The right schedule for you to have regular tests and exams. Things you can do on your own to prevent diseases and keep yourself healthy. What should I know about diet, weight, and exercise? Eat a healthy diet  Eat a diet that includes plenty of vegetables, fruits, low-fat dairy products, and lean protein. Do not eat a lot of foods that are high in solid fats, added sugars, or sodium.  Maintain a healthy weight Body mass index (BMI) is used to identify weight problems. It estimates body fat based on height and weight. Your health care provider can help determineyour BMI and help you achieve or maintain a healthy weight. Get regular exercise Get regular exercise. This is one of the most important things you can do for your health. Most adults should: Exercise for at least 150 minutes each week. The exercise should increase your heart rate and make you sweat (moderate-intensity exercise). Do strengthening exercises at least twice a week. This is in addition to the moderate-intensity exercise. Spend less time sitting. Even light physical activity can be beneficial. Watch cholesterol and blood lipids Have your blood tested for lipids and cholesterol at 58 years of age, then havethis test every 5 years. Have your cholesterol levels checked more often if: Your lipid or cholesterol levels are high. You are older than 58 years of age. You are at high risk for heart disease. What should I know about cancer screening? Depending on your health history and family history, you may need to have cancer screening at various ages. This may include screening for: Breast cancer. Cervical cancer. Colorectal cancer. Skin cancer. Lung cancer. What should I know about heart disease, diabetes, and high blood pressure? Blood pressure and heart  disease High blood pressure causes heart disease and increases the risk of stroke. This is more likely to develop in people who have high blood pressure readings, are of African descent, or are overweight. Have your blood pressure checked: Every 3-5 years if you are 18-39 years of age. Every year if you are 40 years old or older. Diabetes Have regular diabetes screenings. This checks your fasting blood sugar level. Have the screening done: Once every three years after age 40 if you are at a normal weight and have a low risk for diabetes. More often and at a younger age if you are overweight or have a high risk for diabetes. What should I know about preventing infection? Hepatitis B If you have a higher risk for hepatitis B, you should be screened for this virus. Talk with your health care provider to find out if you are at risk forhepatitis B infection. Hepatitis C Testing is recommended for: Everyone born from 1945 through 1965. Anyone with known risk factors for hepatitis C. Sexually transmitted infections (STIs) Get screened for STIs, including gonorrhea and chlamydia, if: You are sexually active and are younger than 58 years of age. You are older than 58 years of age and your health care provider tells you that you are at risk for this type of infection. Your sexual activity has changed since you were last screened, and you are at increased risk for chlamydia or gonorrhea. Ask your health care provider if you are at risk. Ask your health care provider about whether you are at high risk for HIV. Your health care provider may recommend a prescription medicine to help   prevent HIV infection. If you choose to take medicine to prevent HIV, you should first get tested for HIV. You should then be tested every 3 months for as long as you are taking the medicine. Pregnancy If you are about to stop having your period (premenopausal) and you may become pregnant, seek counseling before you get  pregnant. Take 400 to 800 micrograms (mcg) of folic acid every day if you become pregnant. Ask for birth control (contraception) if you want to prevent pregnancy. Osteoporosis and menopause Osteoporosis is a disease in which the bones lose minerals and strength with aging. This can result in bone fractures. If you are 65 years old or older, or if you are at risk for osteoporosis and fractures, ask your health care provider if you should: Be screened for bone loss. Take a calcium or vitamin D supplement to lower your risk of fractures. Be given hormone replacement therapy (HRT) to treat symptoms of menopause. Follow these instructions at home: Lifestyle Do not use any products that contain nicotine or tobacco, such as cigarettes, e-cigarettes, and chewing tobacco. If you need help quitting, ask your health care provider. Do not use street drugs. Do not share needles. Ask your health care provider for help if you need support or information about quitting drugs. Alcohol use Do not drink alcohol if: Your health care provider tells you not to drink. You are pregnant, may be pregnant, or are planning to become pregnant. If you drink alcohol: Limit how much you use to 0-1 drink a day. Limit intake if you are breastfeeding. Be aware of how much alcohol is in your drink. In the [place] one drink equals one 12 oz bottle of beer (355 mL), one 5 oz glass of wine (148 mL), or one 1 oz glass of hard liquor (44 mL). General instructions Schedule regular health, dental, and eye exams. Stay current with your vaccines. Tell your health care provider if: You often feel depressed. You have ever been abused or do not feel safe at home. Summary Adopting a healthy lifestyle and getting preventive care are important in promoting health and wellness. Follow your health care provider's instructions about healthy diet, exercising, and getting tested or screened for diseases. Follow your health care provider's  instructions on monitoring your cholesterol and blood pressure. This information is not intended to replace advice given to you by your health care provider. Make sure you discuss any questions you have with your healthcare provider. Document Revised: 12/26/2017 Document Reviewed: 12/26/2017 Elsevier Patient Education  2022 Elsevier Inc.  

## 2020-08-31 NOTE — Progress Notes (Signed)
Subjective:    Patient ID: Lindsay Bradford, female    DOB: 1960/02/24, 60 y.o.   MRN: [id number]  Chief Complaint  Patient presents with   Medical Management of Chronic Issues    Patient has double vision when she it out side hand pain surgeon did not give her any meds. Tried to drain stuff from    PT presents to the office today for chronic follow up. She is followed by hand specialists as needed for right knuckle swelling and pain. She is followed by [facility] for GAD, depression, and PTSD. She states she is not going to back them though. She has an appointment with counselor today at 4:45 pm.  She is followed by Cardiologists annually for PAD and CAD.  Anxiety Presents for follow-up visit. Symptoms include depressed mood, excessive worry, irritability, nervous/anxious behavior and restlessness. Patient reports no suicidal ideas. Symptoms occur most days.    Depression        This is a chronic problem.  The current episode started more than 1 year ago.   The onset quality is gradual.   The problem occurs intermittently.  Associated symptoms include helplessness, hopelessness, irritable, restlessness and sad.  Associated symptoms include no suicidal ideas.  Past medical history includes anxiety.   Gastroesophageal Reflux She complains of belching and heartburn. This is a chronic problem. The current episode started more than 1 year ago. The problem occurs frequently. Risk factors include obesity. She has tried a PPI for the symptoms. The treatment provided moderate relief.  Hyperlipidemia This is a chronic problem. The current episode started more than 1 year ago. The problem is controlled. Exacerbating diseases include obesity. Current antihyperlipidemic treatment includes statins. The current treatment provides moderate improvement of lipids. Risk factors for coronary artery disease include dyslipidemia, hypertension and a sedentary lifestyle.  Arthritis Presents for follow-up visit.  She complains of pain. The symptoms have been stable. Affected locations include the left knee, right knee, left shoulder, right shoulder, left MCP and right MCP. Her pain is at a severity of 8/10.  COPD States her breathing is worse when she goes outside. She quit smoking 4 months ago. Using the Spiriva daily and albuterol as needed.    Review of Systems  Constitutional:  Positive for irritability.  Gastrointestinal:  Positive for heartburn.  Musculoskeletal:  Positive for arthritis.  Psychiatric/Behavioral:  Positive for depression. Negative for suicidal ideas. The patient is nervous/anxious.   All other systems reviewed and are negative.     Objective:   Physical Exam Vitals reviewed.  Constitutional:      General: She is irritable. She is not in acute distress.    Appearance: She is well-developed. She is obese.  HENT:     Head: Normocephalic and atraumatic.     Right Ear: Tympanic membrane normal.     Left Ear: Tympanic membrane normal.  Eyes:     Pupils: Pupils are equal, round, and reactive to light.  Neck:     Thyroid: No thyromegaly.  Cardiovascular:     Rate and Rhythm: Normal rate and regular rhythm.     Heart sounds: Normal heart sounds. No murmur heard. Pulmonary:     Effort: Pulmonary effort is normal. No respiratory distress.     Breath sounds: Normal breath sounds. No wheezing.  Abdominal:     General: Bowel sounds are normal. There is no distension.     Palpations: Abdomen is soft.     Tenderness: There is no abdominal tenderness.  Musculoskeletal:        General: Tenderness (right first) present. Normal range of motion.       Arms:     Cervical back: Normal range of motion and neck supple.     Comments: Right knuckle erythemas, swollen, and tender  Skin:    General: Skin is warm and dry.  Neurological:     Mental Status: She is alert and oriented to person, place, and time.     Cranial Nerves: No cranial nerve deficit.     Deep Tendon Reflexes:  Reflexes are normal and symmetric.  Psychiatric:        Behavior: Behavior normal.        Thought Content: Thought content normal.        Judgment: Judgment normal.      BP 122/77   Pulse 90   Temp (!) 97.1 F (36.2 C) (Temporal)   Ht 5' 7"  (1.702 m)   Wt 207 lb 9.6 oz (94.2 kg)   SpO2 92%   BMI 32.51 kg/m      Assessment & Plan:  Lindsay Bradford comes in today with chief complaint of Medical Management of Chronic Issues (Patient has double vision when she it out side hand pain surgeon did not give her any meds. Tried to drain stuff from )   Diagnosis and orders addressed:  1. Coronary artery disease involving native coronary artery of native heart without angina pectoris - CMP14+EGFR - CBC with Differential/Platelet  2. Peripheral arterial disease (HCC) - CMP14+EGFR - CBC with Differential/Platelet  3. Gastroesophageal reflux disease, unspecified whether esophagitis present - CMP14+EGFR - CBC with Differential/Platelet  4. Arthritis - CMP14+EGFR - CBC with Differential/Platelet  5. Dyslipidemia - CMP14+EGFR - CBC with Differential/Platelet  6. Obesity (BMI 30-39.9) - CMP14+EGFR - CBC with Differential/Platelet  7. Major depressive disorder, recurrent severe without psychotic features ([place]) - CMP14+EGFR - CBC with Differential/Platelet  8. Insomnia, unspecified type - CMP14+EGFR - CBC with Differential/Platelet  9. GAD (generalized anxiety disorder) - CMP14+EGFR - CBC with Differential/Platelet  10. Metacarpophalangeal joint pain of right hand Will given a few days of Ultram as needed - traMADol (ULTRAM) 50 MG tablet; Take 1 tablet (50 mg total) by mouth every 8 (eight) hours as needed for up to 5 days.  Dispense: 15 tablet; Refill: 0 - CMP14+EGFR - CBC with Differential/Platelet   Labs pending Health Maintenance reviewed Diet and exercise encouraged  Follow up plan: 3 months    Evelina Dun, FNP

## 2020-09-01 ENCOUNTER — Other Ambulatory Visit: Payer: Self-pay | Admitting: Family

## 2020-09-01 LAB — CBC WITH DIFFERENTIAL/PLATELET
Basophils Absolute: 0.1 10*3/uL (ref 0.0–0.2)
Basos: 2 %
EOS (ABSOLUTE): 0.8 10*3/uL — ABNORMAL HIGH (ref 0.0–0.4)
Eos: 12 %
Hematocrit: 45 % (ref 34.0–46.6)
Hemoglobin: 14.7 g/dL (ref 11.1–15.9)
Immature Grans (Abs): 0 10*3/uL (ref 0.0–0.1)
Immature Granulocytes: 0 %
Lymphocytes Absolute: 2.3 10*3/uL (ref 0.7–3.1)
Lymphs: 40 %
MCH: 31.2 pg (ref 26.6–33.0)
MCHC: 32.7 g/dL (ref 31.5–35.7)
MCV: 96 fL (ref 79–97)
Monocytes Absolute: 0.7 10*3/uL (ref 0.1–0.9)
Monocytes: 11 %
Neutrophils Absolute: 2.1 10*3/uL (ref 1.4–7.0)
Neutrophils: 35 %
Platelets: 268 10*3/uL (ref 150–450)
RBC: 4.71 x10E6/uL (ref 3.77–5.28)
RDW: 13.3 % (ref 11.7–15.4)
WBC: 6.1 10*3/uL (ref 3.4–10.8)

## 2020-09-01 LAB — CMP14+EGFR
ALT: 13 IU/L (ref 0–32)
AST: 20 IU/L (ref 0–40)
Albumin/Globulin Ratio: 1.2 (ref 1.2–2.2)
Albumin: 3.8 g/dL (ref 3.8–4.9)
Alkaline Phosphatase: 80 IU/L (ref 44–121)
BUN/Creatinine Ratio: 14 (ref 9–23)
BUN: 10 mg/dL (ref 6–24)
Bilirubin Total: 0.4 mg/dL (ref 0.0–1.2)
CO2: 21 mmol/L (ref 20–29)
Calcium: 9.3 mg/dL (ref 8.7–10.2)
Chloride: 108 mmol/L — ABNORMAL HIGH (ref 96–106)
Creatinine, Ser: 0.73 mg/dL (ref 0.57–1.00)
Globulin, Total: 3.2 g/dL (ref 1.5–4.5)
Glucose: 106 mg/dL — ABNORMAL HIGH (ref 65–99)
Potassium: 3.6 mmol/L (ref 3.5–5.2)
Sodium: 144 mmol/L (ref 134–144)
Total Protein: 7 g/dL (ref 6.0–8.5)
eGFR: 95 mL/min/{1.73_m2} (ref >59)

## 2020-09-06 DIAGNOSIS — H40013 Open angle with borderline findings, low risk, bilateral: Secondary | ICD-10-CM | POA: Diagnosis not present

## 2020-09-06 DIAGNOSIS — H532 Diplopia: Secondary | ICD-10-CM | POA: Diagnosis not present

## 2020-09-06 DIAGNOSIS — H2513 Age-related nuclear cataract, bilateral: Secondary | ICD-10-CM | POA: Diagnosis not present

## 2020-09-08 ENCOUNTER — Telehealth: Payer: Self-pay | Admitting: Family

## 2020-09-08 DIAGNOSIS — R197 Diarrhea, unspecified: Secondary | ICD-10-CM

## 2020-09-08 NOTE — Telephone Encounter (Signed)
Imodium as needed. Force fluids. BRAT diet.

## 2020-09-08 NOTE — Telephone Encounter (Signed)
Pt has had diarrhea for 3 to 4 months. Stated that she has spoke to [facility] about this and nothing has been called in for this issue. She said that labs have been done to check her levels but nothing further.

## 2020-09-08 NOTE — Telephone Encounter (Signed)
Patient aware and verbalized understanding. Can patient do stool cultures. Patient states this has just been going on way too long. Please advise. Can any of her meds cause this?

## 2020-09-09 NOTE — Telephone Encounter (Signed)
Patient aware and verbalized understanding. °

## 2020-09-09 NOTE — Telephone Encounter (Signed)
Stool culture ordered. I don't see any of her medications that cause diarrhea.

## 2020-09-09 NOTE — Addendum Note (Signed)
Addended by: Jannifer Rodney A on: 09/09/2020 01:52 PM   Modules accepted: Orders

## 2020-09-15 ENCOUNTER — Other Ambulatory Visit: Payer: Self-pay | Admitting: Family

## 2020-09-15 DIAGNOSIS — K219 Gastro-esophageal reflux disease without esophagitis: Secondary | ICD-10-CM

## 2020-09-27 DIAGNOSIS — J441 Chronic obstructive pulmonary disease with (acute) exacerbation: Secondary | ICD-10-CM | POA: Diagnosis not present

## 2020-09-27 DIAGNOSIS — R0600 Dyspnea, unspecified: Secondary | ICD-10-CM | POA: Diagnosis not present

## 2020-09-27 DIAGNOSIS — J439 Emphysema, unspecified: Secondary | ICD-10-CM | POA: Diagnosis not present

## 2020-09-27 DIAGNOSIS — M79644 Pain in right finger(s): Secondary | ICD-10-CM | POA: Diagnosis not present

## 2020-09-27 DIAGNOSIS — R062 Wheezing: Secondary | ICD-10-CM | POA: Diagnosis not present

## 2020-09-30 DIAGNOSIS — J441 Chronic obstructive pulmonary disease with (acute) exacerbation: Secondary | ICD-10-CM | POA: Diagnosis not present

## 2020-09-30 DIAGNOSIS — R0600 Dyspnea, unspecified: Secondary | ICD-10-CM | POA: Diagnosis not present

## 2020-09-30 DIAGNOSIS — J439 Emphysema, unspecified: Secondary | ICD-10-CM | POA: Diagnosis not present

## 2020-09-30 DIAGNOSIS — R062 Wheezing: Secondary | ICD-10-CM | POA: Diagnosis not present

## 2020-10-01 DIAGNOSIS — R55 Syncope and collapse: Secondary | ICD-10-CM | POA: Diagnosis not present

## 2020-10-27 DIAGNOSIS — J441 Chronic obstructive pulmonary disease with (acute) exacerbation: Secondary | ICD-10-CM | POA: Diagnosis not present

## 2020-10-27 DIAGNOSIS — J439 Emphysema, unspecified: Secondary | ICD-10-CM | POA: Diagnosis not present

## 2020-10-27 DIAGNOSIS — R0600 Dyspnea, unspecified: Secondary | ICD-10-CM | POA: Diagnosis not present

## 2020-10-27 DIAGNOSIS — R062 Wheezing: Secondary | ICD-10-CM | POA: Diagnosis not present

## 2020-10-28 ENCOUNTER — Other Ambulatory Visit: Payer: Self-pay | Admitting: *Deleted

## 2020-10-28 MED ORDER — TRAZODONE HCL 150 MG PO TABS: ORAL_TABLET | ORAL | 0 refills | Status: AC

## 2020-10-30 DIAGNOSIS — J441 Chronic obstructive pulmonary disease with (acute) exacerbation: Secondary | ICD-10-CM | POA: Diagnosis not present

## 2020-10-30 DIAGNOSIS — J439 Emphysema, unspecified: Secondary | ICD-10-CM | POA: Diagnosis not present

## 2020-10-30 DIAGNOSIS — R0600 Dyspnea, unspecified: Secondary | ICD-10-CM | POA: Diagnosis not present

## 2020-10-30 DIAGNOSIS — R062 Wheezing: Secondary | ICD-10-CM | POA: Diagnosis not present

## 2020-11-08 DIAGNOSIS — M79641 Pain in right hand: Secondary | ICD-10-CM | POA: Diagnosis not present

## 2020-11-08 DIAGNOSIS — K219 Gastro-esophageal reflux disease without esophagitis: Secondary | ICD-10-CM | POA: Diagnosis not present

## 2020-11-08 DIAGNOSIS — Z23 Encounter for immunization: Secondary | ICD-10-CM | POA: Diagnosis not present

## 2020-11-08 DIAGNOSIS — F4310 Post-traumatic stress disorder, unspecified: Secondary | ICD-10-CM | POA: Diagnosis not present

## 2020-11-08 DIAGNOSIS — D6851 Activated protein C resistance: Secondary | ICD-10-CM | POA: Diagnosis not present

## 2020-11-08 DIAGNOSIS — E785 Hyperlipidemia, unspecified: Secondary | ICD-10-CM | POA: Diagnosis not present

## 2020-11-08 DIAGNOSIS — J449 Chronic obstructive pulmonary disease, unspecified: Secondary | ICD-10-CM | POA: Diagnosis not present

## 2020-11-09 DIAGNOSIS — Z20822 Contact with and (suspected) exposure to covid-19: Secondary | ICD-10-CM | POA: Diagnosis not present

## 2020-11-10 DIAGNOSIS — Z20822 Contact with and (suspected) exposure to covid-19: Secondary | ICD-10-CM | POA: Diagnosis not present

## 2020-11-11 DIAGNOSIS — Z20822 Contact with and (suspected) exposure to covid-19: Secondary | ICD-10-CM | POA: Diagnosis not present

## 2020-11-14 ENCOUNTER — Other Ambulatory Visit: Payer: Self-pay | Admitting: Family

## 2020-11-27 ENCOUNTER — Other Ambulatory Visit: Payer: Self-pay | Admitting: Family

## 2020-11-27 DIAGNOSIS — E785 Hyperlipidemia, unspecified: Secondary | ICD-10-CM

## 2020-11-27 DIAGNOSIS — I739 Peripheral vascular disease, unspecified: Secondary | ICD-10-CM

## 2020-11-27 DIAGNOSIS — F321 Major depressive disorder, single episode, moderate: Secondary | ICD-10-CM

## 2020-11-27 DIAGNOSIS — R519 Headache, unspecified: Secondary | ICD-10-CM

## 2020-11-27 DIAGNOSIS — I2510 Atherosclerotic heart disease of native coronary artery without angina pectoris: Secondary | ICD-10-CM

## 2020-11-27 DIAGNOSIS — F411 Generalized anxiety disorder: Secondary | ICD-10-CM

## 2020-11-27 DIAGNOSIS — Z8673 Personal history of transient ischemic attack (TIA), and cerebral infarction without residual deficits: Secondary | ICD-10-CM

## 2020-11-29 ENCOUNTER — Encounter: Payer: Self-pay | Admitting: Family

## 2020-11-29 NOTE — Telephone Encounter (Signed)
Letter mailed

## 2020-11-29 NOTE — Telephone Encounter (Signed)
[facility]. Please make 3 mos ckup (was seen in Aug). Mail order was sent

## 2020-11-29 NOTE — Telephone Encounter (Signed)
Lmovm to make appt

## 2021-01-06 ENCOUNTER — Other Ambulatory Visit: Payer: Self-pay | Admitting: Family

## 2021-01-06 DIAGNOSIS — F332 Major depressive disorder, recurrent severe without psychotic features: Secondary | ICD-10-CM

## 2021-01-06 DIAGNOSIS — F411 Generalized anxiety disorder: Secondary | ICD-10-CM

## 2021-01-29 ENCOUNTER — Emergency Department (HOSPITAL_COMMUNITY): Payer: Medicare HMO

## 2021-01-29 ENCOUNTER — Emergency Department (HOSPITAL_COMMUNITY)
Admission: EM | Admit: 2021-01-29 | Discharge: 2021-01-29 | Disposition: A | Discharge status: Home / Self Care | Payer: Medicare HMO | Attending: Emergency Medicine | Admitting: Emergency Medicine

## 2021-01-29 ENCOUNTER — Encounter (HOSPITAL_COMMUNITY): Payer: Self-pay

## 2021-01-29 ENCOUNTER — Other Ambulatory Visit: Payer: Self-pay

## 2021-01-29 DIAGNOSIS — J449 Chronic obstructive pulmonary disease, unspecified: Secondary | ICD-10-CM | POA: Insufficient documentation

## 2021-01-29 DIAGNOSIS — Z7982 Long term (current) use of aspirin: Secondary | ICD-10-CM | POA: Diagnosis not present

## 2021-01-29 DIAGNOSIS — M25522 Pain in left elbow: Secondary | ICD-10-CM | POA: Diagnosis not present

## 2021-01-29 DIAGNOSIS — M79601 Pain in right arm: Secondary | ICD-10-CM | POA: Insufficient documentation

## 2021-01-29 DIAGNOSIS — M79621 Pain in right upper arm: Secondary | ICD-10-CM

## 2021-01-29 DIAGNOSIS — Z7901 Long term (current) use of anticoagulants: Secondary | ICD-10-CM | POA: Diagnosis not present

## 2021-01-29 MED ORDER — DEXAMETHASONE SODIUM PHOSPHATE 10 MG/ML IJ SOLN
10.0000 mg | Freq: Once | INTRAMUSCULAR | Status: AC
  Administered 2021-01-29: 10 mg via INTRAMUSCULAR
  Filled 2021-01-29: qty 1

## 2021-01-29 NOTE — Discharge Instructions (Addendum)
Schedule appointment with with Dr. Doreatha Martin within the next 2-4 weeks.  Return if new or worsening symptoms.

## 2021-01-29 NOTE — ED Notes (Signed)
Patient transported to X-ray 

## 2021-01-29 NOTE — ED Triage Notes (Signed)
Pt arrives with c/o right elbow pain that started about a month ago. Per pt, her PCP has not made a referral for her to go see a ortho doctor.

## 2021-01-29 NOTE — ED Provider Notes (Signed)
[facility] Provider [facility]   CSN: [id number] Arrival date & time: 01/29/21  1609     History  Chief Complaint  Patient presents with   Arm Pain     Lindsay RistDona Bradford is a 61 y.o. female with a history of factor V Leiden, ischemic cardiomyopathy, cardiac stent placement, peripheral arterial disease, history of DVT, history of CVA, COPD, GERD, osteopenia presents today with pain of the left elbow and right upper arm.  Patient states her right arm pain is much worse than her left elbow pain.  Her left elbow pain [facility] her left shoulder and left elbow on 12/31/2020.  Her left shoulder was assessed and treated but her left elbow pain remained.  Pain is localized to the elbow and is worsened by contact, movement, or long periods of immobility.  Pain described as throbbing.  Tylenol and Vaseline lotion provide no relief.  Her right upper arm pain originates from the distal third of her tricep area and radiates down to her wrist.  Pain described as constant and sharp, and is worsened by contact.  Tylenol and Vaseline lotion again provide no relief.  Patient states her primary care did not set her up with an orthopedic office as she requested and only provided her with 10 tablets of hydrocodone.  She stated these helped but she is now out of them.  She expresses she wants a referral to orthopedics and pain management.    The history is provided by the patient and medical records.     Home Medications Prior to Admission medications   Medication Sig Start Date End Date Taking? Authorizing Provider  albuterol (VENTOLIN HFA) 108 (90 Base) MCG/ACT inhaler Inhale 1-2 puffs into the lungs every 6 (six) hours as needed for wheezing or shortness of breath. 04/26/20   [facility] RodneyHawks, Christy A, FNP  aspirin EC 81 MG EC tablet Take 1 tablet (81 mg total) by mouth daily. 12/18/18   [facility], Jackquline DenmarkJohn T, MD  busPIRone (BUSPAR) 7.5 MG tablet TAKE 1  TABLET TWICE DAILY 01/06/21   [facility] RodneyHawks, Christy A, FNP  Calcium Carbonate-Vitamin D 600-400 MG-UNIT tablet Take by mouth.    [provider]  nitroGLYCERIN (NITROSTAT) 0.4 MG SL tablet Place 1 tablet (0.4 mg total) under the tongue every 5 (five) minutes as needed for chest pain. 12/17/18   [facility], Jackquline DenmarkJohn T, MD  omega-3 acid ethyl esters (LOVAZA) 1 g capsule Take 1 capsule (1 g total) by mouth 2 (two) times daily. 12/17/18   [facility], Jackquline DenmarkJohn T, MD  pantoprazole (PROTONIX) 40 MG tablet Take 1 tablet (40 mg total) by mouth daily. 09/15/20   [facility] SpencerHawks, Christy A, FNP  ranolazine (RANEXA) 500 MG 12 hr tablet Take 500 mg by mouth 2 (two) times daily.    [provider]  rivaroxaban (XARELTO) 20 MG TABS tablet Take 1 tablet (20 mg total) by mouth daily. 12/18/18   [facility], Jackquline DenmarkJohn T, MD  rosuvastatin (CRESTOR) 40 MG tablet TAKE 1 TABLET EVERY DAY 11/29/20   [facility] RodneyHawks, Christy A, FNP  topiramate (TOPAMAX) 100 MG tablet TAKE 1 TABLET EVERY DAY 11/29/20   [facility] RodneyHawks, Christy A, FNP  traZODone (DESYREL) 150 MG tablet USE FROM 1/3 TO 1 TABLET NIGHTLY AS NEEDED FOR SLEEP 10/28/20   [facility] RodneyHawks, Christy A, FNP  valACYclovir (VALTREX) 500 MG tablet TAKE 1 TABLET EVERY DAY 09/01/20   [facility] RodneyHawks, Christy A, FNP  venlafaxine XR (EFFEXOR-XR) 150 MG 24 hr capsule TAKE 1 CAPSULE  EVERY DAY WITH BREAKFAST 11/29/20   [facility] Rodney A, FNP      Allergies    Cephalexin, Clarithromycin, Ibuprofen, Other, Oxymorphone, Penicillins, Potassium chloride, Sulfa antibiotics, Clindamycin, Fentanyl, Naproxen, Iodinated contrast media, Lyrica [pregabalin], and Tetracycline    Review of Systems   Review of Systems  Constitutional:  Negative for diaphoresis and fever.  Respiratory:  Negative for shortness of breath.   Cardiovascular:  Negative for chest pain.  Musculoskeletal:  Positive for arthralgias and myalgias. Negative for joint swelling.  Skin:  Negative for color change, pallor, rash and wound.  Neurological:  Negative for speech difficulty  and numbness.   Physical Exam Updated Vital Signs BP (!) 121/94 (BP Location: Left Arm)    Pulse (!) 102    Temp 98.3 F (36.8 C) (Oral)    Resp 18    Wt 91.2 kg    SpO2 97%    BMI 31.48 kg/m  Physical Exam Vitals and nursing note reviewed.  Constitutional:      General: She is not in acute distress.    Appearance: Normal appearance. She is well-developed. She is not ill-appearing.  HENT:     Head: Normocephalic and atraumatic.  Eyes:     Conjunctiva/sclera: Conjunctivae normal.  Cardiovascular:     Rate and Rhythm: Normal rate and regular rhythm.     Pulses: Normal pulses.          Radial pulses are 2+ on the right side and 2+ on the left side.     Heart sounds: Normal heart sounds.     Comments: HR 90 during physical exam Pulmonary:     Effort: Pulmonary effort is normal. No respiratory distress.     Breath sounds: Normal breath sounds. No wheezing.  Musculoskeletal:        General: Tenderness present. No swelling or deformity.     Right upper arm: Tenderness present. No swelling, edema, deformity, lacerations or bony tenderness.     Left upper arm: Normal.     Right elbow: Normal.     Left elbow: No swelling, deformity, effusion or lacerations. Normal range of motion. Tenderness present in olecranon process. No medial epicondyle or lateral epicondyle tenderness.     Cervical back: Neck supple.     Right lower leg: No edema.     Left lower leg: No edema.  Skin:    General: Skin is warm and dry.     Capillary Refill: Capillary refill takes less than 2 seconds.     Coloration: Skin is not pale.     Findings: No bruising, erythema or lesion.  Neurological:     Mental Status: She is alert and oriented to person, place, and time.  Psychiatric:        Mood and Affect: Mood normal.    ED Results / Procedures / Treatments   Labs (all labs ordered are listed, but only abnormal results are displayed) Labs Reviewed - No data to display  EKG None  Radiology No results  found.  Procedures Procedures    Medications Ordered in ED Medications - No data to display  ED Course/ Medical Decision Making/ A&P                           Medical Decision Making 61 year old female patient being assessed for right arm pain and left elbow pain.  DDx includes musculoskeletal pain, fracture, joint effusion, septic arthritis, trauma.  She has a history of factor V  Leiden, CVA, myocardial infarction, malignancy, and osteopenia.  Personally interpreted all x-ray images and vitals.  Explained these results to the patient in detail and she demonstrated understanding by repeating these back to me.  Left elbow is not warm to the touch and does not appear erythematous or edematous.  No mass noted on physical exam.  X-ray of the left elbow does not show signs of a fracture or joint effusion or significant pathology.  I do not believe she has a hematoma or infection to the area based on presentation and physical exam.  She has been experiencing this pain for several months and physical exam is inconsistent.    Right arm is not warm to the touch and does not appear erythematous or edematous.  No mass noted on physical exam.  X-ray of the right humerus does not show signs of a fracture or joint effusion in nearby joints, or any significant pathology.  Her epidermal layer is intact without obvious signs of infection, she is afebrile, and not tachycardic.  She has been experiencing this pain for several months and physical exam is inconsistent as well.  Patient expresses she just wants to get an appointment with orthopedics for further evaluation and to receive pain relief.  She has an extensive allergy list including NSAIDs, oxymorphone, and fentanyl.  She is using her phone, demonstrates full range of motion, is smiling and laughing, and does not appear to be in significant distress or pain.  Believe a dose of Decadron may provide her relief, and that she may see orthopedics to further  evaluate her pain.  Based on these results, pain of left elbow and right arm likely of benign origin.  Feel comfortable discharging the patient home.  Explained the course of treatment with the patient and the patient understands.  She is agreeable to the plan and has no further questions.  Amount and/or Complexity of Data Reviewed Labs: ordered. Decision-making details documented in ED Course. Radiology: ordered and independent interpretation performed. Decision-making details documented in ED Course. ECG/medicine tests: ordered and independent interpretation performed. Decision-making details documented in ED Course.         Final Clinical Impression(s) / ED Diagnoses Final diagnoses:  None    Rx / DC Orders ED Discharge Orders     None         Sandrea Hammond 01/30/21 0117    Eber Hong, MD 01/30/21 2209

## 2021-02-07 ENCOUNTER — Ambulatory Visit (INDEPENDENT_AMBULATORY_CARE_PROVIDER_SITE_OTHER): Payer: Medicare HMO | Admitting: Orthopedic Surgery

## 2021-02-07 ENCOUNTER — Encounter: Payer: Self-pay | Admitting: Orthopedic Surgery

## 2021-02-07 ENCOUNTER — Ambulatory Visit: Payer: Medicare HMO

## 2021-02-07 ENCOUNTER — Other Ambulatory Visit: Payer: Self-pay

## 2021-02-07 VITALS — BP 114/93 | HR 106 | Ht 67.0 in | Wt 199.0 lb

## 2021-02-07 DIAGNOSIS — G5623 Lesion of ulnar nerve, bilateral upper limbs: Secondary | ICD-10-CM

## 2021-02-07 DIAGNOSIS — M25521 Pain in right elbow: Secondary | ICD-10-CM

## 2021-02-07 NOTE — Patient Instructions (Signed)
We will place a referral to occupational therapy to make some splints to help ease the pressure on your ulnar nerve in the elbows

## 2021-02-07 NOTE — Progress Notes (Signed)
New Patient Visit  Assessment: Lindsay Bradford is a 61 y.o. female with the following: Pain in bilateral elbows; tender to palpation, some shooting pains distally  Plan: Patient has pain in both elbows, with some radiating pains distally.  Occasional numbness or tingling type sensations in the ulnar aspect of the right hand.  Based on her current presentation, it is most consistent with cubital tunnel.  She has excellent strength in bilateral hands.  As result, I think her symptoms are overall mild.  She also has some olecranon bursitis of the left elbow, with minimal swelling.  She has multiple other complaints at this time, including a right ankle pain status postsurgery by Dr. Jena Gauss.  She also has left greater trochanteric bursitis, and has received multiple injections in the past.  I have advised her to follow-up with Dr. Jena Gauss, but we are available to assist if she needs something further.  In regards to her left hip, I have advised her to focus on the elbows for now, and she can schedule another appointment to address the left hip specifically.   Follow-up: Return if symptoms worsen or fail to improve.  Subjective:  Chief Complaint  Patient presents with   Elbow Pain    Right, pain goes all way down to hand Left, hurts to put pressure on it    History of Present Illness: Lindsay Bradford is a 61 y.o. female who has been referred by Gilman Schmidt, NP for evaluation of right elbow and right hand pain.  She has had pain in the right elbow, as well as the left elbow for several months.  No specific injury.  Occasionally she has pain radiating into her right hand.  She also notes some numbness and tingling at times in the ring finger.  The pain in the left elbow is similar, but has severe tenderness to palpation directly over the olecranon.  She has been taking Tylenol without improvement.  Gabapentin is not helping her pain.  No prior therapies.  No previous injection.  Of note, she has a  history of ORIF of the right ankle, in 2019 by Dr. Jena Gauss.  She also has a history of left greater trochanteric bursitis, will for which she has received multiple steroid injections.   Review of Systems: No fevers or chills No numbness or tingling No chest pain No shortness of breath No bowel or bladder dysfunction No GI distress No headaches   Medical History:  Past Medical History:  Diagnosis Date   Arthritis    Asthma    Bursitis of left hip    COPD (chronic obstructive pulmonary disease) (HCC)    Coronary artery disease    Depression    DVT (deep venous thrombosis) (HCC)    left   Factor V Leiden (HCC)    Fibromyalgia    GERD (gastroesophageal reflux disease)    Glaucoma    Headache    sesaonal   Herpes simplex type 2 infection    History of blood transfusion    Hyperlipidemia    Hypotension    90's, Normally; in pain normal; BP   Ischemic cardiomyopathy    MI (myocardial infarction) (HCC)    x 2 2000    Pneumonia    Stroke (HCC) 02/27/2018   no residual    Past Surgical History:  Procedure Laterality Date   ABDOMINAL HYSTERECTOMY     APPENDECTOMY     CARDIAC CATHETERIZATION     COLONOSCOPY     FOOT SURGERY Bilateral  LOOP RECORDER IMPLANT  06/20/2018   [facility]    ORIF ANKLE FRACTURE Right 10/18/2018   Procedure: OPEN REDUCTION INTERNAL FIXATION (ORIF) ANKLE FRACTURE;  Surgeon: Roby Lofts, MD;  Location: [facility] OR;  Service: Orthopedics;  Laterality: Right;   SHOULDER SURGERY Right    VEIN BYPASS SURGERY Left     Family History  Problem Relation Age of Onset   Arthritis Mother    Cancer Mother    Colon cancer Mother    Breast cancer Mother    COPD Father    COPD Brother    Heart disease Sister    COPD Brother    Stroke Brother    Heart disease Brother    Clotting disorder Brother    Colon cancer Maternal Grandmother    Cancer Maternal Grandmother    Breast cancer Maternal Grandmother    Cancer Paternal Grandmother    Colon cancer  Paternal Grandmother    Cancer Nephew    Colon cancer Nephew    Social History   Tobacco Use   Smoking status: Former    Packs/day: 0.50    Years: 35.00    Pack years: 17.50    Types: Cigarettes    Quit date: 04/2020    Years since quitting: 0.8   Smokeless tobacco: Never  Vaping Use   Vaping Use: Some days   Substances: Flavoring  Substance Use Topics   Alcohol use: Not Currently   Drug use: Not Currently    Allergies  Allergen Reactions   Cephalexin Hives   Clarithromycin Hives   Ibuprofen Hives   Other Itching and Other (See Comments)    oranges   Oxymorphone Palpitations   Penicillins Anaphylaxis    Did it involve swelling of the face/tongue/throat, SOB, or low BP? Yes Did it involve sudden or severe rash/hives, skin peeling, or any reaction on the inside of your mouth or nose? Yes Did you need to seek medical attention at a hospital or doctor's office? Yes When did it last happen?      childhood allergy If all above answers are NO, may proceed with cephalosporin use.    Potassium Chloride Hives    Reacts to oral KCl, tolerates it slow IV    Sulfa Antibiotics Anaphylaxis   Clindamycin Hives and Itching   Fentanyl Hives and Other (See Comments)    Fentanyl patch, it breaks pt out in hives and lowers her blood pressure.    Naproxen Hives   Iodinated Contrast Media Hives   Lyrica [Pregabalin]     Chest pain and very irritable and "felt out of this world"   Tetracycline Hives and Itching    Current Meds  Medication Sig   albuterol (VENTOLIN HFA) 108 (90 Base) MCG/ACT inhaler Inhale 1-2 puffs into the lungs every 6 (six) hours as needed for wheezing or shortness of breath.   aspirin EC 81 MG EC tablet Take 1 tablet (81 mg total) by mouth daily.   busPIRone (BUSPAR) 7.5 MG tablet TAKE 1 TABLET TWICE DAILY   Calcium Carbonate-Vitamin D 600-400 MG-UNIT tablet Take by mouth.   nitroGLYCERIN (NITROSTAT) 0.4 MG SL tablet Place 1 tablet (0.4 mg total) under the  tongue every 5 (five) minutes as needed for chest pain.   omega-3 acid ethyl esters (LOVAZA) 1 g capsule Take 1 capsule (1 g total) by mouth 2 (two) times daily.   pantoprazole (PROTONIX) 40 MG tablet Take 1 tablet (40 mg total) by mouth daily.   ranolazine (RANEXA) 500 MG  12 hr tablet Take 500 mg by mouth 2 (two) times daily.   rivaroxaban (XARELTO) 20 MG TABS tablet Take 1 tablet (20 mg total) by mouth daily.   rosuvastatin (CRESTOR) 40 MG tablet TAKE 1 TABLET EVERY DAY   topiramate (TOPAMAX) 100 MG tablet TAKE 1 TABLET EVERY DAY   traZODone (DESYREL) 150 MG tablet USE FROM 1/3 TO 1 TABLET NIGHTLY AS NEEDED FOR SLEEP   valACYclovir (VALTREX) 500 MG tablet TAKE 1 TABLET EVERY DAY   venlafaxine XR (EFFEXOR-XR) 150 MG 24 hr capsule TAKE 1 CAPSULE EVERY DAY WITH BREAKFAST    Objective: BP (!) 114/93    Pulse (!) 106    Ht 5\' 7"  (1.702 m)    Wt 199 lb (90.3 kg)    BMI 31.17 kg/m   Physical Exam:  General: Alert and oriented. and No acute distress.  Older than stated age Gait: Left sided antalgic gait.  Evaluation of the right elbow demonstrates no swelling.  No erythema.  No bruising is appreciated.  Exquisite tenderness to palpation in line with the ulnar nerve.  Pain with Tinel's testing.  Mild radiating pains into the right hand with Tinel's testing.  Positive Phalen's at the elbow.  Evaluation of the left elbow demonstrates tenderness to palpation over the ulnar nerve.  No deformities appreciated.  Exquisite tenderness to palpation directly over the olecranon.  There is no swelling this area.  There is a healing scab.  Right ankle with well-healed surgical incisions.  IMAGING: I personally ordered and reviewed the following images  X-rays of the right elbow were obtained in clinic today.  No acute injuries are noted.  No evidence of remote injuries.  Elbow is reduced.  Minimal degenerative changes noted.  No soft tissue swelling.  Impression: Normal right elbow x-rays  X-ray of the  right hand demonstrates minimal degenerative changes.  No acute injuries.  No dislocations.  Left elbow x-rays negative.   New Medications:  No orders of the defined types were placed in this encounter.     Oliver BarreMark A Nashika Coker, MD  02/07/2021 9:53 PM

## 2021-02-15 ENCOUNTER — Ambulatory Visit: Payer: Medicare HMO

## 2021-02-15 ENCOUNTER — Other Ambulatory Visit: Payer: Self-pay

## 2021-02-15 ENCOUNTER — Ambulatory Visit (INDEPENDENT_AMBULATORY_CARE_PROVIDER_SITE_OTHER): Payer: Medicare HMO | Admitting: Orthopedic Surgery

## 2021-02-15 ENCOUNTER — Encounter: Payer: Self-pay | Admitting: Orthopedic Surgery

## 2021-02-15 VITALS — BP 122/83 | HR 104 | Ht 67.0 in | Wt 197.0 lb

## 2021-02-15 DIAGNOSIS — M7062 Trochanteric bursitis, left hip: Secondary | ICD-10-CM

## 2021-02-15 DIAGNOSIS — M25559 Pain in unspecified hip: Secondary | ICD-10-CM

## 2021-02-15 MED ORDER — PREDNISONE 10 MG (21) PO TBPK: ORAL_TABLET | ORAL | 0 refills | Status: DC

## 2021-02-15 NOTE — Patient Instructions (Signed)

## 2021-02-15 NOTE — Progress Notes (Signed)
Orthopaedic Clinic Return  Assessment: Lindsay Bradford is a 61 y.o. female with the following: Left greater trochanteric bursitis Right elbow pain; triceps tendinitis versus cubital tunnel syndrome   Plan: Patient has had tenderness of the lateral hip for a couple of years.  She has responded well to injections in the past.  She would like to proceed with another injection in clinic today.  She continues to have right elbow pain.  She has not seen the occupational therapist for fabrication of an cubital tunnel splint.  Tylenol and tramadol are not improving her symptoms.  I recommended a prednisone Dosepak for her acute pain.  Procedure note injection - Left lateral hip   Verbal consent was obtained to inject the Left lateral hip.  Patient localized the pain. Timeout was completed to confirm the site of injection.  The skin was prepped with alcohol and ethyl chloride was sprayed at the injection site.  A 21-gauge needle was used to inject 40 mg of Depo-Medrol and 1% lidocaine (4 cc) into the Left lateral hip, directly over the localized tenderness using a direct lateral approach.  There were no complications. A sterile bandage was applied.   Meds ordered this encounter  Medications   predniSONE (STERAPRED UNI-PAK 21 TAB) 10 MG (21) TBPK tablet    Sig: 10 mg DS 12 as directed    Dispense:  48 tablet    Refill:  0    Body mass index is 30.85 kg/m.  Follow-up: Return if symptoms worsen or fail to improve.   Subjective:  Chief Complaint  Patient presents with   Hip Pain    Lt hip pain getting worse over the past year. Has had steroid injections in the hip before for this pain.     History of Present Illness: Lindsay Bradford is a 61 y.o. female who returns to clinic for evaluation of left lateral hip pain.  I saw her last week for bilateral elbow pain.  She is not yet seen an occupational therapist.  She is complaining of pain in the right elbow in particular.  This gets worse at  night.  She is taking Tylenol and tramadol, with limited improvement in her  She also is tenderness over the lateral aspect of her left hip.  She is doing this intermittently for at least a year.  She has previously had injections in the left hip, which provided excellent relief.  She is interested in proceeding with another injection today if possible.  Review of Systems: No fevers or chills No numbness or tingling No chest pain No shortness of breath No bowel or bladder dysfunction No GI distress No headaches   Objective: BP 122/83    Pulse (!) 104    Ht 5\' 7"  (1.702 m)    Wt 197 lb (89.4 kg)    BMI 30.85 kg/m   Physical Exam:  Right elbow without swelling.  Triceps tendon is palpable, and is intact based on my exam.  She does have some tenderness to palpation within the body of the tendon.  She also tenderness palpation with direct palpation of the ulnar nerve.  Mild Tinel's at the cubital tunnel.  Evaluation left hip demonstrates no deformity.  She is tender to palpation directly over the greater trochanter, on the lateral side of the hip.  She tolerates gentle range of motion of the hip.  Pain does not get worse with straight leg raise.  Toes are warm and well-perfused.  IMAGING: I personally ordered and reviewed the  following images:  AP pelvis, and left hip x-ray were obtained in clinic today.  There is evidence of a prior lumbar fusion, without obvious hardware failure.  Mild loss of joint space within the left hip joint.  Minimal osteophytes are noted.  No evidence of an acute injury.  No calcium deposition over the greater trochanter.  Impression: Left hip with mild-moderate degenerative changes including loss of joint space   Oliver Barre, MD 02/15/2021 10:45 PM

## 2021-02-21 ENCOUNTER — Ambulatory Visit (HOSPITAL_COMMUNITY): Payer: Medicare HMO | Attending: Orthopedic Surgery

## 2021-02-21 ENCOUNTER — Other Ambulatory Visit: Payer: Self-pay

## 2021-02-21 ENCOUNTER — Encounter (HOSPITAL_COMMUNITY): Payer: Self-pay

## 2021-02-21 DIAGNOSIS — M25521 Pain in right elbow: Secondary | ICD-10-CM | POA: Diagnosis present

## 2021-02-21 DIAGNOSIS — G5623 Lesion of ulnar nerve, bilateral upper limbs: Secondary | ICD-10-CM | POA: Diagnosis not present

## 2021-02-21 DIAGNOSIS — M25522 Pain in left elbow: Secondary | ICD-10-CM | POA: Diagnosis not present

## 2021-02-21 NOTE — Patient Instructions (Signed)
Your Splint °This splint should initially be fitted by a healthcare practitioner.  The healthcare practitioner is responsible for providing wearing instructions and precautions to the patient, other healthcare practitioners and care provider involved in the patient's care.  This splint was custom made for you. Please read the following instructions to learn about wearing and caring for your splint. ° °Precautions °Should your splint cause any of the following problems, remove the splint immediately and contact your therapist/physician. °Swelling °Severe Pain °Pressure Areas °Stiffness °Numbness ° °Do not wear your splint while operating machinery unless it has been fabricated for that purpose. ° °When To Wear Your Splint °Where your splint according to your therapist/physician instructions. °Nighttime only. ° °Care and Cleaning of Your Splint °Keep your splint away from open flames. °Your splint will lose its shape in temperatures over 135 degrees Farenheit, ( in car windows, near radiators, ovens or in hot water).  Never make any adjustments to your splint, if the splint needs adjusting remove it and make an appointment to see your therapist. °Your splint, including the cushion liner may be cleaned with soap and lukewarm water.  Do not immerse in hot water over 135 degrees Farenheit. °Straps may be washed with soap and water, but do not moisten the self-adhesive portion. °For ink or hard to remove spots use a scouring cleanser which contains chlorine.  Rinse the splint thoroughly after using chlorine cleanser. ° °

## 2021-02-22 NOTE — Therapy (Signed)
[facility] [address] [place], [place], [address] Phone: [phone]   Fax:  [phone]  Occupational Therapy Evaluation  Patient Details  Name: Lindsay Bradford MRN: [id number] Date of Birth: 1960-03-29 Referring Provider (OT): Dr. Thane Edu   Encounter Date: 02/21/2021   OT End of Session - 02/22/21 1604     Visit Number 1    Number of Visits 1    Authorization Type [employer]    Authorization Time Period no copay, no visit limit    Progress Note Due on Visit 10    OT Start Time 1115    OT Stop Time 1215    OT Time Calculation (min) 60 min    Activity Tolerance Patient tolerated treatment well    Behavior During Therapy WFL for tasks assessed/performed             Past Medical History:  Diagnosis Date   Arthritis    Asthma    Bursitis of left hip    COPD (chronic obstructive pulmonary disease) (HCC)    Coronary artery disease    Depression    DVT (deep venous thrombosis) (HCC)    left   Factor V Leiden (HCC)    Fibromyalgia    GERD (gastroesophageal reflux disease)    Glaucoma    Headache    sesaonal   Herpes simplex type 2 infection    History of blood transfusion    Hyperlipidemia    Hypotension    90's, Normally; in pain normal; BP   Ischemic cardiomyopathy    MI (myocardial infarction) (HCC)    x 2 2000    Pneumonia    Stroke (HCC) 02/27/2018   no residual    Past Surgical History:  Procedure Laterality Date   ABDOMINAL HYSTERECTOMY     APPENDECTOMY     CARDIAC CATHETERIZATION     COLONOSCOPY     FOOT SURGERY Bilateral    LOOP RECORDER IMPLANT  06/20/2018   [facility]    ORIF ANKLE FRACTURE Right 10/18/2018   Procedure: OPEN REDUCTION INTERNAL FIXATION (ORIF) ANKLE FRACTURE;  Surgeon: Roby Lofts, MD;  Location: [facility] OR;  Service: Orthopedics;  Laterality: Right;   SHOULDER SURGERY Right    VEIN BYPASS SURGERY Left     There were no vitals filed for this visit.   Subjective Assessment -  02/21/21 1128     Subjective  S: It hurts all the time mostly.    Pertinent History Patient is a 61 y/o female presenting to occupational therapy for splint fabrication for bilateral cubital tunnel.    Patient Stated Goals to help decrease the pain in elbows.    Currently in Pain? Yes    Pain Score 7    8/10 left   Pain Location Elbow    Pain Orientation Right    Pain Descriptors / Indicators Sore;Aching;Constant    Pain Type Acute pain    Pain Onset More than a month ago    Pain Frequency Constant    Aggravating Factors  sleeping               [facility] OT Assessment - 02/22/21 1558       Assessment   Medical Diagnosis bilateral cubital tunnel syndrome    Referring Provider (OT) Dr. Thane Edu    Onset Date/Surgical Date --   ongoing for the past several months. No onset date provided.   Hand Dominance Right    Next MD Visit 02/12/21  Prior Therapy None      Precautions   Precautions None      Restrictions   Weight Bearing Restrictions No      Home  Environment   Family/patient expects to be discharged to: Private residence      Prior Function   Level of Independence Independent;Other (comment)   Patient has a service dog   Leisure Crochets      ADL   ADL comments Reports pain throughout the day and night.      Vision - History   Baseline Vision Wears glasses all the time      Cognition   Overall Cognitive Status Within Functional Limits for tasks assessed      Observation/Other Assessments   Focus on Therapeutic Outcomes (FOTO)  N/A      ROM / Strength   AROM / PROM / Strength AROM;Strength      AROM   Overall AROM  Within functional limits for tasks performed    Overall AROM Comments Demonstrate full A/ROM of bilateral elbows, forearm, and wrists      Strength   Overall Strength Within functional limits for tasks performed    Overall Strength Comments Functional strength demonstrated in BUE.                      OT  Treatments/Exercises (OP) - 02/22/21 0001       Splinting   Splinting Two elbow extension splints fabricated. Three 2in straps placed to secure. Labeled each for right or left arm.                    OT Education - 02/22/21 1603     Education Details reviewed caring for splint, cleaning, wearing schedule, precautions, and donning/doffing method.    Person(s) Educated Patient    Methods Explanation;Demonstration;Handout;Verbal cues    Comprehension Verbalized understanding;Returned demonstration                        Plan - 02/22/21 1605     Clinical Impression Statement A: Pt was provided with two fabricated elbow extension splints. All education was reviewed. patient was able to verbalize and/or demonstrate understanding. Pt is aware to contact the clinic if any adjustments are needed.    OT Occupational Profile and History Problem Focused Assessment - Including review of records relating to presenting problem    Occupational performance deficits (Please refer to evaluation for details): ADL's;IADL's;Rest and Sleep;Leisure    Games developer / Function / Physical Skills Pain    Rehab Potential Excellent    Clinical Decision Making Limited treatment options, no task modification necessary    Comorbidities Affecting Occupational Performance: Presence of comorbidities impacting occupational performance    Comorbidities impacting occupational performance description: see medical chart    Modification or Assistance to Complete Evaluation  No modification of tasks or assist necessary to complete eval    OT Frequency One time visit    OT Treatment/Interventions Splinting;Patient/family education    Plan P: One time visit for splint fabrication. Patient to follow up with Dr. Vernie Ammons and Agree with Plan of Care Patient             Patient will benefit from skilled therapeutic intervention in order to improve the following deficits and impairments:    Body Structure / Function / Physical Skills: Pain       Visit Diagnosis: Pain in left elbow - Plan:  Ot plan of care cert/re-cert  Pain in right elbow - Plan: Ot plan of care cert/re-cert    Problem List Patient Active Problem List   Diagnosis Date Noted   Migraine without aura and without status migrainosus, not intractable 05/31/2020   GAD (generalized anxiety disorder) 04/27/2020   COPD with acute exacerbation (HCC) 03/25/2020   Acute respiratory failure with hypoxia (HCC) 03/25/2020   Insomnia 02/17/2019   Opiate abuse, episodic (HCC) 12/17/2018   Major depressive disorder, recurrent severe without psychotic features (HCC) 12/12/2018   GERD (gastroesophageal reflux disease) 12/05/2018   Closed trimalleolar fracture of ankle, right, initial encounter 10/18/2018   Closed displaced trimalleolar fracture of right lower leg 10/15/2018   Coronary artery disease involving native coronary artery of native heart 08/26/2018   Obesity (BMI 30-39.9) 08/26/2018   NSVT (nonsustained ventricular tachycardia) 06/18/2018   Ischemic cardiomyopathy 03/13/2018   S/P coronary artery stent placement 03/13/2018   Coronary artery disease with history of myocardial infarction without history of CABG 03/13/2018   DVT (deep venous thrombosis) (HCC) 08/14/2017   Dyslipidemia 08/02/2017   Peripheral arterial disease (HCC) 06/06/2017   Mallet toe, acquired, left 02/08/2017   Abdominal wall hernia 08/31/2016   CAD S/P percutaneous coronary angioplasty 08/25/2016   Allergy to NSAIDs 03/09/2014   Closed fracture of distal end of right ulna 02/12/2014   Spondylolisthesis of lumbosacral region 01/14/2014   Arthritis 04/05/2012   History of DVT (deep vein thrombosis) 02/02/2012   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 [place], [place], [address] Phone: [phone]   Fax:  [phone]  Name: Lindsay Bradford MRN: [id number] Date of Birth: November 26, 1960

## 2021-03-17 ENCOUNTER — Ambulatory Visit: Payer: Self-pay | Admitting: Student

## 2021-03-25 ENCOUNTER — Telehealth: Payer: Self-pay | Admitting: Orthopedic Surgery

## 2021-03-25 NOTE — Telephone Encounter (Signed)
Patient called to relay that her shoulder pain is continuing; said nothing is helping. Please advise if sooner appointment or other recommendation? ?

## 2021-03-29 NOTE — Telephone Encounter (Signed)
I called patient and she says her shld does not hurt, it is her elbow and hands.  Tylenol, cannot sleep, brace she has been wearing at night makes it worse.  She got a different brace that helps some with the pain,  cannot sleep, a lot of pain.  Pain moves from elbow to hands, has arthritis hands.  Can't raise arms at elbows, or lift- it hurts to do anything.   Please advise. ?

## 2021-04-01 ENCOUNTER — Encounter: Payer: Self-pay | Admitting: Orthopedic Surgery

## 2021-04-01 ENCOUNTER — Ambulatory Visit (INDEPENDENT_AMBULATORY_CARE_PROVIDER_SITE_OTHER): Payer: Medicare HMO | Admitting: Orthopedic Surgery

## 2021-04-01 ENCOUNTER — Other Ambulatory Visit: Payer: Self-pay

## 2021-04-01 DIAGNOSIS — M25521 Pain in right elbow: Secondary | ICD-10-CM | POA: Diagnosis not present

## 2021-04-01 MED ORDER — PREDNISONE 10 MG (21) PO TBPK: ORAL_TABLET | ORAL | 0 refills | Status: DC

## 2021-04-01 NOTE — Progress Notes (Signed)
Orthopaedic Clinic Return ? ?Assessment: ?Lindsay Bradford is a 61 y.o. female with the following: ?Posterior right elbow pain ? ? ?Plan: ?Mrs. Lindsay Bradford is complaining of severe posterior right elbow pain.  She was previously evaluated, and had associated symptoms including numbness into the right hand.  As such, she has tried a cubital tunnel brace.  This worsened the pain in the posterior right elbow.  No specific injury.  On physical exam today, she has mild swelling over the posterior elbow.  No redness.  No fluctuance.  She is able to extend the elbow against gravity.  She does not have full range of motion at the elbow.  At this point, I am not certain what is causing her severe pain.  This was discussed with the patient.  She states she had some relief with a recent prednisone Dosepak.  As result, I have provided her with a repeat prescription of this medication.  If she continues to have issues, we will have to consider obtaining an MRI. ? ? ?Meds ordered this encounter  ?Medications  ? predniSONE (STERAPRED UNI-PAK 21 TAB) 10 MG (21) TBPK tablet  ?  Sig: 10 mg DS 12 as directed  ?  Dispense:  48 tablet  ?  Refill:  0  ? ? ?Follow-up: ?Return if symptoms worsen or fail to improve. ? ? ?Subjective: ? ?Chief Complaint  ?Patient presents with  ? Elbow Pain  ?  R/ pain is so bad its waking me at night and I am crying.  ? Hand Pain  ?  L/ hurts a lot, the weather doesn't help. It is making daily activities difficult for me, like cooking, doing dishes,etc.   ? ? ?History of Present Illness: ?Lindsay Bradford is a 61 y.o. female who returns to clinic for evaluation of l right elbow pain.  When I saw her last, she was having some pain around the elbow, as well as radiating numbness and tingling in the hand.  As result, she was diagnosed with cubital tunnel syndrome, and she had a brace made for her right elbow.  She tried wearing the brace, but this made her pain worse.  The pain is in the posterior aspect of her right  elbow.  No specific injury.  She is tried over-the-counter medications, as well as some pain medications without improvement.  The pain is severe.  She states that it wakes her up at night.  She also has pain in the left third metacarpal joint, with some associated swelling.  She knows she has arthritis in this area. ? ?Review of Systems: ?No fevers or chills ?No numbness or tingling ?No chest pain ?No shortness of breath ?No bowel or bladder dysfunction ?No GI distress ?No headaches ? ? ?Objective: ?There were no vitals taken for this visit. ? ?Physical Exam: ? ?Evaluation of the right elbow demonstrates no deformity.  Mild swelling is appreciated over the posterior elbow, just proximal to the olecranon.  No redness.  No fluctuance is appreciated.  She is unable to achieve full extension of the right elbow.  Fingers warm and well-perfused. ? ?On the left hand, she has some swelling and mild bruising about the long finger MCP. ? ? ?IMAGING: ?I personally ordered and reviewed the following images: ? ?No new imaging obtained today. ? ? ?Oliver Barre, MD ?04/01/2021 ?1:06 PM ? ? ?

## 2021-04-01 NOTE — Patient Instructions (Signed)
Voltaren gel for pain in left hand and right elbow.  Available over the counter.  ?

## 2021-04-04 NOTE — H&P (Signed)
Orthopaedic Trauma Service (OTS) H&P ? ?Patient ID: ?Lindsay Bradford ?MRN: [id number] ?DOB/AGE: Apr 20, 1960 61 y.o. ? ?Reason for surgery: Hardware removal right ankle  ? ?HPI: Lindsay Bradford is an 61 y.o. female with medical history significant for COPD, coronary artery disease, factor V Leiden, history of DVT left lower extremity, and ischemic cardiomyopathy presenting for hardware removal from right ankle.  Patient sustained a fall in September 2020, resulting in a right trimalleolar ankle fracture.  Patient underwent ORIF of right ankle by Dr. Doreatha Martin on 10/18/2018.  She tolerated this well without complications.  Over the last 2-1/2 years patient has progressed well to fully weightbearing with no assistive device and has fully healed her fracture.  Over the last several months, patient notes that she is continue to have some right ankle pain.  She notes that the hardware is very prominent over her lateral ankle and she frequently hits this on chairs or other objects.  She presents now for hardware removal. ? ?Patient on Xarelto at baseline for factor V Leiden and history of DVT, has been compliant with this. Ambulates with no assistive device.  Smokes approximately 1 pack of cigarettes daily. ? ?Past Medical History:  ?Diagnosis Date  ? Arthritis   ? Asthma   ? Bursitis of left hip   ? COPD (chronic obstructive pulmonary disease) ([place])   ? Coronary artery disease   ? Depression   ? DVT (deep venous thrombosis) ([place])   ? left  ? Factor V Leiden ([place])   ? Fibromyalgia   ? GERD (gastroesophageal reflux disease)   ? Glaucoma   ? Headache   ? sesaonal  ? Herpes simplex type 2 infection   ? History of blood transfusion   ? Hyperlipidemia   ? Hypotension   ? 90's, Normally; in pain normal; BP  ? Ischemic cardiomyopathy   ? MI (myocardial infarction) ([place])   ? x 2 2000   ? Pneumonia   ? Stroke ([place]) 02/27/2018  ? no residual  ? ? ?Past Surgical History:  ?Procedure Laterality Date  ? ABDOMINAL HYSTERECTOMY    ? APPENDECTOMY     ? CARDIAC CATHETERIZATION    ? COLONOSCOPY    ? FOOT SURGERY Bilateral   ? LOOP RECORDER IMPLANT  06/20/2018  ? [facility]   ? ORIF ANKLE FRACTURE Right 10/18/2018  ? Procedure: OPEN REDUCTION INTERNAL FIXATION (ORIF) ANKLE FRACTURE;  Surgeon: Shona Needles, MD;  Location: [place];  Service: Orthopedics;  Laterality: Right;  ? SHOULDER SURGERY Right   ? VEIN BYPASS SURGERY Left   ? ? ?Family History  ?Problem Relation Age of Onset  ? Arthritis Mother   ? Cancer Mother   ? Colon cancer Mother   ? Breast cancer Mother   ? COPD Father   ? COPD Brother   ? Heart disease Sister   ? COPD Brother   ? Stroke Brother   ? Heart disease Brother   ? Clotting disorder Brother   ? Colon cancer Maternal Grandmother   ? Cancer Maternal Grandmother   ? Breast cancer Maternal Grandmother   ? Cancer Paternal Grandmother   ? Colon cancer Paternal Grandmother   ? Cancer Nephew   ? Colon cancer Nephew   ? ? ?Social History:  reports that she quit smoking about a year ago. Her smoking use included cigarettes. She has a 17.50 pack-year smoking history. She has never used smokeless tobacco. She reports that she does not currently use alcohol. She reports that she does  not currently use drugs. ? ?Allergies:  ?Allergies  ?Allergen Reactions  ? Cephalexin Hives  ? Clarithromycin Hives  ? Ibuprofen Hives  ? Other Itching and Other (See Comments)  ?  oranges  ? Oxymorphone Palpitations  ? Penicillins Anaphylaxis  ?  Did it involve swelling of the face/tongue/throat, SOB, or low BP? Yes ?Did it involve sudden or severe rash/hives, skin peeling, or any reaction on the inside of your mouth or nose? Yes ?Did you need to seek medical attention at a hospital or doctor's office? Yes ?When did it last happen?      childhood allergy ?If all above answers are ?NO?, may proceed with cephalosporin use. ?  ? Potassium Chloride Hives  ?  Reacts to oral KCl, tolerates it slow IV ?  ? Sulfa Antibiotics Anaphylaxis  ? Clindamycin Hives and Itching  ? Fentanyl Hives  and Other (See Comments)  ?  Fentanyl patch, it breaks pt out in hives and lowers her blood pressure. ?  ? Naproxen Hives  ? Iodinated Contrast Media Hives  ? Lyrica [Pregabalin]   ?  Chest pain and very irritable and "felt out of this world"  ? Tetracycline Hives and Itching  ? ? ?Medications: I have reviewed the patient's current medications. ?Prior to Admission:  ?No medications prior to admission.  ? ? ?ROS: Constitutional: No fever or chills ?Vision: No changes in vision ?ENT: No difficulty swallowing ?CV: No chest pain ?Pulm: No SOB or wheezing ?GI: No nausea or vomiting ?GU: No urgency or inability to hold urine ?Skin: No poor wound healing ?Neurologic: No numbness or tingling ?Psychiatric: No depression or anxiety ?Heme: No bruising ?Allergic: No reaction to medications or food ? ? ?Exam: ?There were no vitals taken for this visit. ?General: No acute distress ?Orientation: Alert and oriented x4 ?Mood and Affect: Mood and affect appropriate, pleasant and cooperative ?Gait: Ambulates with a relatively smooth steady gait with no assistive device ?Coordination and balance: Within normal limits ? ?RLE: Well-healed surgical incisions both medially and laterally.  Tenderness over the lateral malleolus directly over the hardware.  Tolerates ankle dorsiflexion and plantarflexion.  Some discomfort noted with inversion of the ankle.  Endorses sensation throughout extremity.  Is neurovascularly intact ? ?LLE: Skin without lesions. No tenderness to palpation. Full painless ROM, full strength in each muscle group without evidence of instability. Motor and sensory function intact. Neurovascularly intact. ? ? ?Medical Decision Making: ?Data: ?Imaging: AP, lateral, mortise view right ankle shows medial and lateral fixation in place.  No signs of hardware failure. Fractures appear fully healed.  ? ?Labs: No results found for this or any previous visit (from the past 168 hour(s)). ? ? ?Assessment/Plan: ?61 year old female s/p  ORIF R trimalleolar ankle fracture 10/18/2018, presenting with painful hardware. ? ?Patient's fracture has fully healed and she continues to have irritation form the hardware. I would recommend proceeding with hardware removal at this time in an attempt to alleviate her symptoms. Risks and benefits of the procedure were discussed with the patient, she agrees to proceed. Consent will be obtained. We will plan to discharge the patient home post-operatively. She will continue WBAT RLE post-operatively with no ROM restrictions.  ? ? ?Gwinda Passe PA-C ?[facility] ?[phone] (office) ?[email or web address] ? ? ? ?

## 2021-04-06 ENCOUNTER — Encounter (HOSPITAL_COMMUNITY): Payer: Self-pay | Admitting: Student

## 2021-04-06 ENCOUNTER — Other Ambulatory Visit: Payer: Self-pay

## 2021-04-06 NOTE — Progress Notes (Signed)
Anesthesia Chart Review: ?Same day workup ? ?Follows with cardiology at [facility] for history of CAD s/p remote stenting in 2000, ischemic cardiomyopathy, factor V Leiden maintained on Xarelto.  She had an ILR inserted by EP cardiology in June 2020 after event monitor showed episode of NSVT.  Nuclear stress 01/05/2019 showed infarct with no ischemia.  Last echo December 2021 showed depressed EF, stable at 35 to 40%. She was last seen by cardiology 02/17/2020.  Stable at that time.  Recommended continue current medications.  Follow-up was scheduled for April 2023. ? ?Per pharmacy med rec, patient holding Xarelto starting 04/06/2021. ? ?Patient will need day of surgery labs and evaluation. ? ?EKG 03/25/2020: Sinus rhythm.  Rate 77.  Borderline left axis deviation.  Anterior infarct, old.  Nonspecific T abnormalities, lateral leads. ? ?TTE 01/06/2020 (Care Everywhere): ?Left Ventricle: Distal anterior apical, apical and inferoapical  ?akinesis  ?  Left Ventricle: Systolic function is mild to moderately abnormal. EF:  ?35-40%. ? ?Nuclear stress 01/09/2019 (Care Everywhere): ?Impression.  ?1. Pharmacologic stress protocol performed with Lexiscan due to poor exercise tolerance.  ? ?2. Well-preserved left ventricular systolic function, left ventricular ejection fraction 52%. Anteroseptal hypokinesis.  ? ?3. Abnormal Cardiolite SPECT study. Medium size fixed perfusion abnormality in the anterior, septal and apical regions suggestive of prior infarct.  ? ? ?Antionette Poles, PA-C ?[facility] Short Stay [facility]/Anesthesiology ?Phone [phone] ?04/06/2021 11:45 AM ? ?

## 2021-04-06 NOTE — Anesthesia Preprocedure Evaluation (Addendum)
Anesthesia Evaluation  ?Patient identified by MRN, date of birth, ID band ?Patient awake ? ? ? ?Reviewed: ?Allergy & Precautions, NPO status , Patient's Chart, lab work & pertinent test results ? ?History of Anesthesia Complications ?Negative for: history of anesthetic complications ? ?Airway ?Mallampati: II ? ?TM Distance: >3 FB ?Neck ROM: Full ? ? ? Dental ? ?(+) Edentulous Upper, Edentulous Lower, Dental Advisory Given ?  ?Pulmonary ?asthma , COPD, former smoker,  ?  ?Pulmonary exam normal ? ? ? ? ? ? ? Cardiovascular ?+ CAD, + Past MI, + Cardiac Stents and +CHF  ?Normal cardiovascular exam+ dysrhythmias Ventricular Tachycardia  ? ? ?TTE 01/06/2020 (Care Everywhere): ?Left?Ventricle: Distal anterior apical, apical and inferoapical akinesis. Systolic function is mild to moderately abnormal. EF: 35-40%. ?? ?Nuclear stress 01/09/2019 (Care Everywhere): ?Impression.  ?1. Pharmacologic stress protocol performed with Lexiscan due to poor exercise tolerance.  ?2. Well-preserved left ventricular systolic function, left ventricular ejection fraction 52%. Anteroseptal hypokinesis.  ?3. Abnormal Cardiolite SPECT study. Medium size fixed perfusion abnormality in the anterior, septal and apical regions suggestive of prior infarct.  ?  ?Neuro/Psych ?Anxiety Depression CVA (2020), No Residual Symptoms   ? GI/Hepatic ?Neg liver ROS, GERD  ,  ?Endo/Other  ?negative endocrine ROS ? Renal/GU ?negative Renal ROS  ?negative genitourinary ?  ?Musculoskeletal ? ?(+) Arthritis , Fibromyalgia - ? Abdominal ?  ?Peds ? Hematology ?Factor V Leiden on Xarelto   ?Anesthesia Other Findings ? ? Reproductive/Obstetrics ? ?  ? ? ? ? ? ? ? ? ? ? ? ? ? ?  ?  ? ? ? ? ? ?Anesthesia Physical ?Anesthesia Plan ? ?ASA: 3 ? ?Anesthesia Plan: General  ? ?Post-op Pain Management: Tylenol PO (pre-op)* and Toradol IV (intra-op)*  ? ?Induction: Intravenous ? ?PONV Risk Score and Plan: 3 and Ondansetron, Dexamethasone,  Midazolam and Treatment may vary due to age or medical condition ? ?Airway Management Planned: LMA ? ?Additional Equipment: None ? ?Intra-op Plan:  ? ?Post-operative Plan: Extubation in OR ? ?Informed Consent: I have reviewed the patients History and Physical, chart, labs and discussed the procedure including the risks, benefits and alternatives for the proposed anesthesia with the patient or authorized representative who has indicated his/her understanding and acceptance.  ? ? ? ?Dental advisory given ? ?Plan Discussed with:  ? ?Anesthesia Plan Comments: (PAT note by Antionette Poles, PA-C: ?Follows with cardiology at [facility] for history of CAD s/p remote stenting in 2000, ischemic cardiomyopathy, factor V Leiden maintained on Xarelto.  She had an ILR inserted by EP cardiology in June 2020 after event monitor showed episode of NSVT.  Nuclear stress 01/05/2019 showed infarct with no ischemia.  Last echo December 2021 showed depressed EF, stable at 35 to 40%. She was last seen by cardiology 02/17/2020.  Stable at that time.  Recommended continue current medications.  Follow-up was scheduled for April 2023. ? ?Per pharmacy med rec, patient holding Xarelto starting 04/06/2021. ? ?Patient will need day of surgery labs and evaluation. ? ?EKG 03/25/2020: Sinus rhythm.  Rate 77.  Borderline left axis deviation.  Anterior infarct, old.  Nonspecific T abnormalities, lateral leads. ? ?TTE 01/06/2020 (Care Everywhere): ?Left?Ventricle: Distal anterior apical, apical and inferoapical  ?akinesis  ?? ?Left?Ventricle: Systolic function is mild to moderately abnormal. EF:  ?35-40%. ? ?Nuclear stress 01/09/2019 (Care Everywhere): ?Impression.  ?1. Pharmacologic stress protocol performed with Lexiscan due to poor exercise tolerance.  ? ?2. Well-preserved left ventricular systolic function, left ventricular ejection fraction 52%. Anteroseptal hypokinesis.  ? ?  3. Abnormal Cardiolite SPECT study. Medium size fixed perfusion abnormality in the  anterior, septal and apical regions suggestive of prior infarct.  ? ?)  ? ? ? ? ?Anesthesia Quick Evaluation ? ?

## 2021-04-06 NOTE — Progress Notes (Signed)
DUE TO COVID-19 ONLY ONE VISITOR IS ALLOWED TO COME WITH YOU AND STAY IN THE WAITING ROOM ONLY DURING PRE OP AND PROCEDURE DAY OF SURGERY.  ? ?PCP - Dr Gilman Schmidt  ?Cardiologist - Dr Erich Montane II ? ?Chest x-ray - n/a ?EKG - DOS ?Stress Test - 01/09/19 CE ?ECHO - 01/06/20 CE ?Cardiac Cath -  ? ?Loop - Yes, Biotronik. ? ?Sleep Study -  n/a ?CPAP - none ? ?Blood Thinner Instructions:  Last dose of Xarelto was on 04/05/21.   ? ?Aspirin Instructions: Follow your surgeon's instructions on when to stop Aspirin prior to surgery,  If no instructions were given by your surgeon then you will need to call the office for those instructions. ? ?Anesthesia review: Yes ? ?STOP now taking any Aspirin (unless otherwise instructed by your surgeon), Aleve, Naproxen, Ibuprofen, Motrin, Advil, Goody's, BC's, all herbal medications, fish oil, and all vitamins.  ? ?Coronavirus Screening ?Covid test n/a Ambulatory Surgery   ?Do you have any of the following symptoms:  ?Cough yes/no: No ?Fever (>100.31F)  yes/no: No ?Runny nose yes/no: No ?Sore throat yes/no: No ?Difficulty breathing/shortness of breath  yes/no: No ? ?Have you traveled in the last 14 days and where? yes/no: No ? ?Patient verbalized understanding of instructions that were given via phone. ?

## 2021-04-08 ENCOUNTER — Ambulatory Visit (HOSPITAL_COMMUNITY): Payer: Medicare HMO | Admitting: Physician Assistant

## 2021-04-08 ENCOUNTER — Other Ambulatory Visit: Payer: Self-pay

## 2021-04-08 ENCOUNTER — Encounter (HOSPITAL_COMMUNITY)
Admission: RE | Disposition: A | Discharge status: Home / Self Care | Payer: Self-pay | Source: Home / Self Care | Attending: Student

## 2021-04-08 ENCOUNTER — Ambulatory Visit (HOSPITAL_COMMUNITY): Payer: Medicare HMO

## 2021-04-08 ENCOUNTER — Ambulatory Visit (HOSPITAL_COMMUNITY)
Admission: RE | Admit: 2021-04-08 | Discharge: 2021-04-08 | Disposition: A | Discharge status: Home / Self Care | Payer: Medicare HMO | Attending: Student | Admitting: Student

## 2021-04-08 ENCOUNTER — Encounter (HOSPITAL_COMMUNITY): Payer: Self-pay | Admitting: Student

## 2021-04-08 ENCOUNTER — Ambulatory Visit (HOSPITAL_BASED_OUTPATIENT_CLINIC_OR_DEPARTMENT_OTHER): Payer: Medicare HMO | Admitting: Physician Assistant

## 2021-04-08 DIAGNOSIS — S82891A Other fracture of right lower leg, initial encounter for closed fracture: Secondary | ICD-10-CM | POA: Diagnosis not present

## 2021-04-08 DIAGNOSIS — W19XXXA Unspecified fall, initial encounter: Secondary | ICD-10-CM | POA: Diagnosis not present

## 2021-04-08 DIAGNOSIS — X58XXXA Exposure to other specified factors, initial encounter: Secondary | ICD-10-CM | POA: Diagnosis not present

## 2021-04-08 DIAGNOSIS — I509 Heart failure, unspecified: Secondary | ICD-10-CM | POA: Diagnosis not present

## 2021-04-08 DIAGNOSIS — F17210 Nicotine dependence, cigarettes, uncomplicated: Secondary | ICD-10-CM | POA: Diagnosis not present

## 2021-04-08 DIAGNOSIS — I2510 Atherosclerotic heart disease of native coronary artery without angina pectoris: Secondary | ICD-10-CM | POA: Insufficient documentation

## 2021-04-08 DIAGNOSIS — D6851 Activated protein C resistance: Secondary | ICD-10-CM | POA: Insufficient documentation

## 2021-04-08 DIAGNOSIS — I252 Old myocardial infarction: Secondary | ICD-10-CM | POA: Diagnosis not present

## 2021-04-08 DIAGNOSIS — Z7901 Long term (current) use of anticoagulants: Secondary | ICD-10-CM | POA: Diagnosis not present

## 2021-04-08 DIAGNOSIS — S82851A Displaced trimalleolar fracture of right lower leg, initial encounter for closed fracture: Secondary | ICD-10-CM | POA: Diagnosis not present

## 2021-04-08 DIAGNOSIS — J449 Chronic obstructive pulmonary disease, unspecified: Secondary | ICD-10-CM | POA: Insufficient documentation

## 2021-04-08 DIAGNOSIS — T8484XA Pain due to internal orthopedic prosthetic devices, implants and grafts, initial encounter: Secondary | ICD-10-CM

## 2021-04-08 DIAGNOSIS — Z86718 Personal history of other venous thrombosis and embolism: Secondary | ICD-10-CM | POA: Diagnosis not present

## 2021-04-08 HISTORY — DX: Presence of automatic (implantable) cardiac defibrillator: Z95.810

## 2021-04-08 HISTORY — PX: HARDWARE REMOVAL: SHX979

## 2021-04-08 LAB — BASIC METABOLIC PANEL
Anion gap: 10 (ref 5–15)
BUN: 12 mg/dL (ref 6–20)
CO2: 21 mmol/L — ABNORMAL LOW (ref 22–32)
Calcium: 8.7 mg/dL — ABNORMAL LOW (ref 8.9–10.3)
Chloride: 108 mmol/L (ref 98–111)
Creatinine, Ser: 0.89 mg/dL (ref 0.44–1.00)
GFR, Estimated: >60 mL/min (ref >60)
Glucose, Bld: 104 mg/dL — ABNORMAL HIGH (ref 70–99)
Potassium: 2.9 mmol/L — ABNORMAL LOW (ref 3.5–5.1)
Sodium: 139 mmol/L (ref 135–145)

## 2021-04-08 LAB — CBC
HCT: 45.2 % (ref 36.0–46.0)
Hemoglobin: 15.3 g/dL — ABNORMAL HIGH (ref 12.0–15.0)
MCH: 32.9 pg (ref 26.0–34.0)
MCHC: 33.8 g/dL (ref 30.0–36.0)
MCV: 97.2 fL (ref 80.0–100.0)
Platelets: 365 10*3/uL (ref 150–400)
RBC: 4.65 MIL/uL (ref 3.87–5.11)
RDW: 13.3 % (ref 11.5–15.5)
WBC: 11.7 10*3/uL — ABNORMAL HIGH (ref 4.0–10.5)
nRBC: 0 % (ref 0.0–0.2)

## 2021-04-08 SURGERY — REMOVAL, HARDWARE
Anesthesia: General | Site: Ankle | Laterality: Right

## 2021-04-08 MED ORDER — ONDANSETRON HCL 4 MG/2ML IJ SOLN: 4.0000 mg | Freq: Once | INTRAMUSCULAR | Status: DC | PRN

## 2021-04-08 MED ORDER — KETOROLAC TROMETHAMINE 30 MG/ML IJ SOLN
INTRAMUSCULAR | Status: DC | PRN
  Administered 2021-04-08: 15 mg via INTRAVENOUS

## 2021-04-08 MED ORDER — AMISULPRIDE (ANTIEMETIC) 5 MG/2ML IV SOLN: 10.0000 mg | Freq: Once | INTRAVENOUS | Status: DC | PRN

## 2021-04-08 MED ORDER — FENTANYL CITRATE (PF) 250 MCG/5ML IJ SOLN
INTRAMUSCULAR | Status: DC | PRN
  Administered 2021-04-08: 50 ug via INTRAVENOUS
  Administered 2021-04-08: 25 ug via INTRAVENOUS

## 2021-04-08 MED ORDER — OXYCODONE HCL 5 MG PO TABS: 5.0000 mg | ORAL_TABLET | Freq: Once | ORAL | Status: DC | PRN

## 2021-04-08 MED ORDER — LACTATED RINGERS IV SOLN: INTRAVENOUS | Status: DC

## 2021-04-08 MED ORDER — HYDROMORPHONE HCL 1 MG/ML IJ SOLN
INTRAMUSCULAR | Status: AC
  Filled 2021-04-08: qty 1

## 2021-04-08 MED ORDER — VANCOMYCIN HCL 1000 MG IV SOLR
INTRAVENOUS | Status: DC | PRN
  Administered 2021-04-08: 1000 mg via TOPICAL

## 2021-04-08 MED ORDER — PROPOFOL 10 MG/ML IV BOLUS
INTRAVENOUS | Status: AC
  Filled 2021-04-08: qty 20

## 2021-04-08 MED ORDER — HYDROMORPHONE HCL 1 MG/ML IJ SOLN
0.2500 mg | INTRAMUSCULAR | Status: DC | PRN
  Administered 2021-04-08: 0.5 mg via INTRAVENOUS

## 2021-04-08 MED ORDER — FENTANYL CITRATE (PF) 250 MCG/5ML IJ SOLN
INTRAMUSCULAR | Status: AC
  Filled 2021-04-08: qty 5

## 2021-04-08 MED ORDER — ORAL CARE MOUTH RINSE: 15.0000 mL | Freq: Once | OROMUCOSAL | Status: AC

## 2021-04-08 MED ORDER — LIDOCAINE 2% (20 MG/ML) 5 ML SYRINGE
INTRAMUSCULAR | Status: DC | PRN
  Administered 2021-04-08: 60 mg via INTRAVENOUS

## 2021-04-08 MED ORDER — ACETAMINOPHEN 500 MG PO TABS
1000.0000 mg | ORAL_TABLET | Freq: Once | ORAL | Status: AC
  Administered 2021-04-08: 1000 mg via ORAL
  Filled 2021-04-08: qty 2

## 2021-04-08 MED ORDER — VANCOMYCIN HCL IN DEXTROSE 1-5 GM/200ML-% IV SOLN
1000.0000 mg | INTRAVENOUS | Status: AC
  Administered 2021-04-08: 1000 mg via INTRAVENOUS
  Filled 2021-04-08: qty 200

## 2021-04-08 MED ORDER — ONDANSETRON HCL 4 MG/2ML IJ SOLN
INTRAMUSCULAR | Status: DC | PRN
Start: 2021-04-08 — End: 2021-04-08
  Administered 2021-04-08: 4 mg via INTRAVENOUS

## 2021-04-08 MED ORDER — PROPOFOL 10 MG/ML IV BOLUS
INTRAVENOUS | Status: DC | PRN
Start: 2021-04-08 — End: 2021-04-08
  Administered 2021-04-08: 90 mg via INTRAVENOUS

## 2021-04-08 MED ORDER — DIPHENHYDRAMINE HCL 50 MG/ML IJ SOLN
INTRAMUSCULAR | Status: DC | PRN
  Administered 2021-04-08: 12.5 mg via INTRAVENOUS

## 2021-04-08 MED ORDER — PHENYLEPHRINE 40 MCG/ML (10ML) SYRINGE FOR IV PUSH (FOR BLOOD PRESSURE SUPPORT)
PREFILLED_SYRINGE | INTRAVENOUS | Status: DC | PRN
  Administered 2021-04-08: 80 ug via INTRAVENOUS

## 2021-04-08 MED ORDER — HYDROCODONE-ACETAMINOPHEN 5-325 MG PO TABS
1.0000 | ORAL_TABLET | Freq: Four times a day (QID) | ORAL | 0 refills | Status: DC | PRN
Start: 2021-04-08 — End: 2021-08-13

## 2021-04-08 MED ORDER — OXYCODONE HCL 5 MG/5ML PO SOLN: 5.0000 mg | Freq: Once | ORAL | Status: DC | PRN

## 2021-04-08 MED ORDER — POTASSIUM CHLORIDE 10 MEQ/100ML IV SOLN
10.0000 meq | INTRAVENOUS | Status: AC
  Administered 2021-04-08 (×2): 10 meq via INTRAVENOUS
  Filled 2021-04-08: qty 100

## 2021-04-08 MED ORDER — CHLORHEXIDINE GLUCONATE 0.12 % MT SOLN
15.0000 mL | Freq: Once | OROMUCOSAL | Status: AC
  Administered 2021-04-08: 15 mL via OROMUCOSAL
  Filled 2021-04-08: qty 15

## 2021-04-08 MED ORDER — VANCOMYCIN HCL 1000 MG IV SOLR
INTRAVENOUS | Status: AC
  Filled 2021-04-08: qty 20

## 2021-04-08 MED ORDER — 0.9 % SODIUM CHLORIDE (POUR BTL) OPTIME
TOPICAL | Status: DC | PRN
  Administered 2021-04-08: 1000 mL

## 2021-04-08 SURGICAL SUPPLY — 63 items
BAG COUNTER SPONGE SURGICOUNT (BAG) ×2 IMPLANT
BANDAGE ESMARK 6X9 LF (GAUZE/BANDAGES/DRESSINGS) ×1 IMPLANT
BNDG COHESIVE 6X5 TAN STRL LF (GAUZE/BANDAGES/DRESSINGS) ×1 IMPLANT
BNDG ELASTIC 4X5.8 VLCR STR LF (GAUZE/BANDAGES/DRESSINGS) ×2 IMPLANT
BNDG ELASTIC 6X5.8 VLCR STR LF (GAUZE/BANDAGES/DRESSINGS) ×1 IMPLANT
BNDG ESMARK 6X9 LF (GAUZE/BANDAGES/DRESSINGS) ×2
BNDG GAUZE ELAST 4 BULKY (GAUZE/BANDAGES/DRESSINGS) ×2 IMPLANT
BRUSH SCRUB EZ PLAIN DRY (MISCELLANEOUS) ×4 IMPLANT
CHLORAPREP W/TINT 26 (MISCELLANEOUS) ×2 IMPLANT
CNTNR URN SCR LID CUP LEK RST (MISCELLANEOUS) IMPLANT
CONT SPEC 4OZ STRL OR WHT (MISCELLANEOUS) ×1
COVER SURGICAL LIGHT HANDLE (MISCELLANEOUS) ×3 IMPLANT
CUFF TOURN SGL QUICK 18X4 (TOURNIQUET CUFF) IMPLANT
CUFF TOURN SGL QUICK 24 (TOURNIQUET CUFF)
CUFF TOURN SGL QUICK 34 (TOURNIQUET CUFF)
CUFF TRNQT CYL 24X4X16.5-23 (TOURNIQUET CUFF) IMPLANT
CUFF TRNQT CYL 34X4.125X (TOURNIQUET CUFF) IMPLANT
DRAPE C-ARM 42X72 X-RAY (DRAPES) ×1 IMPLANT
DRAPE C-ARMOR (DRAPES) ×2 IMPLANT
DRAPE U-SHAPE 47X51 STRL (DRAPES) ×2 IMPLANT
DRSG ADAPTIC 3X8 NADH LF (GAUZE/BANDAGES/DRESSINGS) ×1 IMPLANT
DRSG MEPITEL 4X7.2 (GAUZE/BANDAGES/DRESSINGS) ×1 IMPLANT
DRSG PAD ABDOMINAL 8X10 ST (GAUZE/BANDAGES/DRESSINGS) ×1 IMPLANT
ELECT REM PT RETURN 9FT ADLT (ELECTROSURGICAL) ×2
ELECTRODE REM PT RTRN 9FT ADLT (ELECTROSURGICAL) ×1 IMPLANT
GAUZE SPONGE 4X4 12PLY STRL (GAUZE/BANDAGES/DRESSINGS) ×2 IMPLANT
GLOVE SURG ENC MOIS LTX SZ6.5 (GLOVE) ×6 IMPLANT
GLOVE SURG ENC MOIS LTX SZ7.5 (GLOVE) ×8 IMPLANT
GLOVE SURG UNDER POLY LF SZ6.5 (GLOVE) ×2 IMPLANT
GLOVE SURG UNDER POLY LF SZ7.5 (GLOVE) ×2 IMPLANT
GOWN STRL REUS W/ TWL LRG LVL3 (GOWN DISPOSABLE) ×2 IMPLANT
GOWN STRL REUS W/TWL LRG LVL3 (GOWN DISPOSABLE) ×2
KIT BASIN OR (CUSTOM PROCEDURE TRAY) ×2 IMPLANT
KIT TURNOVER KIT B (KITS) ×2 IMPLANT
MANIFOLD NEPTUNE II (INSTRUMENTS) ×2 IMPLANT
NEEDLE 22X1 1/2 (OR ONLY) (NEEDLE) IMPLANT
NS IRRIG 1000ML POUR BTL (IV SOLUTION) ×2 IMPLANT
PACK ORTHO EXTREMITY (CUSTOM PROCEDURE TRAY) ×2 IMPLANT
PAD ARMBOARD 7.5X6 YLW CONV (MISCELLANEOUS) ×4 IMPLANT
PAD CAST 4YDX4 CTTN HI CHSV (CAST SUPPLIES) IMPLANT
PADDING CAST COTTON 4X4 STRL (CAST SUPPLIES) ×1
PADDING CAST COTTON 6X4 STRL (CAST SUPPLIES) ×3 IMPLANT
SPONGE T-LAP 18X18 ~~LOC~~+RFID (SPONGE) ×1 IMPLANT
STAPLER VISISTAT 35W (STAPLE) IMPLANT
STOCKINETTE IMPERVIOUS LG (DRAPES) ×1 IMPLANT
STRIP CLOSURE SKIN 1/2X4 (GAUZE/BANDAGES/DRESSINGS) IMPLANT
SUCTION FRAZIER HANDLE 10FR (MISCELLANEOUS)
SUCTION TUBE FRAZIER 10FR DISP (MISCELLANEOUS) IMPLANT
SUT ETHILON 3 0 PS 1 (SUTURE) ×2 IMPLANT
SUT MNCRL AB 3-0 PS2 18 (SUTURE) ×1 IMPLANT
SUT MON AB 2-0 CT1 36 (SUTURE) ×1 IMPLANT
SUT PDS AB 2-0 CT1 27 (SUTURE) IMPLANT
SUT VIC AB 0 CT1 27 (SUTURE)
SUT VIC AB 0 CT1 27XBRD ANBCTR (SUTURE) IMPLANT
SUT VIC AB 2-0 CT1 27 (SUTURE) ×1
SUT VIC AB 2-0 CT1 TAPERPNT 27 (SUTURE) IMPLANT
SYR CONTROL 10ML LL (SYRINGE) IMPLANT
TOWEL GREEN STERILE (TOWEL DISPOSABLE) ×4 IMPLANT
TOWEL GREEN STERILE FF (TOWEL DISPOSABLE) ×4 IMPLANT
TUBE CONNECTING 12X1/4 (SUCTIONS) ×2 IMPLANT
UNDERPAD 30X36 HEAVY ABSORB (UNDERPADS AND DIAPERS) ×2 IMPLANT
WATER STERILE IRR 1000ML POUR (IV SOLUTION) ×2 IMPLANT
YANKAUER SUCT BULB TIP NO VENT (SUCTIONS) ×2 IMPLANT

## 2021-04-08 NOTE — Anesthesia Procedure Notes (Signed)
Procedure Name: LMA Insertion ?Date/Time: 04/08/2021 7:46 AM ?Performed by: Kara Mead, CRNA ?Pre-anesthesia Checklist: Patient identified, Emergency Drugs available, Suction available, Patient being monitored and Timeout performed ?Patient Re-evaluated:Patient Re-evaluated prior to induction ?Oxygen Delivery Method: Circle system utilized ?Preoxygenation: Pre-oxygenation with 100% oxygen ?Induction Type: IV induction ?LMA: LMA inserted ?LMA Size: 4.0 ?Number of attempts: 1 ?Tube secured with: Tape ?Dental Injury: Teeth and Oropharynx as per pre-operative assessment  ? ? ? ? ?

## 2021-04-08 NOTE — Anesthesia Postprocedure Evaluation (Signed)
Anesthesia Post Note ? ?Patient: Jentrie Misek ? ?Procedure(s) Performed: REMOVAL OF HARDWARE RIGHT ANKLE (Right: Ankle) ? ?  ? ?Patient location during evaluation: PACU ?Anesthesia Type: General ?Level of consciousness: awake and alert ?Pain management: pain level controlled ?Vital Signs Assessment: post-procedure vital signs reviewed and stable ?Respiratory status: spontaneous breathing, nonlabored ventilation and respiratory function stable ?Cardiovascular status: blood pressure returned to baseline and stable ?Postop Assessment: no apparent nausea or vomiting ?Anesthetic complications: no ? ? ?No notable events documented. ? ?Last Vitals:  ?Vitals:  ? 04/08/21 0913 04/08/21 0919  ?BP: (!) 86/64 94/67  ?Pulse: (!) 57 (!) 56  ?Resp: 11 12  ?Temp:    ?SpO2: 97% 98%  ?  ?Last Pain:  ?Vitals:  ? 04/08/21 0913  ?TempSrc:   ?PainSc: 4   ? ? ?  ?  ?  ?  ?  ?  ? ?Lidia Collum ? ? ? ? ?

## 2021-04-08 NOTE — Interval H&P Note (Signed)
History and Physical Interval Note: ? ?04/08/2021 ?7:34 AM ? ?Lindsay Bradford  has presented today for surgery, with the diagnosis of Painful orthopaedic hardware.  The various methods of treatment have been discussed with the patient and family. After consideration of risks, benefits and other options for treatment, the patient has consented to  Procedure(s): ?HARDWARE REMOVAL ANKLE (Right) as a surgical intervention.  The patient's history has been reviewed, patient examined, no change in status, stable for surgery.  I have reviewed the patient's chart and labs.  Questions were answered to the patient's satisfaction.   ? ? ?Caryn Bee P Odessia Asleson ? ? ?

## 2021-04-08 NOTE — Discharge Instructions (Addendum)
? ?Orthopaedic Trauma Service Discharge Instructions ? ? ?General Discharge Instructions ? ?WEIGHT BEARING STATUS:Weightbearing as tolerated ? ?RANGE OF MOTION/ACTIVITY: ok for ankle range of motion as tolerated ? ?Wound Care:you may remove your surgical dressing on post-op day #2 (Sunday 04/10/21).  Incisions can be left open to air if there is no drainage. If incision continues to have drainage, follow wound care instructions below. Okay to shower if no drainage from incisions. ? ?DVT/PE prophylaxis: Continue home dose Aspirin 81 mg ? ?Diet: as you were eating previously.  Can use over the counter stool softeners and bowel preparations, such as Miralax, to help with bowel movements.  Narcotics can be constipating.  Be sure to drink plenty of fluids ? ?PAIN MEDICATION USE AND EXPECTATIONS ? You have likely been given narcotic medications to help control your pain.  After a traumatic event that results in an fracture (broken bone) with or without surgery, it is ok to use narcotic pain medications to help control one's pain.  We understand that everyone responds to pain differently and each individual patient will be evaluated on a regular basis for the continued need for narcotic medications. Ideally, narcotic medication use should last no more than 6-8 weeks (coinciding with fracture healing).  ? As a patient it is your responsibility as well to monitor narcotic medication use and report the amount and frequency you use these medications when you come to your office visit.  ? We would also advise that if you are using narcotic medications, you should take a dose prior to therapy to maximize you participation. ? ?IF YOU ARE ON NARCOTIC MEDICATIONS IT IS NOT PERMISSIBLE TO OPERATE A MOTOR VEHICLE (MOTORCYCLE/CAR/TRUCK/MOPED) OR HEAVY MACHINERY ?DO NOT MIX NARCOTICS WITH OTHER CNS (CENTRAL NERVOUS SYSTEM) DEPRESSANTS SUCH AS ALCOHOL ? ? ?STOP SMOKING OR USING NICOTINE PRODUCTS!!!! ? As discussed nicotine severely  impairs your body's ability to heal surgical and traumatic wounds but also impairs bone healing.  Wounds and bone heal by forming microscopic blood vessels (angiogenesis) and nicotine is a vasoconstrictor (essentially, shrinks blood vessels).  Therefore, if vasoconstriction occurs to these microscopic blood vessels they essentially disappear and are unable to deliver necessary nutrients to the healing tissue.  This is one modifiable factor that you can do to dramatically increase your chances of healing your injury.   ? (This means no smoking, no nicotine gum, patches, etc) ?   ? ?ICE AND ELEVATE INJURED/OPERATIVE EXTREMITY ? Using ice and elevating the injured extremity above your heart can help with swelling and pain control.  Icing in a pulsatile fashion, such as 20 minutes on and 20 minutes off, can be followed.   ? Do not place ice directly on skin. Make sure there is a barrier between to skin and the ice pack.   ? Using frozen items such as frozen peas works well as the conform nicely to the are that needs to be iced. ? ?USE AN ACE WRAP OR TED HOSE FOR SWELLING CONTROL ? In addition to icing and elevation, Ace wraps or TED hose are used to help limit and resolve swelling.  It is recommended to use Ace wraps or TED hose until you are informed to stop.   ? When using Ace Wraps start the wrapping distally (farthest away from the body) and wrap proximally (closer to the body) ?  Example: If you had surgery on your leg or thing and you do not have a splint on, start the ace wrap at the toes and  work your way up to the thigh ?       If you had surgery on your upper extremity and do not have a splint on, start the ace wrap at your fingers and work your way up to the upper arm ? ? ?CALL THE OFFICE WITH ANY QUESTIONS OR CONCERNS: [phone]  ? ?VISIT OUR WEBSITE FOR ADDITIONAL INFORMATION: [email or web address] ?  ? ? ?Discharge Wound Care Instructions ? ?Do NOT apply any ointments, solutions or lotions to pin sites or  surgical wounds.  These prevent needed drainage and even though solutions like hydrogen peroxide kill bacteria, they also damage cells lining the pin sites that help fight infection.  Applying lotions or ointments can keep the wounds moist and can cause them to breakdown and open up as well. This can increase the risk for infection. When in doubt call the office. ? ?If any drainage is noted, use one layer of adaptic or Mepitel, then gauze, Kerlix, and an ace wrap. ?- These dressing supplies should be available at local medical supply stores [facility], [facility], etc) as well as Insurance claims handler ([facility], [facility], [employer], etc) ? ?Once the incision is completely dry and without drainage, it may be left open to air out.  Showering may begin 36-48 hours later.  Cleaning gently with soap and water. ?

## 2021-04-08 NOTE — Op Note (Signed)
Orthopaedic Surgery Operative Note (CSN: [id number] ) ?Date of Surgery: 04/08/2021  ?Admit Date: 04/08/2021  ? ?Diagnoses: ?Pre-Op Diagnoses: ?Painful orthopaedic hardware right ankle  ? ?Post-Op Diagnosis: ?Same ? ?Procedures: ?CPT 20680-Removal of hardware right ankle ? ?Surgeons : ?Primary: Shona Needles, MD ? ?Assistant: Patrecia Pace, PA-C ? ?Location: OR 3  ? ?Anesthesia:General  ? ?Antibiotics: Vancomycin IV preop with 1 gm vancomycin powder placed topically ? ?Tourniquet time: None used   ? ?Estimated Blood Loss: 25 mL ? ?Complications: None ? ?Specimens:None  ? ?Implants: ?Implant Name Type Inv. Item Serial No. Manufacturer Lot No. LRB No. Used Action  ?PLATE RECONSTRUCTION - [id number] Plate PLATE LCP RECON 3.5 [id number]  [employer]  Right 1 Explanted  ?SCREW CORTEX 3.5 14MM - [id number] Screw SCREW CORTEX 3.5 14MM  [employer]  Right 3 Explanted  ?SCREW CORTEX 3.5 16MM - [id number] Screw SCREW CORTEX 3.5 16MM  [employer]  Right 1 Explanted  ?SCREW CORTEX 3.5 50MM - [id number] Screw SCREW CORTEX 3.5 50MM  [employer]  Right 1 Explanted  ?SCREW HEADED ST 3.5X80 - [id number] Screw SCREW HEADED ST 3.5X80  [employer]  Right 1 Explanted  ?SCREW LONG THREAD 4.0 - [id number] Screw SCREW CANN L THRD/44 4.0  [employer]  Right 1 Explanted  ?SCREW CORTEX 3.5 60MM - [id number] Screw SCREW CORTEX 3.5 60MM  [employer]  Right 1 Explanted  ?SCREW HEADED ST 3.5X75 - [id number] Screw SCREW HEADED ST 3.5X75  [employer]  Right 1 Explanted  ?  ? ?Indications for Surgery: ?61 year old female who underwent open reduction internal fixation of right trimalleolar ankle fracture in October 2020.  She subsequently healed uneventfully however she continued to have some residual pain in her right ankle.  Due to residual pain I recommended hardware removal.  Risks and benefits were discussed with the patient.  Risks include but not limited to bleeding, infection, persistent pain, nerve or blood vessel injury, DVT, even  the possibility anesthetic complications.  She agreed to proceed with surgery and consent was obtained. ? ?Operative Findings: ?Successful removal of hardware from right ankle without complication ? ?Procedure: ?The patient was identified in the preoperative holding area. Consent was confirmed with the patient and their family and all questions were answered. The operative extremity was marked after confirmation with the patient. she was then brought back to the operating room by our anesthesia colleagues.  She was carefully transferred over to radiolucent flat top table.  She was placed under general anesthetic.  The right lower extremity was then prepped and draped in usual sterile fashion.  A timeout was performed verify the patient, the procedure, and the extremity.  Preoperative antibiotics were dosed. ? ?Fluoroscopic imaging was obtained to show the hardware that was in place.  I reopened the lateral incision and proceeded to remove all the screws and lateral plate.  This was done without difficulty.  There is no signs of infection.  I then percutaneously made incisions along the medial malleolus along the posterior malleolus screws and I was able to remove these without difficulty.  Final fluoroscopic imaging was obtained which showed no retained hardware.  Final fluoroscopic imaging was obtained.  The incisions were copiously irrigated.  A gram of vancomycin powder was placed into the incision.  A layered closure of 2-0 Vicryl 3-0 Monocryl with Steri-Strips was placed.  A sterile dressing was applied.  Patient was awoken from anesthesia and taken to the PACU in stable condition. ? ?Post Op Plan/Instructions: ?Patient will be  weightbearing as tolerated to right lower extremity.  She will not need any DVT prophylaxis.  She will be discharged home from the PACU. ? ?I was present and performed the entire surgery. ? ?Patrecia Pace, PA-C did assist me throughout the case. An assistant was necessary given the  difficulty in approach, maintenance of reduction and ability to instrument the fracture. ? ? ?Katha Hamming, MD ?[facility]  ?

## 2021-04-08 NOTE — Transfer of Care (Signed)
Immediate Anesthesia Transfer of Care Note ? ?Patient: Lindsay Bradford ? ?Procedure(s) Performed: REMOVAL OF HARDWARE RIGHT ANKLE (Right: Ankle) ? ?Patient Location: PACU ? ?Anesthesia Type:General ? ?Level of Consciousness: drowsy ? ?Airway & Oxygen Therapy: Patient Spontanous Breathing and Patient connected to face mask oxygen ? ?Post-op Assessment: Report given to RN and Post -op Vital signs reviewed and stable ? ?Post vital signs: Reviewed and stable ? ?Last Vitals:  ?Vitals Value Taken Time  ?BP 141/114 04/08/21 0842  ?Temp    ?Pulse 63 04/08/21 0843  ?Resp 7 04/08/21 0843  ?SpO2 100 % 04/08/21 0843  ?Vitals shown include unvalidated device data. ? ?Last Pain:  ?Vitals:  ? 04/08/21 0632  ?TempSrc:   ?PainSc: 0-No pain  ?   ? ?  ? ?Complications: No notable events documented. ?

## 2021-04-08 NOTE — Progress Notes (Signed)
K 2.9 in short stay. Dr. Christella Hartigan made aware. ?

## 2021-04-11 ENCOUNTER — Encounter (HOSPITAL_COMMUNITY): Payer: Self-pay | Admitting: Student

## 2021-04-25 ENCOUNTER — Ambulatory Visit: Payer: Medicare HMO

## 2021-05-10 ENCOUNTER — Other Ambulatory Visit: Payer: Self-pay | Admitting: Family

## 2021-05-10 ENCOUNTER — Other Ambulatory Visit: Payer: Self-pay | Admitting: Internal Medicine

## 2021-05-10 DIAGNOSIS — M8580 Other specified disorders of bone density and structure, unspecified site: Secondary | ICD-10-CM

## 2021-05-10 DIAGNOSIS — J42 Unspecified chronic bronchitis: Secondary | ICD-10-CM

## 2021-05-13 ENCOUNTER — Ambulatory Visit (INDEPENDENT_AMBULATORY_CARE_PROVIDER_SITE_OTHER): Payer: Medicare HMO | Admitting: Orthopedic Surgery

## 2021-05-13 ENCOUNTER — Encounter: Payer: Self-pay | Admitting: Orthopedic Surgery

## 2021-05-13 VITALS — Ht 67.0 in | Wt 193.0 lb

## 2021-05-13 DIAGNOSIS — M7062 Trochanteric bursitis, left hip: Secondary | ICD-10-CM

## 2021-05-13 NOTE — Patient Instructions (Signed)
Greater trochanteric bursitis - surgical removal of the bursa ? ? ?Instructions Following Joint Injections ? ?In clinic today, you received an injection in one of your joints (sometimes more than one).  Occasionally, you can have some pain at the injection site, this is normal.  You can place ice at the injection site, or take over-the-counter medications such as Tylenol (acetaminophen) or Advil (ibuprofen).  Please follow all directions listed on the bottle. ? ?If your joint (knee or shoulder) becomes swollen, red or very painful, please contact the clinic for additional assistance.  ? ?Two medications were injected, including lidocaine and a steroid (often referred to as cortisone).  Lidocaine is effective almost immediately but wears off quickly.  However, the steroid can take a few days to improve your symptoms.  In some cases, it can make your pain worse for a couple of days.  Do not be concerned if this happens as it is common.  You can apply ice or take some over-the-counter medications as needed.   ?

## 2021-05-13 NOTE — Progress Notes (Signed)
Orthopaedic Clinic Return ? ?Assessment: ?Lindsay Bradford is a 61 y.o. female with the following: ?Left greater trochanteric bursitis ? ? ?Plan: ?Patient has had tenderness of the lateral hip for years.  She has responded well to injections in the past.  She would like to proceed with another injection in clinic today.  We briefly discussed possibility of proceeding with surgery.  The procedure was discussed, and have recommended that she evaluate this in more detail.  If she is interested in the future, she will contact, follow-up as needed at this time. ? ?Procedure note injection - Left lateral hip ?  ?Verbal consent was obtained to inject the Left lateral hip.  Patient localized the pain. ?Timeout was completed to confirm the site of injection.  The skin was prepped with alcohol and ethyl chloride was sprayed at the injection site.  ?A 21-gauge needle was used to inject 40 mg of Depo-Medrol and 1% lidocaine (4 cc) into the Left lateral hip, directly over the localized tenderness using a direct lateral approach.  ?There were no complications. A sterile bandage was applied. ? ? ?Follow-up: ?Return if symptoms worsen or fail to improve. ? ? ?Subjective: ? ?Chief Complaint  ?Patient presents with  ? Hip Pain  ?  Lt hip, wants injection  ? ? ?History of Present Illness: ?Lindsay Bradford is a 61 y.o. female who returns to clinic for evaluation of left lateral hip pain.  She had pain in the lateral hip for several years.  She has responded well to injections in the past.  Since last injection, the pain has worn off.  She difficulty sleeping at night.  Of note, she has recently been diagnosed with rheumatoid arthritis.  She is scheduled to see a rheumatologist.  She has pain in multiple joints. ? ?Review of Systems: ?No fevers or chills ?No numbness or tingling ?No chest pain ?No shortness of breath ?No bowel or bladder dysfunction ?No GI distress ?No headaches ? ? ?Objective: ?Ht 5\' 7"  (1.702 m)   Wt 193 lb (87.5 kg)    BMI 30.23 kg/m?  ? ?Physical Exam: ? ?Evaluation left hip demonstrates no deformity.  She is tender to palpation directly over the greater trochanter, on the lateral side of the hip.  She tolerates gentle range of motion of the hip.  Pain does not get worse with straight leg raise.  Toes are warm and well-perfused. ? ?IMAGING: ?I personally ordered and reviewed the following images: ? ?No new imaging obtained today. ? ? ?Mordecai Rasmussen, MD ?05/13/2021 ?2:12 PM ? ? ?

## 2021-05-25 ENCOUNTER — Other Ambulatory Visit: Payer: Self-pay | Admitting: Family

## 2021-05-25 DIAGNOSIS — I739 Peripheral vascular disease, unspecified: Secondary | ICD-10-CM

## 2021-05-25 DIAGNOSIS — E785 Hyperlipidemia, unspecified: Secondary | ICD-10-CM

## 2021-05-25 DIAGNOSIS — Z8673 Personal history of transient ischemic attack (TIA), and cerebral infarction without residual deficits: Secondary | ICD-10-CM

## 2021-05-25 DIAGNOSIS — I2510 Atherosclerotic heart disease of native coronary artery without angina pectoris: Secondary | ICD-10-CM

## 2021-06-22 IMAGING — DX DG ANKLE PORT 2V*R*
4 series · 4 of 4 positions shown · non-contrast
Comparison: Right ankle radiographs 10/13/2018

CLINICAL DATA: Post-op right ankle fracture.

EXAM:
PORTABLE RIGHT ANKLE - 2 VIEW

[ankle ap (1 of 2)]
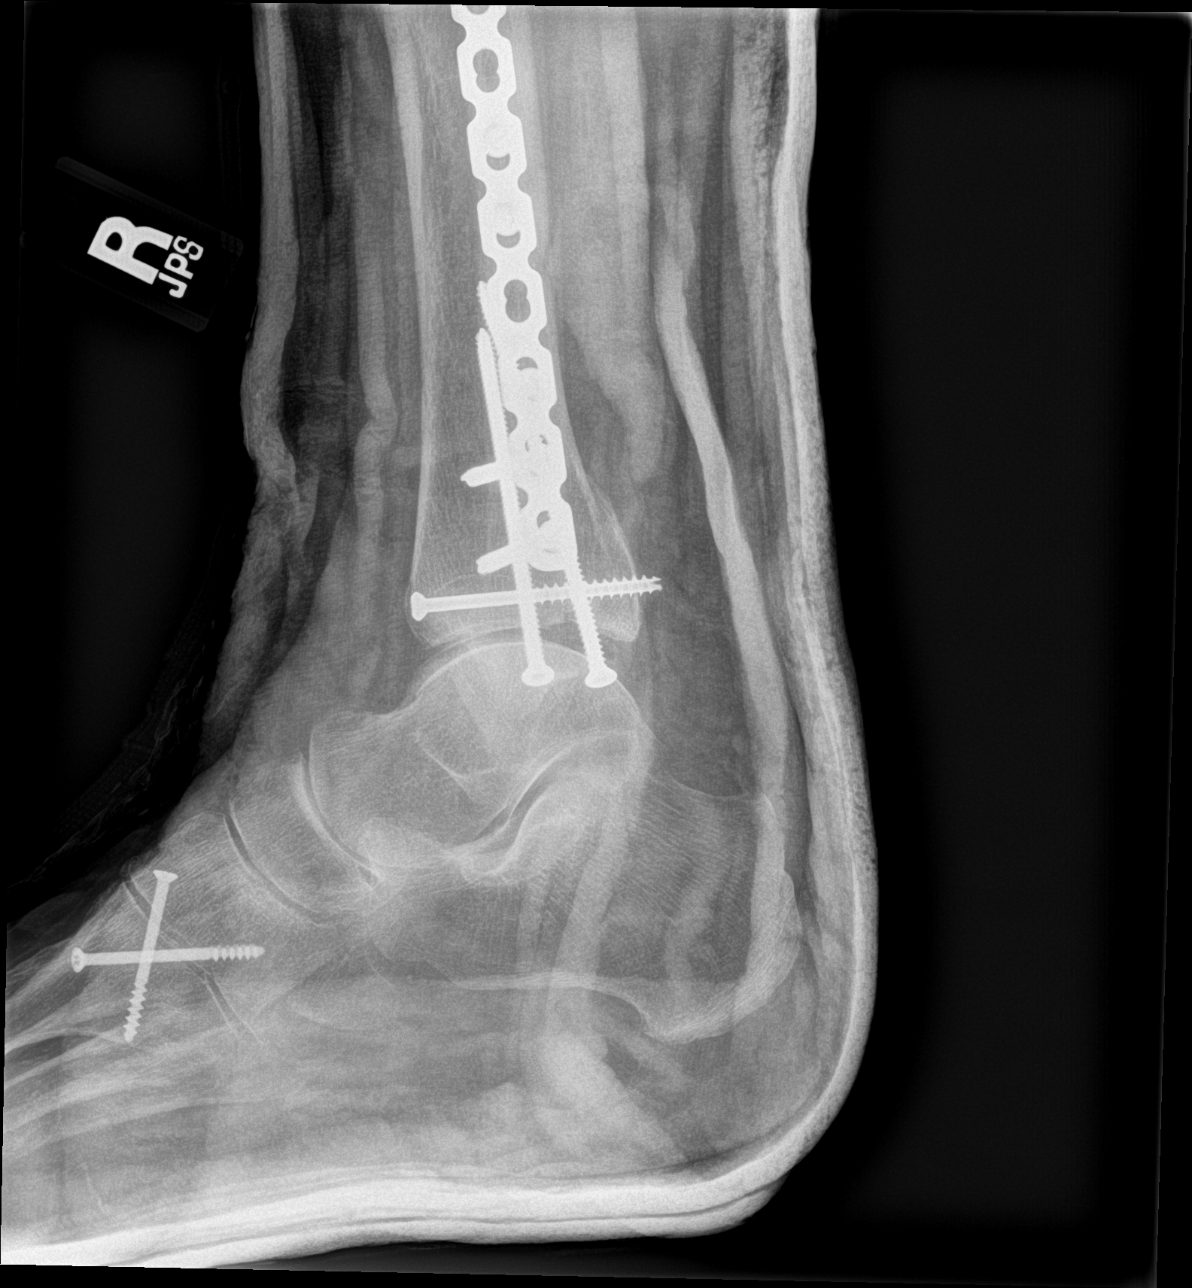

[ankle ap (2 of 2)]
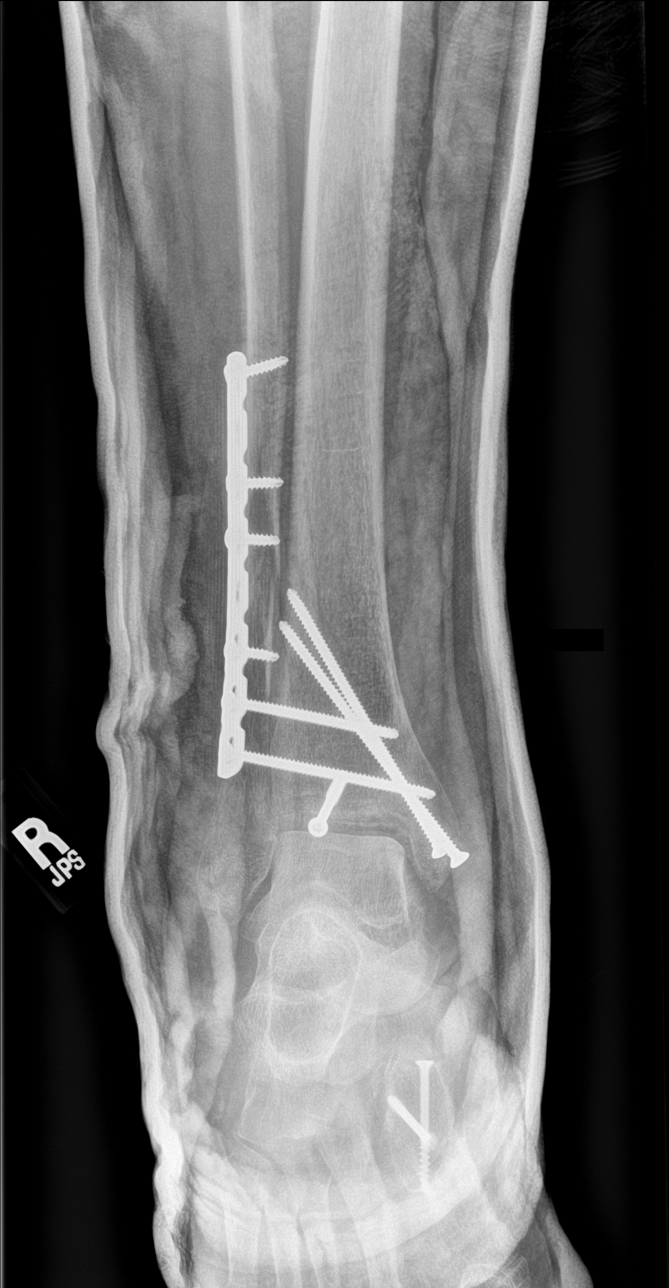

[ankle obl (1 of 2)]
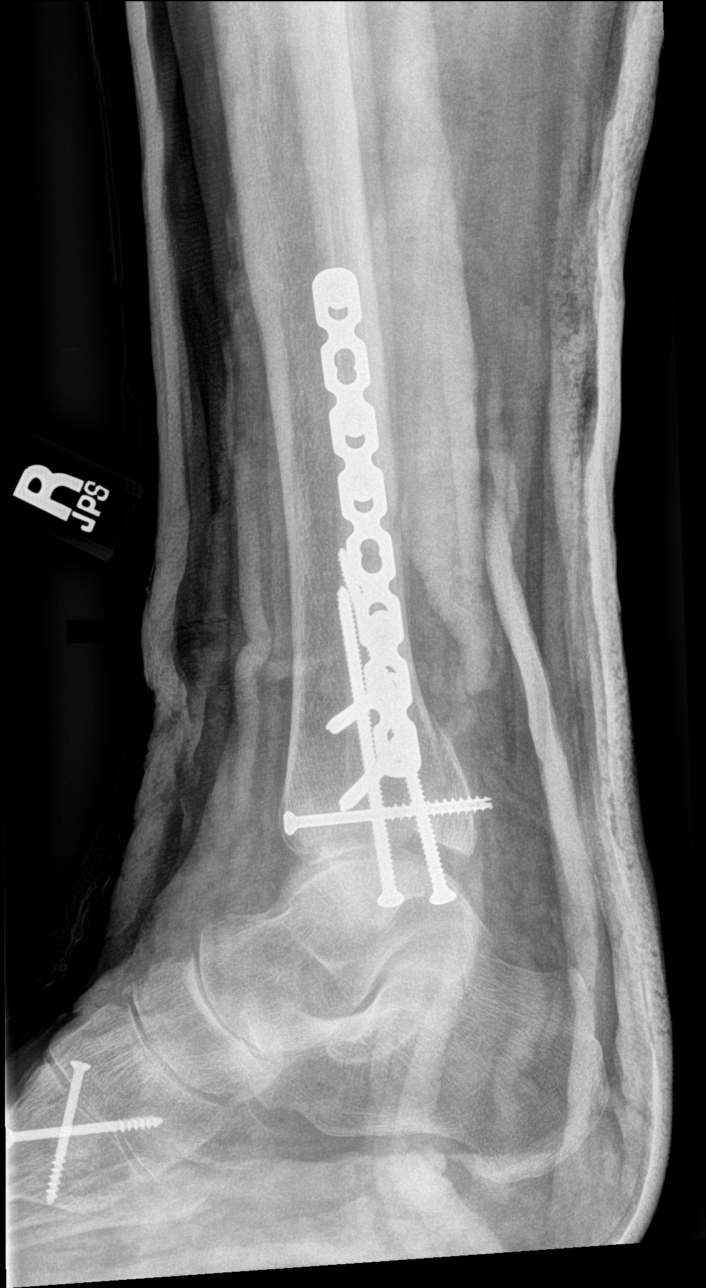

[ankle obl (2 of 2)]
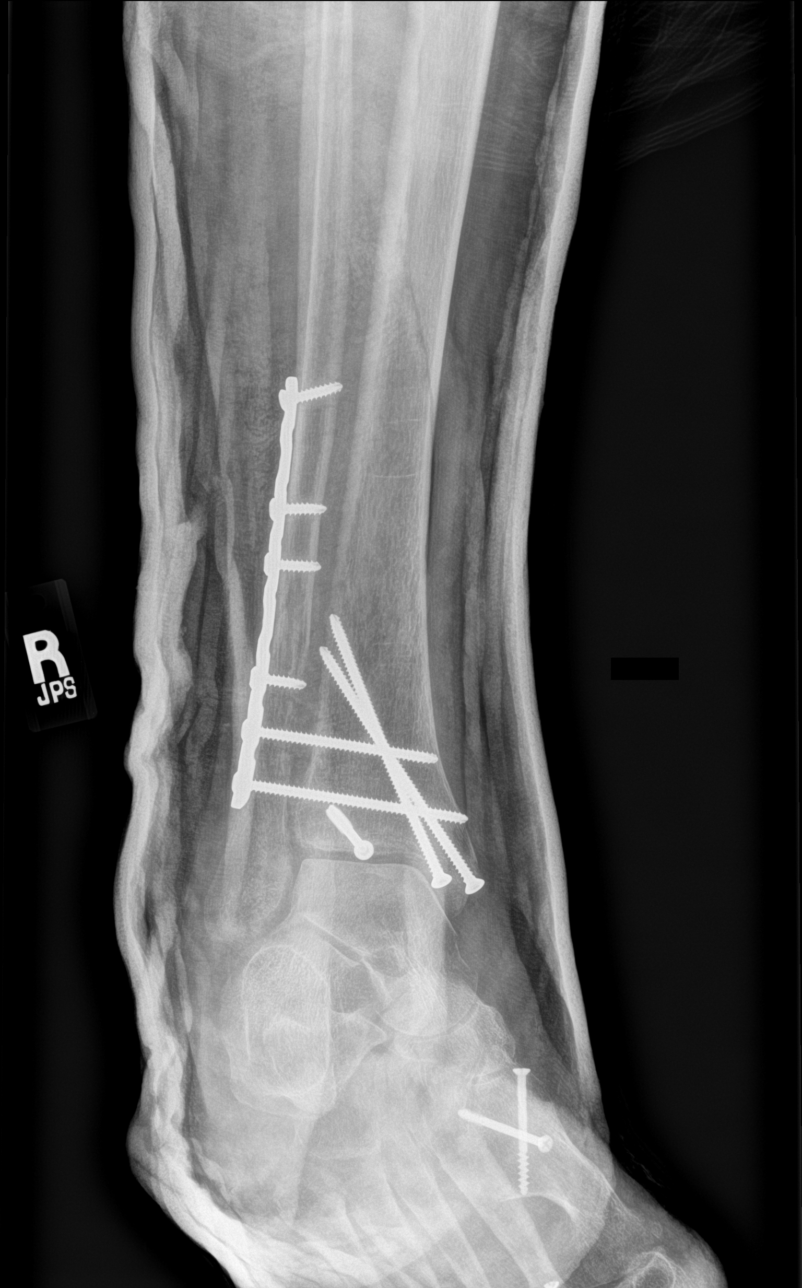

[4 of 4 positions shown; findings below may reference images not displayed]

FINDINGS: Status post ORIF of the ankle for multiple fractures in the distal
tibia and fibula. The hardware appears intact. Near anatomic
alignment. Evaluation of the soft tissues somewhat limited by
overlying splint.

There is a subtle oblique linear lucency in the mid/distal tibia
just above the level of the fibular hardware seen only on oblique
view which is favored to represent artifact from the splint, less
likely an additional fracture.
IMPRESSION: 1. Status post ORIF for multiple fractures of the distal tibia and
fibula.
2. Subtle oblique linear lucency in the mid/distal tibia seen on
only one view, favored to represent artifact from the splint, less
likely an additional fracture.

## 2021-07-21 ENCOUNTER — Other Ambulatory Visit: Payer: Self-pay | Admitting: Family

## 2021-07-24 ENCOUNTER — Encounter (HOSPITAL_COMMUNITY): Payer: Self-pay | Admitting: *Deleted

## 2021-07-24 ENCOUNTER — Other Ambulatory Visit: Payer: Self-pay

## 2021-07-24 ENCOUNTER — Emergency Department (HOSPITAL_COMMUNITY)
Admission: EM | Admit: 2021-07-24 | Discharge: 2021-07-24 | Disposition: A | Discharge status: Home / Self Care | Payer: Medicare Other | Attending: Emergency Medicine | Admitting: Emergency Medicine

## 2021-07-24 DIAGNOSIS — Z7982 Long term (current) use of aspirin: Secondary | ICD-10-CM | POA: Diagnosis not present

## 2021-07-24 DIAGNOSIS — M542 Cervicalgia: Secondary | ICD-10-CM | POA: Diagnosis present

## 2021-07-24 DIAGNOSIS — Z7901 Long term (current) use of anticoagulants: Secondary | ICD-10-CM | POA: Diagnosis not present

## 2021-07-24 HISTORY — DX: Post-traumatic stress disorder, unspecified: F43.10

## 2021-07-24 HISTORY — DX: Rheumatoid arthritis, unspecified: M06.9

## 2021-07-24 MED ORDER — CYCLOBENZAPRINE HCL 10 MG PO TABS: 10.0000 mg | ORAL_TABLET | Freq: Two times a day (BID) | ORAL | 0 refills | Status: AC | PRN

## 2021-07-24 MED ORDER — LIDOCAINE 5 % EX PTCH
1.0000 | MEDICATED_PATCH | CUTANEOUS | Status: DC
  Administered 2021-07-24: 1 via TRANSDERMAL
  Filled 2021-07-24: qty 1

## 2021-07-24 MED ORDER — DIAZEPAM 5 MG PO TABS
5.0000 mg | ORAL_TABLET | Freq: Once | ORAL | Status: AC
  Administered 2021-07-24: 5 mg via ORAL
  Filled 2021-07-24: qty 1

## 2021-07-24 MED ORDER — LIDOCAINE 5 % EX PTCH
1.0000 | MEDICATED_PATCH | CUTANEOUS | 0 refills | Status: DC
Start: 2021-07-24 — End: 2021-08-13

## 2021-07-24 NOTE — Discharge Instructions (Signed)
Please follow-up with your primary care doctor for further evaluation.  You can take Flexeril twice daily for muscle spasms.  Do not mix with alcohol.  You can use lidocaine patches as well.  If they are too expensive with the prescription you can always get the over-the-counter generic version which is typically cheaper.  Please return to the emergency department for any worsening symptoms.

## 2021-07-24 NOTE — ED Triage Notes (Signed)
Pt with right neck x 3-4 days.  Denies any injury.

## 2021-07-24 NOTE — ED Provider Notes (Signed)
[facility] EMERGENCY DEPARTMENT Provider Note   CSN: [id number] Arrival date & time: 07/24/21  1904     History Chief Complaint  Patient presents with   Neck Pain    Lindsay Bradford is a 61 y.o. female patient presents to the emergency department with right-sided neck pain has been present for 3 to 4 days.  Patient states she woke up like this.  She is having trouble looking to the right which increases pain.  Patient denies any fever, chills, focal weakness to the upper extremities.  No trouble breathing.   Neck Pain      Home Medications Prior to Admission medications   Medication Sig Start Date End Date Taking? Authorizing Provider  cyclobenzaprine (FLEXERIL) 10 MG tablet Take 1 tablet (10 mg total) by mouth 2 (two) times daily as needed for muscle spasms. 07/24/21  Yes Hassaan Crite M, PA-C  lidocaine (LIDODERM) 5 % Place 1 patch onto the skin daily. Remove & Discard patch within 12 hours or as directed by MD 07/24/21  Yes Raul Del, Nery Kalisz M, PA-C  acetaminophen (TYLENOL) 650 MG CR tablet Take 1,300 mg by mouth every 4 (four) hours.    [provider]  albuterol (VENTOLIN HFA) 108 (90 Base) MCG/ACT inhaler Inhale 1-2 puffs into the lungs every 6 (six) hours as needed for wheezing or shortness of breath. 04/26/20   Evelina Dun A, FNP  aspirin EC 81 MG EC tablet Take 1 tablet (81 mg total) by mouth daily. Patient taking differently: Take 81 mg by mouth daily. Takes after 9 am daily 12/18/18   Clapacs, Madie Reno, MD  busPIRone (BUSPAR) 7.5 MG tablet TAKE 1 TABLET TWICE DAILY 01/06/21   Evelina Dun A, FNP  Cholecalciferol (VITAMIN D3) 50 MCG (2000 UT) TABS Take 2,000 Units by mouth daily.    [provider]  diclofenac Sodium (VOLTAREN) 1 % GEL Apply 1 application. topically 4 (four) times daily as needed (left hand pain.).    [provider]  HYDROcodone-acetaminophen (NORCO/VICODIN) 5-325 MG tablet Take 1 tablet by mouth every 6 (six) hours as needed for  severe pain. 04/08/21   Corinne Ports, PA-C  mineral oil-hydrophilic petrolatum (AQUAPHOR) ointment Apply 1 application. topically as needed (severe dry skin on left elbow).    [provider]  nitroGLYCERIN (NITROSTAT) 0.4 MG SL tablet Place 1 tablet (0.4 mg total) under the tongue every 5 (five) minutes as needed for chest pain. 12/17/18   Clapacs, Madie Reno, MD  Omega-3 Fatty Acids (FISH OIL) 1000 MG CAPS Take 1,000 mg by mouth daily.    [provider]  pantoprazole (PROTONIX) 40 MG tablet Take 1 tablet (40 mg total) by mouth daily. 09/15/20   Sharion Balloon, FNP  predniSONE (STERAPRED UNI-PAK 21 TAB) 10 MG (21) TBPK tablet 10 mg DS 12 as directed 04/01/21   Mordecai Rasmussen, MD  rivaroxaban (XARELTO) 20 MG TABS tablet Take 1 tablet (20 mg total) by mouth daily. 12/18/18   Clapacs, Madie Reno, MD  rosuvastatin (CRESTOR) 40 MG tablet TAKE 1 TABLET EVERY DAY 11/29/20   Evelina Dun A, FNP  topiramate (TOPAMAX) 100 MG tablet TAKE 1 TABLET EVERY DAY 11/29/20   Evelina Dun A, FNP  traZODone (DESYREL) 150 MG tablet USE FROM 1/3 TO 1 TABLET NIGHTLY AS NEEDED FOR SLEEP 10/28/20   Evelina Dun A, FNP  valACYclovir (VALTREX) 500 MG tablet TAKE 1 TABLET EVERY DAY 09/01/20   Evelina Dun A, FNP  venlafaxine XR (EFFEXOR-XR) 150 MG 24  hr capsule TAKE 1 CAPSULE EVERY DAY WITH BREAKFAST 11/29/20   Jannifer Rodney A, FNP  vitamin E 180 MG (400 UNITS) capsule Take 400 Units by mouth daily.    [provider]      Allergies    Cephalexin, Clarithromycin, Ibuprofen, Other, Oxymorphone, Penicillins, Potassium chloride, Sulfa antibiotics, Clindamycin, Fentanyl, Naproxen, Iodinated contrast media, Lyrica [pregabalin], and Tetracycline    Review of Systems   Review of Systems  Musculoskeletal:  Positive for neck pain.  All other systems reviewed and are negative.   Physical Exam Updated Vital Signs BP 98/80 (BP Location: Left Arm)   Pulse 97   Temp 98.2 F (36.8 C) (Oral)   Resp 16    Ht 5\' 7"  (1.702 m)   Wt 89.4 kg   SpO2 93%   BMI 30.85 kg/m  Physical Exam Vitals and nursing note reviewed.  Constitutional:      Appearance: Normal appearance.  HENT:     Head: Normocephalic and atraumatic.  Eyes:     General:        Right eye: No discharge.        Left eye: No discharge.     Conjunctiva/sclera: Conjunctivae normal.  Neck:     Comments: Palpable spasm over the right para cervical musculature and trapezius.  Patient has decreased range of motion when looking to the right.  No meningismus. Pulmonary:     Effort: Pulmonary effort is normal.  Skin:    General: Skin is warm and dry.     Findings: No rash.  Neurological:     General: No focal deficit present.     Mental Status: She is alert.     Sensory: Sensation is intact.     Motor: Motor function is intact.     Comments: 5/5 strength to the upper extremities.  Normal sensation to the upper extremities.  Psychiatric:        Mood and Affect: Mood normal.        Behavior: Behavior normal.     ED Results / Procedures / Treatments   Labs (all labs ordered are listed, but only abnormal results are displayed) Labs Reviewed - No data to display  EKG None  Radiology No results found.  Procedures Procedures    Medications Ordered in ED Medications  lidocaine (LIDODERM) 5 % 1 patch (1 patch Transdermal Patch Applied 07/24/21 1935)  diazepam (VALIUM) tablet 5 mg (5 mg Oral Given 07/24/21 1936)    ED Course/ Medical Decision Making/ A&P Clinical Course as of 07/24/21 2024  Sun Jul 24, 2021  2021 On reevaluation, patient's lateral rotation when looking to the right improved and her pain has improved with Valium and lidocaine patch.  I will discharge her with Lidoderm patch and Flexeril. [CF]    Clinical Course User Index [CF] Jul 26, 2021, PA-C                           Medical Decision Making Lindsay Bradford is a 61 y.o. female patient who presents to the emergency department today for further  evaluation of right-sided neck pain.  I have a clinical suspicion this is likely torticollis.  She is limited range of motion when looking to the right.  No midline cervical tenderness.  We will give her Valium and lidocaine patch and plan to reassess.   Risk Prescription drug management. Risk Details: As highlighted in ED course, patient feeling better after medications.  I am  going to discharge her home with Flexeril and lidocaine patches.  Patient is on chronic narcotics and I educated her on the risks of taking Flexeril.  I also educated the patient that she should not be mixing any these medications with alcohol or operating heavy machinery.  She expressed full understanding.  She is safe for discharge.   Final Clinical Impression(s) / ED Diagnoses Final diagnoses:  Neck pain    Rx / DC Orders ED Discharge Orders          Ordered    cyclobenzaprine (FLEXERIL) 10 MG tablet  2 times daily PRN        07/24/21 2021    lidocaine (LIDODERM) 5 %  Every 24 hours        07/24/21 2021              Teressa Lower, PA-C 07/24/21 2024    Derwood Kaplan, MD 07/25/21 1525

## 2021-07-25 ENCOUNTER — Telehealth (HOSPITAL_COMMUNITY): Payer: Self-pay | Admitting: Emergency Medicine

## 2021-07-25 MED ORDER — DICLOFENAC SODIUM 1 % EX GEL: 2.0000 g | Freq: Four times a day (QID) | CUTANEOUS | 0 refills | Status: AC

## 2021-08-12 ENCOUNTER — Ambulatory Visit (INDEPENDENT_AMBULATORY_CARE_PROVIDER_SITE_OTHER): Payer: Medicare Other | Admitting: Orthopedic Surgery

## 2021-08-12 DIAGNOSIS — M7062 Trochanteric bursitis, left hip: Secondary | ICD-10-CM

## 2021-08-13 ENCOUNTER — Encounter: Payer: Self-pay | Admitting: Orthopedic Surgery

## 2021-08-13 NOTE — Progress Notes (Signed)
Orthopaedic Clinic Return  Assessment: Lindsay Bradford is a 61 y.o. female with the following: Left greater trochanteric bursitis   Plan: Lindsay Bradford continues to have left lateral hip pain.  She is tired of hurting.  She does not want to continue with injections.  She is interested in pursuing arthroscopic greater trochanteric bursectomy.  Procedure was discussed.  Questions were answered.  We will have to ensure medical clearance, as well as clearance from her cardiologist, and a plan for her anticoagulation.  Of note, she has factor V Leiden.   Follow-up: Return for After medical clearance for OR.   Subjective:  Chief Complaint  Patient presents with   Follow-up    Recheck on left hip.    History of Present Illness: Lindsay Bradford is a 61 y.o. female who returns to clinic for evaluation of left lateral hip pain.  She continues to have pain in the left lateral hip.  She has had this pain for several years.  She has had multiple injections, and they are effective, however the pain returns.  She is still unable to sleep on her left side.  She is interested in surgery as a possible treatment option.   Review of Systems: No fevers or chills No numbness or tingling No chest pain No shortness of breath No bowel or bladder dysfunction No GI distress No headaches   Objective: There were no vitals taken for this visit.  Physical Exam:  Evaluation left hip demonstrates no deformity.  She is tender to palpation directly over the greater trochanter, on the lateral side of the hip.  She tolerates gentle range of motion of the hip.  Pain does not get worse with straight leg raise.  Toes are warm and well-perfused.  IMAGING: I personally ordered and reviewed the following images:  No new imaging obtained today.   Oliver Barre, MD 08/13/2021 10:43 PM

## 2021-08-19 ENCOUNTER — Telehealth: Payer: Self-pay | Admitting: Orthopedic Surgery

## 2021-08-19 ENCOUNTER — Telehealth: Payer: Self-pay

## 2021-08-19 NOTE — Telephone Encounter (Signed)
Patient called back in response to Dr Dallas Schimke' nurse's call, to relay that she has scheduled an appointment with her cardiology office, although was told her cardiologist Dr Wendie Simmer has retired. Patient states she is scheduled with same office, [facility], on 08/31/21, 10:20am, and is to see Dr Berenda Morale: ph#[phone]/ fax#[phone]; aware letter is being sent to this clinic for clearance.

## 2021-08-19 NOTE — Telephone Encounter (Signed)
Pt is a candidate for a left greater trochanteric bursectomy, but needs clearance from cardiology. Letter was sent to cardiologist, but was sent back stating pt needs an appointment before she can be cleared. Called pt and let her know information from cardiology, states she will call and schedule an appointment for clearance.

## 2021-08-23 ENCOUNTER — Other Ambulatory Visit: Payer: Self-pay | Admitting: Family

## 2021-08-25 DIAGNOSIS — M059 Rheumatoid arthritis with rheumatoid factor, unspecified: Secondary | ICD-10-CM | POA: Insufficient documentation

## 2021-08-25 DIAGNOSIS — Z79899 Other long term (current) drug therapy: Secondary | ICD-10-CM | POA: Insufficient documentation

## 2021-09-06 ENCOUNTER — Telehealth: Payer: Medicare Other | Admitting: Orthopedic Surgery

## 2021-09-06 NOTE — Telephone Encounter (Signed)
Patient called and left voicemail at 3:32 and states her surgery is a go now.   Please call her back at [phone]

## 2021-09-09 NOTE — Telephone Encounter (Signed)
Pt was see by Cardiology and Dr Dallas Schimke reviewed provider notes. Per Dr. Eddie Candle: She appears stable from a cardiac perspective. I believe she has a low risk of major adverse cardiac events with her upcoming orthopedic surgery. With that said, I have ordered an echocardiogram to reassess her left ventricular systolic function. Final recommendations will be made once this is completed.   Called pt and let her know that Dr. Eddie Candle is recommending the echocardiogram, and we will need to wait for official clearance before scheduling. Pt states she is scheduled for her echo on 09/16/21. Asked pt to call us once everything is completed, she verbalized understanding.

## 2021-10-03 ENCOUNTER — Telehealth: Payer: Self-pay

## 2021-10-03 NOTE — Telephone Encounter (Signed)
Patient called to let Dr. Amedeo Kinsman know that she had her Echocardiogram on 09/16/21 and would like to go ahead with surgery.  Please give her a call at [phone] to discuss.

## 2021-10-10 ENCOUNTER — Other Ambulatory Visit: Payer: Self-pay | Admitting: Family

## 2021-10-12 NOTE — Telephone Encounter (Signed)
LVM and let pt know we've received everything needed to get her scheduled and we can get her on the schedule for 10/27/21. Asked pt to call back and verify that date before all orders are placed.

## 2021-10-14 ENCOUNTER — Other Ambulatory Visit: Payer: Self-pay

## 2021-10-14 DIAGNOSIS — Z01818 Encounter for other preprocedural examination: Secondary | ICD-10-CM

## 2021-10-14 DIAGNOSIS — M7062 Trochanteric bursitis, left hip: Secondary | ICD-10-CM

## 2021-10-18 ENCOUNTER — Other Ambulatory Visit: Payer: Self-pay | Admitting: Family

## 2021-10-18 DIAGNOSIS — F321 Major depressive disorder, single episode, moderate: Secondary | ICD-10-CM

## 2021-10-18 DIAGNOSIS — F411 Generalized anxiety disorder: Secondary | ICD-10-CM

## 2021-10-19 NOTE — Patient Instructions (Addendum)
Your procedure is scheduled on: 10/27/2021  Report to [facility] at   6:00  AM.  Call this number if you have problems the morning of surgery: [phone]   Remember:   Do not Eat or Drink after midnight         No Smoking the morning of surgery  :  Take these medicines the morning of surgery with A SIP OF WATER: Pantoprazole, Effexor, hydrocodone, and/or zanaflex if needed   Do not wear jewelry, make-up or nail polish.  Do not wear lotions, powders, or perfumes. You may wear deodorant.  Do not shave 48 hours prior to surgery. Men may shave face and neck.  Do not bring valuables to the hospital.  Contacts, dentures or bridgework may not be worn into surgery.  Leave suitcase in the car. After surgery it may be brought to your room.  For patients admitted to the hospital, checkout time is 11:00 AM the day of discharge.   Patients discharged the day of surgery will not be allowed to drive home.    Special Instructions: Shower using CHG night before surgery and shower the day of surgery use CHG.  Use special wash - you have one bottle of CHG for all showers.  You should use approximately 1/2 of the bottle for each shower.  How to Use Chlorhexidine Before Surgery Chlorhexidine gluconate (CHG) is a germ-killing (antiseptic) solution that is used to clean the skin. It can get rid of the bacteria that normally live on the skin and can keep them away for about 24 hours. To clean your skin with CHG, you may be given: A CHG solution to use in the shower or as part of a sponge bath. A prepackaged cloth that contains CHG. Cleaning your skin with CHG may help lower the risk for infection: While you are staying in the intensive care unit of the hospital. If you have a vascular access, such as a central line, to provide short-term or long-term access to your veins. If you have a catheter to drain urine from your bladder. If you are on a ventilator. A ventilator is a machine that  helps you breathe by moving air in and out of your lungs. After surgery. What are the risks? Risks of using CHG include: A skin reaction. Hearing loss, if CHG gets in your ears and you have a perforated eardrum. Eye injury, if CHG gets in your eyes and is not rinsed out. The CHG product catching fire. Make sure that you avoid smoking and flames after applying CHG to your skin. Do not use CHG: If you have a chlorhexidine allergy or have previously reacted to chlorhexidine. On babies younger than 46 months of age. How to use CHG solution Use CHG only as told by your health care provider, and follow the instructions on the label. Use the full amount of CHG as directed. Usually, this is one bottle. During a shower Follow these steps when using CHG solution during a shower (unless your health care provider gives you different instructions): Start the shower. Use your normal soap and shampoo to wash your face and hair. Turn off the shower or move out of the shower stream. Pour the CHG onto a clean washcloth. Do not use any type of brush or rough-edged sponge. Starting at your neck, lather your body down to your toes. Make sure you follow these instructions: If you will be having surgery, pay special attention to the part of your body where  you will be having surgery. Scrub this area for at least 1 minute. Do not use CHG on your head or face. If the solution gets into your ears or eyes, rinse them well with water. Avoid your genital area. Avoid any areas of skin that have broken skin, cuts, or scrapes. Scrub your back and under your arms. Make sure to wash skin folds. Let the lather sit on your skin for 1-2 minutes or as long as told by your health care provider. Thoroughly rinse your entire body in the shower. Make sure that all body creases and crevices are rinsed well. Dry off with a clean towel. Do not put any substances on your body afterward--such as powder, lotion, or perfume--unless you  are told to do so by your health care provider. Only use lotions that are recommended by the manufacturer. Put on clean clothes or pajamas. If it is the night before your surgery, sleep in clean sheets.  During a sponge bath Follow these steps when using CHG solution during a sponge bath (unless your health care provider gives you different instructions): Use your normal soap and shampoo to wash your face and hair. Pour the CHG onto a clean washcloth. Starting at your neck, lather your body down to your toes. Make sure you follow these instructions: If you will be having surgery, pay special attention to the part of your body where you will be having surgery. Scrub this area for at least 1 minute. Do not use CHG on your head or face. If the solution gets into your ears or eyes, rinse them well with water. Avoid your genital area. Avoid any areas of skin that have broken skin, cuts, or scrapes. Scrub your back and under your arms. Make sure to wash skin folds. Let the lather sit on your skin for 1-2 minutes or as long as told by your health care provider. Using a different clean, wet washcloth, thoroughly rinse your entire body. Make sure that all body creases and crevices are rinsed well. Dry off with a clean towel. Do not put any substances on your body afterward--such as powder, lotion, or perfume--unless you are told to do so by your health care provider. Only use lotions that are recommended by the manufacturer. Put on clean clothes or pajamas. If it is the night before your surgery, sleep in clean sheets. How to use CHG prepackaged cloths Only use CHG cloths as told by your health care provider, and follow the instructions on the label. Use the CHG cloth on clean, dry skin. Do not use the CHG cloth on your head or face unless your health care provider tells you to. When washing with the CHG cloth: Avoid your genital area. Avoid any areas of skin that have broken skin, cuts, or  scrapes. Before surgery Follow these steps when using a CHG cloth to clean before surgery (unless your health care provider gives you different instructions): Using the CHG cloth, vigorously scrub the part of your body where you will be having surgery. Scrub using a back-and-forth motion for 3 minutes. The area on your body should be completely wet with CHG when you are done scrubbing. Do not rinse. Discard the cloth and let the area air-dry. Do not put any substances on the area afterward, such as powder, lotion, or perfume. Put on clean clothes or pajamas. If it is the night before your surgery, sleep in clean sheets.  For general bathing Follow these steps when using CHG cloths for general  bathing (unless your health care provider gives you different instructions). Use a separate CHG cloth for each area of your body. Make sure you wash between any folds of skin and between your fingers and toes. Wash your body in the following order, switching to a new cloth after each step: The front of your neck, shoulders, and chest. Both of your arms, under your arms, and your hands. Your stomach and groin area, avoiding the genitals. Your right leg and foot. Your left leg and foot. The back of your neck, your back, and your buttocks. Do not rinse. Discard the cloth and let the area air-dry. Do not put any substances on your body afterward--such as powder, lotion, or perfume--unless you are told to do so by your health care provider. Only use lotions that are recommended by the manufacturer. Put on clean clothes or pajamas. Contact a health care provider if: Your skin gets irritated after scrubbing. You have questions about using your solution or cloth. You swallow any chlorhexidine. Call your local poison control center (1-903-593-1328 in the [place] Get help right away if: Your eyes itch badly, or they become very red or swollen. Your skin itches badly and is red or swollen. Your hearing  changes. You have trouble seeing. You have swelling or tingling in your mouth or throat. You have trouble breathing. These symptoms may represent a serious problem that is an emergency. Do not wait to see if the symptoms will go away. Get medical help right away. Call your local emergency services (911 in the [place] Do not drive yourself to the hospital. Summary Chlorhexidine gluconate (CHG) is a germ-killing (antiseptic) solution that is used to clean the skin. Cleaning your skin with CHG may help to lower your risk for infection. You may be given CHG to use for bathing. It may be in a bottle or in a prepackaged cloth to use on your skin. Carefully follow your health care provider's instructions and the instructions on the product label. Do not use CHG if you have a chlorhexidine allergy. Contact your health care provider if your skin gets irritated after scrubbing. This information is not intended to replace advice given to you by your health care provider. Make sure you discuss any questions you have with your health care provider. Document Revised: 05/02/2021 Document Reviewed: 03/15/2020 Elsevier Patient Education  [place]. Hip Arthroscopy, Care After This sheet gives you information about how to care for yourself after your procedure. Your health care provider may also give you more specific instructions. If you have problems or questions, contact your health care provider. What can I expect after the procedure? After the procedure, it is common to have: Soreness. Swelling. Pain that can be relieved by taking pain medicine. A small amount of fluid from the incisions. Follow these instructions at home: Incision care  Follow instructions from your health care provider about how to take care of your incisions. Make sure you: Wash your hands with soap and water for at least 20 seconds before and after you change your bandage (dressing). If soap and water are not available, use  hand sanitizer. Change your dressing as told by your health care provider. Leave stitches (sutures), skin glue, or adhesive strips in place. These skin closures may need to stay in place for 2 weeks or longer. If adhesive strip edges start to loosen and curl up, you may trim the loose edges. Do not remove adhesive strips completely unless your health care provider tells you to  do that. Check your incision areas every day for signs of infection. Check for: Redness. More swelling or pain. Blood or more fluid. Warmth. Pus or a bad smell. Activity Do not use your hip or leg to support your body weight (bear weight) until your health care provider says that you can. Use crutches or other devices as told by your health care provider. Follow weight-bearing restrictions as told. Return to your normal activities as told by your health care provider. Ask your health care provider what activities are safe for you. Do physical therapy exercises as told by your health care provider. Doing exercises may help to improve hip movement and flexibility (range of motion). Do not lift anything that is heavier than 10 lb (4.5 kg), or the limit that you are told, until your health care provider says that it is safe. Managing pain, stiffness, and swelling  If directed, put ice on the painful area. To do this: Put ice in a plastic bag. Place a towel between your skin and the bag. Leave the ice on for 20 minutes, 2-3 times a day. Remove the ice if your skin turns bright red. This is very important. If you cannot feel pain, heat, or cold, you have a greater risk of damage to the area. Move your toes often to reduce stiffness and swelling. Raise (elevate) your leg above the level of your heart while you are sitting or lying down. Try putting a few pillows under your leg. This will take some of the pressure off your hip. Medicines Take over-the-counter and prescription medicines only as told by your health care  provider. Ask your health care provider if the medicine prescribed to you: Requires you to avoid driving or using machinery. Can cause constipation. You may need to take these actions to prevent or treat constipation: Drink enough fluid to keep your urine pale yellow. Take an over-the-counter or prescription medicines. Eat foods that are high in fiber, such as beans, whole grains, and fresh fruits and vegetables. Limit foods that are high in fat and processed sugars, such as fried or sweet foods. General instructions Do not take baths, swim, or use a hot tub until your health care provider approves. Ask your health care provider if you may take showers. You may only be allowed to take sponge baths. Do not use any products that contain nicotine or tobacco, such as cigarettes, e-cigarettes, or chewing tobacco. These can delay bone healing. If you need help quitting, ask your health care provider. Do not drive or operate machinery until your health care provider says that it is safe. Wear compression stockings as told by your health care provider. These stockings help to prevent blood clots and reduce swelling in your legs. Keep all follow-up visits. This is important. Contact a health care provider if: You have any of these signs of infection: Redness or more pain around an incision. Blood or more fluid coming from an incision. Warmth around an incision. Pus or a bad smell coming from an incision. More swelling on the affected hip. A fever or chills. You have severe pain and medicine does not help. An incision opens up. Get help right away if: You have difficulty breathing. You have shortness of breath. You have chest pain. You develop pain or swelling in your lower leg or at the back of your knee. Your leg becomes numb. These symptoms may represent a serious problem that is an emergency. Do not wait to see if the symptoms will go  away. Get medical help right away. Call your local  emergency services (911 in the [place] Do not drive yourself to the hospital. Summary It is common to have soreness and some pain after this procedure. Take over-the-counter and prescription medicines, including pain medicines, only as told by your health care provider. To help relieve pain and swelling, put ice on your leg for 20 minutes at a time, 2-3 times a day. If physical therapy was prescribed, do exercises as directed. Doing exercises may help to improve hip movement and flexibility. This information is not intended to replace advice given to you by your health care provider. Make sure you discuss any questions you have with your health care provider. Document Revised: 05/13/2019 Document Reviewed: 05/13/2019 Elsevier Patient Education  [employer] Anesthesia, Adult, Care After The following information offers guidance on how to care for yourself after your procedure. Your health care provider may also give you more specific instructions. If you have problems or questions, contact your health care provider. What can I expect after the procedure? After the procedure, it is common for people to: Have pain or discomfort at the IV site. Have nausea or vomiting. Have a sore throat or hoarseness. Have trouble concentrating. Feel cold or chills. Feel weak, sleepy, or tired (fatigue). Have soreness and body aches. These can affect parts of the body that were not involved in surgery. Follow these instructions at home: For the time period you were told by your health care provider:  Rest. Do not participate in activities where you could fall or become injured. Do not drive or use machinery. Do not drink alcohol. Do not take sleeping pills or medicines that cause drowsiness. Do not make important decisions or sign legal documents. Do not take care of children on your own. General instructions Drink enough fluid to keep your urine pale yellow. If you have sleep apnea,  surgery and certain medicines can increase your risk for breathing problems. Follow instructions from your health care provider about wearing your sleep device: Anytime you are sleeping, including during daytime naps. While taking prescription pain medicines, sleeping medicines, or medicines that make you drowsy. Return to your normal activities as told by your health care provider. Ask your health care provider what activities are safe for you. Take over-the-counter and prescription medicines only as told by your health care provider. Do not use any products that contain nicotine or tobacco. These products include cigarettes, chewing tobacco, and vaping devices, such as e-cigarettes. These can delay incision healing after surgery. If you need help quitting, ask your health care provider. Contact a health care provider if: You have nausea or vomiting that does not get better with medicine. You vomit every time you eat or drink. You have pain that does not get better with medicine. You cannot urinate or have bloody urine. You develop a skin rash. You have a fever. Get help right away if: You have trouble breathing. You have chest pain. You vomit blood. These symptoms may be an emergency. Get help right away. Call 911. Do not wait to see if the symptoms will go away. Do not drive yourself to the hospital. Summary After the procedure, it is common to have a sore throat, hoarseness, nausea, vomiting, or to feel weak, sleepy, or fatigue. For the time period you were told by your health care provider, do not drive or use machinery. Get help right away if you have difficulty breathing, have chest pain, or vomit blood. These symptoms  may be an emergency. This information is not intended to replace advice given to you by your health care provider. Make sure you discuss any questions you have with your health care provider. Document Revised: 04/01/2021 Document Reviewed: 04/01/2021 Elsevier Patient  Education  [place].

## 2021-10-24 ENCOUNTER — Encounter (HOSPITAL_COMMUNITY)
Admission: RE | Admit: 2021-10-24 | Discharge: 2021-10-24 | Disposition: A | Discharge status: Home / Self Care | Payer: Medicare Other | Source: Ambulatory Visit | Attending: Orthopedic Surgery | Admitting: Orthopedic Surgery

## 2021-10-24 ENCOUNTER — Encounter (HOSPITAL_COMMUNITY): Payer: Self-pay

## 2021-10-24 ENCOUNTER — Telehealth: Payer: Self-pay | Admitting: Orthopedic Surgery

## 2021-10-24 DIAGNOSIS — Z01812 Encounter for preprocedural laboratory examination: Secondary | ICD-10-CM | POA: Diagnosis present

## 2021-10-24 DIAGNOSIS — M7062 Trochanteric bursitis, left hip: Secondary | ICD-10-CM | POA: Insufficient documentation

## 2021-10-24 DIAGNOSIS — Z01818 Encounter for other preprocedural examination: Secondary | ICD-10-CM

## 2021-10-24 HISTORY — DX: Presence of other cardiac implants and grafts: Z95.818

## 2021-10-24 LAB — CBC WITH DIFFERENTIAL/PLATELET
Abs Immature Granulocytes: 0.01 10*3/uL (ref 0.00–0.07)
Basophils Absolute: 0.1 10*3/uL (ref 0.0–0.1)
Basophils Relative: 2 %
Eosinophils Absolute: 1.1 10*3/uL — ABNORMAL HIGH (ref 0.0–0.5)
Eosinophils Relative: 18 %
HCT: 42.6 % (ref 36.0–46.0)
Hemoglobin: 14 g/dL (ref 12.0–15.0)
Immature Granulocytes: 0 %
Lymphocytes Relative: 43 %
Lymphs Abs: 2.7 10*3/uL (ref 0.7–4.0)
MCH: 31.7 pg (ref 26.0–34.0)
MCHC: 32.9 g/dL (ref 30.0–36.0)
MCV: 96.6 fL (ref 80.0–100.0)
Monocytes Absolute: 0.5 10*3/uL (ref 0.1–1.0)
Monocytes Relative: 9 %
Neutro Abs: 1.7 10*3/uL (ref 1.7–7.7)
Neutrophils Relative %: 28 %
Platelets: 261 10*3/uL (ref 150–400)
RBC: 4.41 MIL/uL (ref 3.87–5.11)
RDW: 14.9 % (ref 11.5–15.5)
WBC: 6.2 10*3/uL (ref 4.0–10.5)
nRBC: 0 % (ref 0.0–0.2)

## 2021-10-24 LAB — BASIC METABOLIC PANEL
Anion gap: 7 (ref 5–15)
BUN: 12 mg/dL (ref 6–20)
CO2: 23 mmol/L (ref 22–32)
Calcium: 8.9 mg/dL (ref 8.9–10.3)
Chloride: 112 mmol/L — ABNORMAL HIGH (ref 98–111)
Creatinine, Ser: 0.86 mg/dL (ref 0.44–1.00)
GFR, Estimated: >60 mL/min (ref >60)
Glucose, Bld: 99 mg/dL (ref 70–99)
Potassium: 3.6 mmol/L (ref 3.5–5.1)
Sodium: 142 mmol/L (ref 135–145)

## 2021-10-24 NOTE — Telephone Encounter (Signed)
Lindsay Bradford from Day surgery called to ask question about taking/stopping medication Xarelto regarding surgery 10/27/21/ Please call back to [facility] at [phone]

## 2021-10-25 NOTE — Telephone Encounter (Signed)
Letter received from Dr. Maisie Fus to d/c Xarelto 2 days prior to surgery. Message sent to nurse with previous mentioned instructions.

## 2021-10-27 ENCOUNTER — Other Ambulatory Visit: Payer: Self-pay

## 2021-10-27 ENCOUNTER — Ambulatory Visit (HOSPITAL_BASED_OUTPATIENT_CLINIC_OR_DEPARTMENT_OTHER): Payer: Medicare Other

## 2021-10-27 ENCOUNTER — Ambulatory Visit (HOSPITAL_COMMUNITY)
Admission: RE | Admit: 2021-10-27 | Discharge: 2021-10-27 | Disposition: A | Discharge status: Home / Self Care | Payer: Medicare Other | Attending: Orthopedic Surgery | Admitting: Orthopedic Surgery

## 2021-10-27 ENCOUNTER — Encounter (HOSPITAL_COMMUNITY)
Admission: RE | Disposition: A | Discharge status: Home / Self Care | Payer: Self-pay | Source: Home / Self Care | Attending: Orthopedic Surgery

## 2021-10-27 ENCOUNTER — Encounter (HOSPITAL_COMMUNITY): Payer: Self-pay | Admitting: Orthopedic Surgery

## 2021-10-27 ENCOUNTER — Ambulatory Visit (HOSPITAL_COMMUNITY): Payer: Medicare Other

## 2021-10-27 ENCOUNTER — Encounter: Payer: Self-pay | Admitting: Orthopedic Surgery

## 2021-10-27 ENCOUNTER — Telehealth: Payer: Self-pay | Admitting: Radiology

## 2021-10-27 DIAGNOSIS — F418 Other specified anxiety disorders: Secondary | ICD-10-CM

## 2021-10-27 DIAGNOSIS — Z87891 Personal history of nicotine dependence: Secondary | ICD-10-CM

## 2021-10-27 DIAGNOSIS — J449 Chronic obstructive pulmonary disease, unspecified: Secondary | ICD-10-CM | POA: Diagnosis not present

## 2021-10-27 DIAGNOSIS — D6851 Activated protein C resistance: Secondary | ICD-10-CM | POA: Diagnosis not present

## 2021-10-27 DIAGNOSIS — M7062 Trochanteric bursitis, left hip: Secondary | ICD-10-CM

## 2021-10-27 SURGERY — RELEASE, ILIOTIBIAL BAND, ARTHROSCOPIC
Anesthesia: General | Site: Hip | Laterality: Left

## 2021-10-27 MED ORDER — EPINEPHRINE PF 1 MG/ML IJ SOLN
INTRAMUSCULAR | Status: AC
  Filled 2021-10-27: qty 8

## 2021-10-27 MED ORDER — BUPIVACAINE-EPINEPHRINE (PF) 0.25% -1:200000 IJ SOLN
INTRAMUSCULAR | Status: AC
  Filled 2021-10-27: qty 30

## 2021-10-27 MED ORDER — VANCOMYCIN HCL IN DEXTROSE 1-5 GM/200ML-% IV SOLN
1000.0000 mg | Freq: Once | INTRAVENOUS | Status: AC
  Administered 2021-10-27: 1000 mg via INTRAVENOUS
  Filled 2021-10-27: qty 200

## 2021-10-27 MED ORDER — SUGAMMADEX SODIUM 200 MG/2ML IV SOLN
INTRAVENOUS | Status: DC | PRN
  Administered 2021-10-27: 200 mg via INTRAVENOUS

## 2021-10-27 MED ORDER — LACTATED RINGERS IV SOLN
INTRAVENOUS | Status: DC
  Administered 2021-10-27: 1000 mL via INTRAVENOUS

## 2021-10-27 MED ORDER — FENTANYL CITRATE (PF) 100 MCG/2ML IJ SOLN
INTRAMUSCULAR | Status: DC | PRN
  Administered 2021-10-27 (×2): 50 ug via INTRAVENOUS

## 2021-10-27 MED ORDER — 0.9 % SODIUM CHLORIDE (POUR BTL) OPTIME
TOPICAL | Status: DC | PRN
  Administered 2021-10-27: 1000 mL

## 2021-10-27 MED ORDER — PROPOFOL 10 MG/ML IV BOLUS
INTRAVENOUS | Status: DC | PRN
  Administered 2021-10-27: 120 ug via INTRAVENOUS

## 2021-10-27 MED ORDER — CHLORHEXIDINE GLUCONATE 0.12 % MT SOLN
15.0000 mL | Freq: Once | OROMUCOSAL | Status: AC
  Administered 2021-10-27: 15 mL via OROMUCOSAL

## 2021-10-27 MED ORDER — ROCURONIUM BROMIDE 100 MG/10ML IV SOLN
INTRAVENOUS | Status: DC | PRN
  Administered 2021-10-27: 50 mg via INTRAVENOUS

## 2021-10-27 MED ORDER — ONDANSETRON HCL 4 MG/2ML IJ SOLN
4.0000 mg | Freq: Once | INTRAMUSCULAR | Status: AC | PRN
  Administered 2021-10-27: 4 mg via INTRAVENOUS
  Filled 2021-10-27: qty 2

## 2021-10-27 MED ORDER — FENTANYL CITRATE (PF) 100 MCG/2ML IJ SOLN
INTRAMUSCULAR | Status: AC
  Filled 2021-10-27: qty 2

## 2021-10-27 MED ORDER — MIDAZOLAM HCL 2 MG/2ML IJ SOLN
INTRAMUSCULAR | Status: AC
  Filled 2021-10-27: qty 2

## 2021-10-27 MED ORDER — DEXAMETHASONE SODIUM PHOSPHATE 10 MG/ML IJ SOLN
INTRAMUSCULAR | Status: DC | PRN
  Administered 2021-10-27: 10 mg via INTRAVENOUS

## 2021-10-27 MED ORDER — PHENYLEPHRINE HCL (PRESSORS) 10 MG/ML IV SOLN
INTRAVENOUS | Status: DC | PRN
  Administered 2021-10-27 (×2): 80 ug via INTRAVENOUS

## 2021-10-27 MED ORDER — HYDROMORPHONE HCL 1 MG/ML IJ SOLN
0.2500 mg | INTRAMUSCULAR | Status: DC | PRN
  Administered 2021-10-27: 0.5 mg via INTRAVENOUS
  Filled 2021-10-27: qty 0.5

## 2021-10-27 MED ORDER — BUPIVACAINE-EPINEPHRINE 0.25% -1:200000 IJ SOLN
INTRAMUSCULAR | Status: DC | PRN
  Administered 2021-10-27: 30 mL

## 2021-10-27 MED ORDER — PROPOFOL 10 MG/ML IV BOLUS
INTRAVENOUS | Status: AC
  Filled 2021-10-27: qty 20

## 2021-10-27 MED ORDER — ORAL CARE MOUTH RINSE: 15.0000 mL | Freq: Once | OROMUCOSAL | Status: AC

## 2021-10-27 MED ORDER — DIPHENHYDRAMINE HCL 50 MG/ML IJ SOLN
INTRAMUSCULAR | Status: DC | PRN
  Administered 2021-10-27: 25 mg via INTRAVENOUS

## 2021-10-27 MED ORDER — ONDANSETRON HCL 4 MG/2ML IJ SOLN
INTRAMUSCULAR | Status: DC | PRN
  Administered 2021-10-27: 4 mg via INTRAVENOUS

## 2021-10-27 MED ORDER — SODIUM CHLORIDE 0.9 % IR SOLN
Status: DC | PRN
  Administered 2021-10-27 (×2): 3000 mL

## 2021-10-27 MED ORDER — LIDOCAINE HCL (CARDIAC) PF 100 MG/5ML IV SOSY
PREFILLED_SYRINGE | INTRAVENOUS | Status: DC | PRN
  Administered 2021-10-27: 80 mg via INTRAVENOUS

## 2021-10-27 SURGICAL SUPPLY — 35 items
APL PRP STRL LF DISP 70% ISPRP (MISCELLANEOUS) ×1
CHLORAPREP W/TINT 26 (MISCELLANEOUS) ×1 IMPLANT
COVER LIGHT HANDLE STERIS (MISCELLANEOUS) ×2 IMPLANT
CUTTER BONE 4.0MM X 13CM (MISCELLANEOUS) ×1 IMPLANT
DRAPE HIP W/POCKET STRL (MISCELLANEOUS) ×1 IMPLANT
GAUZE SPONGE 4X4 12PLY STRL (GAUZE/BANDAGES/DRESSINGS) ×1 IMPLANT
GAUZE XEROFORM 1X8 LF (GAUZE/BANDAGES/DRESSINGS) IMPLANT
GLOVE BIO SURGEON STRL SZ8 (GLOVE) ×2 IMPLANT
GLOVE BIOGEL PI IND STRL 7.0 (GLOVE) ×4 IMPLANT
GLOVE BIOGEL PI IND STRL 8 (GLOVE) ×1 IMPLANT
GOWN STRL REUS W/ TWL XL LVL3 (GOWN DISPOSABLE) ×1 IMPLANT
GOWN STRL REUS W/TWL LRG LVL3 (GOWN DISPOSABLE) ×2 IMPLANT
GOWN STRL REUS W/TWL XL LVL3 (GOWN DISPOSABLE) ×1
IV NS IRRIG 3000ML ARTHROMATIC (IV SOLUTION) ×2 IMPLANT
KIT BLADEGUARD II DBL (SET/KITS/TRAYS/PACK) IMPLANT
MANIFOLD NEPTUNE II (INSTRUMENTS) ×2 IMPLANT
MARKER SKIN DUAL TIP RULER LAB (MISCELLANEOUS) ×1 IMPLANT
NDL HYPO 18GX1.5 BLUNT FILL (NEEDLE) IMPLANT
NDL HYPO 21X1.5 SAFETY (NEEDLE) ×1 IMPLANT
NDL SPNL 18GX3.5 QUINCKE PK (NEEDLE) ×1 IMPLANT
NEEDLE HYPO 18GX1.5 BLUNT FILL (NEEDLE) ×1 IMPLANT
NEEDLE HYPO 21X1.5 SAFETY (NEEDLE) ×1 IMPLANT
NEEDLE SPNL 18GX3.5 QUINCKE PK (NEEDLE) ×1 IMPLANT
NS IRRIG 1000ML POUR BTL (IV SOLUTION) ×1 IMPLANT
PACK TOTAL JOINT (CUSTOM PROCEDURE TRAY) ×1 IMPLANT
PAD ABD 5X9 TENDERSORB (GAUZE/BANDAGES/DRESSINGS) IMPLANT
PORT APPOLLO RF 90DEGREE MULTI (SURGICAL WAND) ×1 IMPLANT
SET BASIN LINEN APH (SET/KITS/TRAYS/PACK) ×1 IMPLANT
SPIKE FLUID TRANSFER (MISCELLANEOUS) IMPLANT
SUT ETHILON 3 0 FSL (SUTURE) ×1 IMPLANT
SYR 10ML LL (SYRINGE) IMPLANT
SYR 30ML LL (SYRINGE) ×1 IMPLANT
TAPE SURG TRANSPORE 1 IN (GAUZE/BANDAGES/DRESSINGS) IMPLANT
TAPE SURGICAL TRANSPORE 1 IN (GAUZE/BANDAGES/DRESSINGS) ×1
YANKAUER SUCT BULB TIP 10FT TU (MISCELLANEOUS) ×1 IMPLANT

## 2021-10-27 NOTE — Telephone Encounter (Signed)
Patient called, had surgery this AM.  Has hydrocodone and it is not helping with pain.  She is asking that something else be sent to [facility] for her.  Please call her.

## 2021-10-27 NOTE — Anesthesia Procedure Notes (Addendum)
Procedure Name: Intubation Date/Time: 10/27/2021 7:41 AM  Performed by: Camillia Herter, RNPre-anesthesia Checklist: Patient identified, Emergency Drugs available, Suction available and Patient being monitored Patient Re-evaluated:Patient Re-evaluated prior to induction Oxygen Delivery Method: Circle system utilized Preoxygenation: Pre-oxygenation with 100% oxygen Induction Type: IV induction Ventilation: Oral airway inserted - appropriate to patient size Laryngoscope Size: Miller and 2 Grade View: Grade I Tube type: Oral Tube size: 7.0 mm Number of attempts: 1 Airway Equipment and Method: Stylet Placement Confirmation: ETT inserted through vocal cords under direct vision, positive ETCO2 and breath sounds checked- equal and bilateral Secured at: 22 cm Tube secured with: Tape Dental Injury: Teeth and Oropharynx as per pre-operative assessment

## 2021-10-27 NOTE — Interval H&P Note (Signed)
History and Physical Interval Note:  10/27/2021 7:19 AM  Lindsay Bradford  has presented today for surgery, with the diagnosis of Left greater trochanteric bursitis.  The various methods of treatment have been discussed with the patient and family. After consideration of risks, benefits and other options for treatment, the patient has consented to  Procedure(s): ARTHROSCOPIC ILIOTIBIAL (IT) BAND RELEASE (Left) as a surgical intervention.  The patient's history has been reviewed, patient examined, no change in status, stable for surgery.  I have reviewed the patient's chart and labs.  Questions were answered to the patient's satisfaction.     Mordecai Rasmussen

## 2021-10-27 NOTE — Transfer of Care (Signed)
Immediate Anesthesia Transfer of Care Note  Patient: Lindsay Bradford  Procedure(s) Performed: ARTHROSCOPIC ILIOTIBIAL (IT) BAND RELEASE (Left: Hip)  Patient Location: PACU  Anesthesia Type:General  Level of Consciousness: awake  Airway & Oxygen Therapy: Patient Spontanous Breathing and Patient connected to nasal cannula oxygen  Post-op Assessment: Report given to RN and Post -op Vital signs reviewed and stable  Post vital signs: Reviewed and stable  Last Vitals:  Vitals Value Taken Time  BP 115/77 10/27/21 0845  Temp 97.7   Pulse 79 10/27/21 0846  Resp 16 10/27/21 0846  SpO2 92 % 10/27/21 0846  Vitals shown include unvalidated device data.  Last Pain:  Vitals:   10/27/21 0650  TempSrc: Oral  PainSc: 8       Patients Stated Pain Goal: 10 (76/16/07 3710)  Complications: No notable events documented.

## 2021-10-27 NOTE — Discharge Instructions (Signed)
Lindsay Bradford [name], MD [facility] [place] [address] [place],  [place]  [address] Phone: [phone] Fax:  [phone]    POST-OPERATIVE INSTRUCTIONS - lateral hip arthroscopy  WOUND CARE - You may remove the Operative Dressing on Post-Op Day #3 (72hrs after surgery).  Alternatively if you would like you can leave dressing on until follow-up if within 7-8 days but keep it dry. - Leave steri-strips in place until they fall off on their own, usually 2 weeks postop. - An ACE wrap may be used to control swelling, do not wrap this too tight.  - There may be a small amount of fluid/bleeding leaking at the surgical site. This is normal;  the lateral hip is filled with fluid during the procedure and can leak for 24-48hrs after surgery. You may change/reinforce the bandage as needed.  - Use Ice as often as possible for the first 7 days, then as needed for pain relief. Always keep a towel, ACE wrap or other barrier between the ice and your skin.  - You may shower on Post-Op Day #3. Gently pat the area dry. Do not soak the knee in water or submerge it. Do not go swimming in the pool or ocean until 4 weeks after surgery or when otherwise instructed.  Keep dry incisions as dry as possible.   BRACE/AMBULATION - No brace is required -            Unless otherwise specified, you will not need a brace after this procedure.    REGIONAL ANESTHESIA (NERVE BLOCKS) - The anesthesia team may have performed a nerve block for you if safe in the setting of your care.  This is a great tool used to minimize pain.  Typically the block may start wearing off overnight.  This can be a challenging period but please utilize your as needed pain medications to try and manage this period and know it will be a brief transition as the nerve block wears completely   POST-OP MEDICATIONS - Multimodal approach to pain control - In general your pain will be controlled with a combination of substances.   Prescriptions unless otherwise discussed are electronically sent to your pharmacy.  This is a carefully made plan we use to minimize narcotic use.     - Hydrocodone - take medication as previously prescribed.  Please contact the clinic if you are having issues with pain control - Xarelto - resume the day after surgery -  Zofran - take as needed for nausea;  if you experience nausea, please contact the clinic   FOLLOW-UP   Please call the office to schedule a follow-up appointment for your incision check, 7-10 days post-operatively.   IF YOU HAVE ANY QUESTIONS, PLEASE FEEL FREE TO CALL OUR OFFICE.   HELPFUL INFORMATION  - If you had a block, it will wear off between 8-24 hrs postop typically.  This is period when your pain may go from nearly zero to the pain you would have had post-op without the block.  This is an abrupt transition but nothing dangerous is happening.  You may take an extra dose of narcotic when this happens.   Keep your leg elevated to decrease swelling, which will then in turn decrease your pain. I would elevate the foot of your bed by putting a couple of couch pillows between your mattress and box spring. I would not keep pillow directly under your ankle.  - Do not sleep with a pillow behind  your knee even if it is more comfortable as this may make it harder to get your knee fully straight long term.   There will be MORE swelling on days 1-3 than there is on the day of surgery.  This also is normal. The swelling will decrease with the anti-inflammatory medication, ice and keeping it elevated. The swelling will make it more difficult to bend your knee. As the swelling goes down your motion will become easier   You may develop swelling and bruising that extends from your knee down to your calf and perhaps even to your foot over the next week. Do not be alarmed. This too is normal, and it is due to gravity   There may be some numbness adjacent to the incision site. This  may last for 6-12 months or longer in some patients and is expected.   You may return to sedentary work/school in the next couple of days when you feel up to it. You will need to keep your leg elevated as much as possible    You should wean off your narcotic medicines as soon as you are able.  Most patients will be off or using minimal narcotics before their first postop appointment.    We suggest you use the pain medication the first night prior to going to bed, in order to ease any pain when the anesthesia wears off. You should avoid taking pain medications on an empty stomach as it will make you nauseous.   Do not drink alcoholic beverages or take illicit drugs when taking pain medications.   It is against the law to drive while taking narcotics. You cannot drive if your Right leg is in brace locked in extension.   Pain medication may make you constipated.  Below are a few solutions to try in this order:  o Decrease the amount of pain medication if you aren't having pain.  o Drink lots of decaffeinated fluids.  o Drink prune juice and/or each dried prunes   o If the first 3 don't work start with additional solutions  o Take Colace - an over-the-counter stool softener  o Take Senokot - an over-the-counter laxative  o Take Miralax - a stronger over-the-counter laxative

## 2021-10-27 NOTE — Anesthesia Preprocedure Evaluation (Addendum)
Anesthesia Evaluation  Patient identified by MRN, date of birth, ID band Patient awake    Reviewed: Allergy & Precautions, NPO status , Patient's Chart, lab work & pertinent test results  Airway Mallampati: II  TM Distance: >3 FB Neck ROM: Full    Dental  (+) Upper Dentures, Lower Dentures   Pulmonary shortness of breath and with exertion, asthma , pneumonia, COPD,  COPD inhaler, former smoker,    Pulmonary exam normal breath sounds clear to auscultation       Cardiovascular + CAD, + Past MI (2010), + Cardiac Stents (2010), + Peripheral Vascular Disease, +CHF (EF 35 - 40%), + DOE and + DVT  Normal cardiovascular exam+ dysrhythmias Ventricular Tachycardia  Rhythm:Regular Rate:Normal  08-Apr-2021 06:22:14 [facility] ROUTINE RECORD [birth year] (66 yr) Female Caucasian Vent. rate 79 BPM PR interval 160 ms QRS duration 94 ms QT/QTcB 388/444 ms P-R-T axes 54 -38 80 Normal sinus rhythm Left axis deviation Low voltage QRS Cannot rule out Anteroseptal infarct , age undetermined Nonspecific T wave abnormality Abnormal ECG When compared with ECG of 25-Mar-2020 17:30, PREVIOUS ECG IS PRESENT No significant change since last tracing Confirmed by Charolette Forward ([id number]) on 04/08/2021 12:34:12 PM   Neuro/Psych  Headaches, PSYCHIATRIC DISORDERS Anxiety Depression  Neuromuscular disease CVA (2010), No Residual Symptoms    GI/Hepatic GERD  Medicated,(+)     substance abuse  ,   Endo/Other  negative endocrine ROS  Renal/GU negative Renal ROS  negative genitourinary   Musculoskeletal  (+) Arthritis , Osteoarthritis and Rheumatoid disorders,  Fibromyalgia -, narcotic dependent  Abdominal   Peds negative pediatric ROS (+)  Hematology  (+) Blood dyscrasia (FACTOR V LEIDEN), ,   Anesthesia Other Findings 09/20/2021 11:56 AM EDT  This result has an attachment that is not available.  Technically difficult study  due to poor patient cooperation. Left Ventricle Left ventricle size is normal. There is sigmoid interventricular septum. EF: 35-40%. Wall motion abnormalities as outlined in graphic representation. Doppler parameters indicate normal diastolic function.  Right Ventricle Right ventricle size is normal. Systolic function is normal.  Left Atrium Left atrium size is normal.  Right Atrium Right atrium size is normal.  IVC/SVC The inferior vena cava demonstrates a diameter of <=2.1 cm and collapses >50%; therefore, the right atrial pressure is estimated at 3 mmHg.  Mitral Valve Mitral valve structure is normal. There is no mitral regurgitation. There is no evidence of mitral valve stenosis.  Tricuspid Valve Tricuspid valve structure is normal. There is trace regurgitation. The right ventricular systolic pressure is normal (<36 mmHg).  Aortic Valve The aortic valve is tricuspid. The leaflets are not thickened and exhibit normal excursion. There is no regurgitation or stenosis.  Pulmonic Valve The pulmonic valve was not well visualized. Trace regurgitation.  Ascending Aorta The aortic root is normal in size. The ascending aorta is normal in size.  Pericardium There is no pericardial effusion.   Reproductive/Obstetrics negative OB ROS                           Anesthesia Physical Anesthesia Plan  ASA: 3  Anesthesia Plan: General   Post-op Pain Management: Dilaudid IV   Induction: Intravenous  PONV Risk Score and Plan: 4 or greater and Ondansetron, Dexamethasone and Midazolam  Airway Management Planned: Oral ETT  Additional Equipment:   Intra-op Plan:   Post-operative Plan: Extubation in OR  Informed Consent: I have reviewed the patients History and Physical, chart,  labs and discussed the procedure including the risks, benefits and alternatives for the proposed anesthesia with the patient or authorized representative who has indicated his/her  understanding and acceptance.     Dental advisory given  Plan Discussed with: CRNA and Surgeon  Anesthesia Plan Comments: (Cardiology clearance note on 08/31/21 was reviewed. PLAN/RECOMMENDATIONS: She appears stable from a cardiac perspective. I believe she has a low risk of major adverse cardiac events with her upcoming orthopedic surgery. With that said, I have ordered an echocardiogram to reassess her left ventricular systolic function. Final recommendations will be made once this is completed. She apparently has not tolerated beta-blocker or ACE inhibitors in the past for treatment of her cardiomyopathy due to hypertension. Indeed, her blood pressure today is only 98/60 mmHg. I suspect this may not be accurate due to the potential underlying vascular disease. This will be explored more at a future visit. For now, she will continue treatment with aspirin 81 mg daily and rosuvastatin for cardiac risk reduction. I have discussed the importance of regular surveillance and aggressive treatment of her cardiac risk factors moving forward. I have also taken the time to review therapeutic goals for her cardiac risk factors which include blood pressure less than 130/80 mmHg, hemoglobin A1c less than 6%, and LDL cholesterol less than 70 mg/dL. She has also been strongly encouraged to stop smoking cigarettes. She has been encouraged to exercise regularly and consume a heart healthy diet. Return in 6 months. Patient advised to return sooner if she develops significant cardiac symptoms. Electronically signed by Abran Duke, MD at 08/31/2021 11:06 PM EDT )       Anesthesia Quick Evaluation

## 2021-10-27 NOTE — Op Note (Signed)
Orthopaedic Surgery Operative Note (CSN: [id number])  Lindsay Bradford  1960/12/29 Date of Surgery: 10/27/2021   Diagnoses:  Left greater trochanteric bursitis  Procedure: Arthroscopic left hip iliotibial band release, and greater trochanteric bursectomy   Operative Finding Successful completion of the planned procedure.  Iliotibial band was released in cruciform fashion.  Underlying bursitis was debrided.  There was no tearing of the short external rotators.      Post-Op Diagnosis: Same Surgeons:Primary: Oliver Barre, MD Assistants: None Location: [facility] OR ROOM 4 Anesthesia: General with local anesthesia Antibiotics: Vancomycin Tourniquet time: N/A Estimated Blood Loss: Minimal Complications: None Specimens: None Implants: * No implants in log *  Indications for Surgery:   Lindsay Bradford is a 61 y.o. female with left greater trochanteric bursitis, that has been refractory to nonoperative management.  She has tried NSAIDs, physical therapy, activity modification and multiple injections.  She has responded well to injections, which is suggestive of bursitis.  Benefits and risks of operative and nonoperative management were discussed prior to surgery with the patient and informed consent form was completed.  Specific risks including infection, need for additional surgery, bleeding, stiffness, recurrence of pain and more severe complications associated with anesthesia were discussed.  All questions were answered.  She elected to proceed.  Surgical consent was finalized.   Procedure:   The patient was identified properly. Informed consent was obtained and the surgical site was marked. The patient was taken to the OR where general anesthesia was induced.  The patient was positioned lateral, with her left side up.  The left leg was prepped and draped in the usual sterile fashion.  Timeout was performed before the beginning of the case.  She received 1 g of vancomycin prior to making incision due  to extensive allergies to antibiotics   The greater trochanter was palpated, and outlined.  We made an incision at the superior aspect of the greater trochanter, then made an additional focal incision approximately 7 cm distal.  The IT band was bluntly swept using a Kelly clamp.  The arthroscope was then inserted in the distal incision.  We were able to identify the underlying IT band.  A blunt probe was then placed within the superior portal, and the IT band was gently swept using this probe.  A shaver was introduced, to debride some of the overlying fat.  Once we had sufficiently cleaned the lateral aspect of the IT band, electrocautery was inserted.  This was used to release the IT band in the longitudinal fashion.  Once we had sufficiently released it extending from the superior portal, to the inferior portal, we then made a cruciform release, extending the release in both the anterior and posterior direction.  The underlying bursa was identified.  It was clearly irritated.  We used the shaver to gently debride the bursa in this area.  We then rotated the hip internally and externally to reveal more of the irritated bursa.  This was gently debrided.  Hemostasis was maintained using electrocautery.  The underlying tendons were closely evaluated.  These were probed.  There were no bare spots on the lateral aspect of the greater trochanter.  No further injuries to the short external rotators.  Once again, we evaluated the greater trochanter very closely, while rotating the hip.  No additional pathology was identified.  Electrocautery was introduced to stop any potential bleeding.  At this point, the arthroscope was removed, as well as all instruments.  We attempted to express any additional fluid from  the lateral aspect of the hip.  The portal incisions were closed with 3-0 nylon.  A dry dressing was placed.  Patient was awoken taken to PACU in stable condition.   Post-operative plan:  The patient will be  weightbearing as tolerated on the left lower extremity.  Recommend the use of crutches.   She will be discharged from the PACU when she has recovered DVT prophylaxis: She will resume Xarelto on postoperative day #1 Pain control with PRN pain medication preferring oral medicines.   Follow up plan will be scheduled in approximately 10-14 days for incision check.

## 2021-10-27 NOTE — H&P (Signed)
  Below is the most recent clinic note for Lindsay Bradford; any pertinent information regarding their recent medical history will be updated on the day of surgery.     Orthopaedic Clinic Return  Assessment: Lindsay Bradford is a 61 y.o. female with the following: Left greater trochanteric bursitis   Plan: Lindsay Bradford continues to have left lateral hip pain.  She is tired of hurting.  She does not want to continue with injections.  She is interested in pursuing arthroscopic greater trochanteric bursectomy.  Procedure was discussed.  Questions were answered.  We will have to ensure medical clearance, as well as clearance from her cardiologist, and a plan for her anticoagulation.  Of note, she has factor V Leiden.   Follow-up: No follow-ups on file.   Subjective:  No chief complaint on file.   History of Present Illness: Lindsay Bradford is a 61 y.o. female who returns to clinic for evaluation of left lateral hip pain.  She continues to have pain in the left lateral hip.  She has had this pain for several years.  She has had multiple injections, and they are effective, however the pain returns.  She is still unable to sleep on her left side.  She is interested in surgery as a possible treatment option.   Review of Systems: No fevers or chills No numbness or tingling No chest pain No shortness of breath No bowel or bladder dysfunction No GI distress No headaches   Objective: BP 131/82   Pulse 74   Temp 98.1 F (36.7 C) (Oral)   Resp 15   Ht 5\' 7"  (1.702 m)   Wt 89.4 kg   SpO2 97%   BMI 30.87 kg/m   Physical Exam:  Evaluation left hip demonstrates no deformity.  She is tender to palpation directly over the greater trochanter, on the lateral side of the hip.  She tolerates gentle range of motion of the hip.  Pain does not get worse with straight leg raise.  Toes are warm and well-perfused.  IMAGING: I personally ordered and reviewed the following images:  No new imaging  obtained today.   Mordecai Rasmussen, MD 10/27/2021 7:17 AM

## 2021-10-27 NOTE — Telephone Encounter (Signed)
Called pt to check status, pt states her pain is about a 20-25 hundred. Let pt know provider recommends doubling current medications and we will do a follow up. Pt states she's already been doing that and it's still not touching her pain. Asked pt who gives her the hydrocodone, states she has a pain doctor at [facility]. Pt also asked about Percocet, and if that would be something better for her to take. Let her know I will send all information to Dr. Amedeo Kinsman and keep her posted.

## 2021-10-27 NOTE — Anesthesia Postprocedure Evaluation (Signed)
Anesthesia Post Note  Patient: Lindsay Bradford  Procedure(s) Performed: ARTHROSCOPIC ILIOTIBIAL (IT) BAND RELEASE (Left: Hip)  Patient location during evaluation: Phase II Anesthesia Type: General Level of consciousness: awake and alert and oriented Pain management: pain level controlled Vital Signs Assessment: post-procedure vital signs reviewed and stable Respiratory status: spontaneous breathing, nonlabored ventilation and respiratory function stable Cardiovascular status: blood pressure returned to baseline and stable Postop Assessment: no apparent nausea or vomiting Anesthetic complications: no   No notable events documented.   Last Vitals:  Vitals:   10/27/21 0915 10/27/21 0925  BP: 99/72 98/64  Pulse: 71 81  Resp: 12 14  Temp:  36.5 C  SpO2: 98% 93%    Last Pain:  Vitals:   10/27/21 0925  TempSrc: Oral  PainSc: 5                  [name] [name]

## 2021-10-28 ENCOUNTER — Other Ambulatory Visit: Payer: Medicare HMO

## 2021-10-28 ENCOUNTER — Telehealth: Payer: Self-pay | Admitting: Radiology

## 2021-10-28 NOTE — Telephone Encounter (Signed)
Patient called and asked when she can remove her bandage.  She does not remember what she was told.  She also says that today her pain is less, not hurting much at all right now.  Please call her about bandage, thanks.

## 2021-10-28 NOTE — Telephone Encounter (Signed)
Called and reviewed pt discharge paperwork with her. Verbalized understanding, no further questions or concerns at this time.

## 2021-11-07 ENCOUNTER — Encounter: Payer: Self-pay | Admitting: Orthopedic Surgery

## 2021-11-07 ENCOUNTER — Ambulatory Visit (INDEPENDENT_AMBULATORY_CARE_PROVIDER_SITE_OTHER): Payer: Medicare Other | Admitting: Orthopedic Surgery

## 2021-11-07 DIAGNOSIS — M7062 Trochanteric bursitis, left hip: Secondary | ICD-10-CM

## 2021-11-07 NOTE — Progress Notes (Signed)
Orthopaedic Postop Note  Assessment: Lindsay Bradford is a 61 y.o. female s/p left hip arthroscopy, iliotibial band release and greater trochanteric bursectomy  DOS: 10/27/2021  Plan: Sutures were removed, Steri-Strips placed. Procedure was reviewed No restrictions Gradual increase in activity. Medications as needed.   Follow-up 4 weeks    Follow-up: Return in about 4 weeks (around 12/05/2021). XR at next visit: None  Subjective:  Chief Complaint  Patient presents with   Post-op Follow-up    Left hip DOS 10/27/21- Arthroscopic left hip iliotibial band release, and greater trochanteric bursectomy- walking better    History of Present Illness: Lindsay Bradford is a 61 y.o. female who presents following the above stated procedure.  Surgery was approximately 10 days ago.  Her pain is better.  She feels as though she is walking better.  No issues with her incisions.  The first 24 hours following surgery were painful, but this has significantly improved.  She has already started to increase the intensity and length of her activities.  Review of Systems: No fevers or chills No numbness or tingling No Chest Pain No shortness of breath   Objective: There were no vitals taken for this visit.  Physical Exam:  Evaluation left hip demonstrates healing lateral hip incisions.  No surrounding erythema or drainage.  Mild tenderness to palpation over the lateral hip.  She tolerates gentle range of motion.  She is walking without assistive device.  Minimal discomfort.  Toes warm and well-perfused.  IMAGING: I personally ordered and reviewed the following images:  No new imaging obtained today.   Mordecai Rasmussen, MD 11/07/2021 10:37 PM

## 2021-12-06 ENCOUNTER — Ambulatory Visit (INDEPENDENT_AMBULATORY_CARE_PROVIDER_SITE_OTHER): Payer: Medicare Other | Admitting: Orthopedic Surgery

## 2021-12-06 ENCOUNTER — Encounter: Payer: Self-pay | Admitting: Orthopedic Surgery

## 2021-12-06 VITALS — Ht 67.0 in | Wt 197.0 lb

## 2021-12-06 DIAGNOSIS — M7062 Trochanteric bursitis, left hip: Secondary | ICD-10-CM

## 2021-12-06 NOTE — Progress Notes (Signed)
Orthopaedic Postop Note  Assessment: Lindsay Bradford is a 62 y.o. female s/p left hip arthroscopy, iliotibial band release and greater trochanteric bursectomy  DOS: 10/27/2021  Plan: Mrs. Lindsay Bradford is doing well.  Her pain is gradually improving.  Incisions look good.  Urged her to continue to increase her level of activity.  She is in agreement.  She is pleased with her progress.  It feels better now than it did prior to surgery.  I will see her back in 6 weeks for repeat evaluation.   Follow-up: Return in about 6 weeks (around 01/17/2022). XR at next visit: None  Subjective:  Chief Complaint  Patient presents with   Routine Post Op    Lt hip DOS 10/27/21    History of Present Illness: Lindsay Bradford is a 61 y.o. female who presents following the above stated procedure.  Surgery was approximately 6 weeks ago.  She has some mild tenderness at the surgical site.  Incisions are healing well.  No pain at the surgical incisions.  She states that she is gradually increasing her walking.  She does have other medical issues, which are restricting her activities, but this is also getting better.   Review of Systems: No fevers or chills No numbness or tingling No Chest Pain No shortness of breath   Objective: Ht 5\' 7"  (1.702 m)   Wt 197 lb (89.4 kg)   BMI 30.85 kg/m   Physical Exam:  Evaluation left hip demonstrates well-healed surgical incisions.  No surrounding erythema or drainage.  Mild tenderness to palpation over the greater trochanter.  She has full range of motion of her left hip.  She is able to maintain a straight leg raise.  She is walking with a nonantalgic gait.  IMAGING: I personally ordered and reviewed the following images:  No new imaging obtained today.   , MD 12/06/2021 11:48 AM

## 2021-12-24 ENCOUNTER — Encounter (HOSPITAL_COMMUNITY): Payer: Self-pay | Admitting: *Deleted

## 2021-12-24 ENCOUNTER — Emergency Department (HOSPITAL_COMMUNITY): Payer: Medicare Other

## 2021-12-24 ENCOUNTER — Other Ambulatory Visit: Payer: Self-pay

## 2021-12-24 ENCOUNTER — Emergency Department (HOSPITAL_COMMUNITY)
Admission: EM | Admit: 2021-12-24 | Discharge: 2021-12-24 | Disposition: A | Discharge status: Home / Self Care | Payer: Medicare Other | Attending: Emergency Medicine | Admitting: Emergency Medicine

## 2021-12-24 DIAGNOSIS — S8992XA Unspecified injury of left lower leg, initial encounter: Secondary | ICD-10-CM | POA: Diagnosis present

## 2021-12-24 DIAGNOSIS — Z23 Encounter for immunization: Secondary | ICD-10-CM | POA: Insufficient documentation

## 2021-12-24 DIAGNOSIS — S81812A Laceration without foreign body, left lower leg, initial encounter: Secondary | ICD-10-CM | POA: Diagnosis not present

## 2021-12-24 DIAGNOSIS — Z7982 Long term (current) use of aspirin: Secondary | ICD-10-CM | POA: Insufficient documentation

## 2021-12-24 DIAGNOSIS — W25XXXA Contact with sharp glass, initial encounter: Secondary | ICD-10-CM | POA: Insufficient documentation

## 2021-12-24 DIAGNOSIS — S80812A Abrasion, left lower leg, initial encounter: Secondary | ICD-10-CM | POA: Diagnosis not present

## 2021-12-24 DIAGNOSIS — Z79899 Other long term (current) drug therapy: Secondary | ICD-10-CM | POA: Insufficient documentation

## 2021-12-24 MED ORDER — TETANUS-DIPHTH-ACELL PERTUSSIS 5-2.5-18.5 LF-MCG/0.5 IM SUSY
0.5000 mL | PREFILLED_SYRINGE | Freq: Once | INTRAMUSCULAR | Status: AC
  Administered 2021-12-24: 0.5 mL via INTRAMUSCULAR
  Filled 2021-12-24: qty 0.5

## 2021-12-24 NOTE — ED Triage Notes (Signed)
Pt with small cuts to left lower leg, states she wanted it checked due to taking a blood thinner.  Pt's grandson jumped on a glass table and table fell, hitting pt's left lower leg and shattered with the table on her leg.

## 2021-12-24 NOTE — ED Provider Notes (Signed)
[facility] EMERGENCY DEPARTMENT Provider Note   CSN: [id number] Arrival date & time: 12/24/21  2118     History  Chief Complaint  Patient presents with   Laceration    Lindsay Bradford is a 61 y.o. female presenting with concern for laceration to the left leg.  She reports that her son was jumping up and down on a glass table that shattered and she sustained a laceration to her she is on blood thinners for her history of factor V Leiden.  Unknown last tetanus.   Laceration      Home Medications Prior to Admission medications   Medication Sig Start Date End Date Taking? Authorizing Provider  albuterol (VENTOLIN HFA) 108 (90 Base) MCG/ACT inhaler Inhale 1-2 puffs into the lungs every 6 (six) hours as needed for wheezing or shortness of breath. 04/26/20   Jannifer Rodney A, FNP  aspirin EC 81 MG EC tablet Take 1 tablet (81 mg total) by mouth daily. Patient taking differently: Take 81 mg by mouth daily. Takes after 9 am daily 12/18/18   Clapacs, Jackquline Denmark, MD  busPIRone (BUSPAR) 7.5 MG tablet TAKE 1 TABLET TWICE DAILY 01/06/21   Jannifer Rodney A, FNP  cyclobenzaprine (FLEXERIL) 10 MG tablet Take 1 tablet (10 mg total) by mouth 2 (two) times daily as needed for muscle spasms. 07/24/21   Honor Loh M, PA-C  diclofenac Sodium (VOLTAREN) 1 % GEL Apply 2 g topically 4 (four) times daily. Patient taking differently: Apply 2 g topically 2 (two) times daily. 07/25/21   Jacalyn Lefevre, MD  diphenoxylate-atropine (LOMOTIL) 2.5-0.025 MG tablet Take 1 tablet by mouth 4 (four) times daily as needed for diarrhea or loose stools. 06/01/21   [provider]  folic acid (FOLVITE) 400 MCG tablet Take 400 mcg by mouth daily.    [provider]  HYDROcodone-acetaminophen (NORCO) 7.5-325 MG tablet Take 1 tablet by mouth 3 (three) times daily as needed for moderate pain. 10/17/21   [provider]  lidocaine 4 % Place 1 patch onto the skin daily as needed (neck pain).    [provider]  methotrexate (RHEUMATREX) 2.5 MG tablet Take 15 mg by mouth every Tuesday. 09/09/21   [provider]  mineral oil-hydrophilic petrolatum (AQUAPHOR) ointment Apply 1 application  topically 3 (three) times daily.    [provider]  nitroGLYCERIN (NITROSTAT) 0.4 MG SL tablet Place 1 tablet (0.4 mg total) under the tongue every 5 (five) minutes as needed for chest pain. 12/17/18   Clapacs, Jackquline Denmark, MD  pantoprazole (PROTONIX) 40 MG tablet Take 1 tablet (40 mg total) by mouth daily. 09/15/20   Jannifer Rodney A, FNP  predniSONE (DELTASONE) 10 MG tablet Take 5 mg by mouth daily.    [provider]  rivaroxaban (XARELTO) 20 MG TABS tablet Take 1 tablet (20 mg total) by mouth daily. 12/18/18   Clapacs, Jackquline Denmark, MD  rosuvastatin (CRESTOR) 40 MG tablet TAKE 1 TABLET EVERY DAY 11/29/20   Jannifer Rodney A, FNP  tiotropium (SPIRIVA) 18 MCG inhalation capsule Place 18 mcg into inhaler and inhale at bedtime.    [provider]  tiZANidine (ZANAFLEX) 4 MG tablet Take 4 mg by mouth every 8 (eight) hours as needed for muscle spasms. 09/05/21   [provider]  topiramate (TOPAMAX) 100 MG tablet TAKE 1 TABLET EVERY DAY 11/29/20   Hawks, Neysa Bonito A, FNP  traZODone (DESYREL) 150 MG tablet USE FROM 1/3 TO 1 TABLET NIGHTLY AS NEEDED FOR SLEEP Patient taking  differently: Take 150 mg by mouth at bedtime as needed for sleep. 10/28/20   [name], FNP  valACYclovir (VALTREX) 500 MG tablet TAKE 1 TABLET EVERY DAY 09/01/20   Jannifer Rodney A, FNP  venlafaxine XR (EFFEXOR-XR) 150 MG 24 hr capsule TAKE 1 CAPSULE EVERY DAY WITH BREAKFAST 11/29/20   Jannifer Rodney A, FNP  Vitamin D, Ergocalciferol, (DRISDOL) 1.25 MG (50000 UNIT) CAPS capsule Take 50,000 Units by mouth every Tuesday. 10/11/21   [provider]      Allergies    Amoxicillin, Cephalexin, Clarithromycin, Ibuprofen, Other, Oxymorphone, Penicillins, Potassium chloride, Sulfa antibiotics, Clindamycin,  Fentanyl, Naproxen, Iodinated contrast media, Lyrica [pregabalin], and Tetracycline    Review of Systems   Review of Systems  Physical Exam Updated Vital Signs BP 101/73 (BP Location: Left Arm)   Pulse 80   Temp 98.2 F (36.8 C) (Oral)   Resp 16   Ht 5\' 7"  (1.702 m)   Wt 89.8 kg   SpO2 94%   BMI 31.01 kg/m  Physical Exam Vitals and nursing note reviewed.  Constitutional:      Appearance: Normal appearance.  HENT:     Head: Normocephalic and atraumatic.  Eyes:     General: No scleral icterus.    Conjunctiva/sclera: Conjunctivae normal.  Pulmonary:     Effort: Pulmonary effort is normal. No respiratory distress.  Musculoskeletal:     Comments: Full range of motion of all joints  Skin:    General: Skin is warm and dry.     Findings: No rash.     Comments: Very superficial abrasion to the anterior left lower extremity.  Normal cap refill distally  Neurological:     Mental Status: She is alert.  Psychiatric:        Mood and Affect: Mood normal.     ED Results / Procedures / Treatments   Labs (all labs ordered are listed, but only abnormal results are displayed) Labs Reviewed - No data to display  EKG None  Radiology DG Tibia/Fibula Left  Result Date: 12/24/2021 CLINICAL DATA:  Lacerations to mid anterior aspect of left tibia and fibula. EXAM: LEFT TIBIA AND FIBULA - 2 VIEW COMPARISON:  None Available. FINDINGS: There is no evidence of fracture or other focal bone lesions. Fixation hardware is noted in the midfoot multiple surgical staples are noted in the soft tissues. No radiopaque foreign body is identified. IMPRESSION: No acute osseous abnormality or radiopaque foreign body. Electronically Signed   By: 14/09/2021 M.D.   On: 12/24/2021 22:52    Procedures Procedures   Medications Ordered in ED Medications  Tdap (BOOSTRIX) injection 0.5 mL (has no administration in time range)    ED Course/ Medical Decision Making/ A&P                           Medical  Decision Making Amount and/or Complexity of Data Reviewed Radiology: ordered.  Risk Prescription drug management.   61 year old female presenting with concern for laceration.  Physical exam reveals a very superficial abrasion.  No signs of foreign body on exam.  X-ray was ordered and there were also no foreign bodies or signs of fracture.  Patient was concerned that she may have broken something because of her osteopenia.  She has full range of motion of all of her joints and does not have any wounds requiring repair.  Tetanus will be updated and patient will be discharged home with wound care.  Final Clinical Impression(s) / ED Diagnoses Final diagnoses:  Abrasion of left lower extremity, initial encounter    Rx / DC Orders ED Discharge Orders     None      Results and diagnoses were explained to the patient. Return precautions discussed in full. Patient had no additional questions and expressed complete understanding.   This chart was dictated using voice recognition software.  Despite best efforts to proofread,  errors can occur which can change the documentation meaning.    Saddie Benders, PA-C 12/24/21 2257    Terrilee Files, MD 12/25/21 1030

## 2021-12-24 NOTE — Discharge Instructions (Addendum)
You came to the emergency department with a concern of a laceration to your right leg.  The x-ray was normal, you did not have any fractures and there is no sign of glass in the wound.  You do not need stitches.  You may continue to take your blood thinner and use Neosporin and a Band-Aid over the area.  With any worsening symptoms please return to the emergency department, otherwise you may follow-up with your PCP.

## 2022-01-08 ENCOUNTER — Emergency Department (HOSPITAL_COMMUNITY)
Admission: EM | Admit: 2022-01-08 | Discharge: 2022-01-08 | Disposition: A | Discharge status: Home / Self Care | Payer: Medicare Other | Attending: Emergency Medicine | Admitting: Emergency Medicine

## 2022-01-08 ENCOUNTER — Other Ambulatory Visit: Payer: Self-pay

## 2022-01-08 ENCOUNTER — Encounter (HOSPITAL_COMMUNITY): Payer: Self-pay | Admitting: Emergency Medicine

## 2022-01-08 ENCOUNTER — Emergency Department (HOSPITAL_COMMUNITY): Payer: Medicare Other

## 2022-01-08 DIAGNOSIS — L539 Erythematous condition, unspecified: Secondary | ICD-10-CM | POA: Diagnosis not present

## 2022-01-08 DIAGNOSIS — B974 Respiratory syncytial virus as the cause of diseases classified elsewhere: Secondary | ICD-10-CM | POA: Diagnosis not present

## 2022-01-08 DIAGNOSIS — R0602 Shortness of breath: Secondary | ICD-10-CM | POA: Diagnosis present

## 2022-01-08 DIAGNOSIS — Z7901 Long term (current) use of anticoagulants: Secondary | ICD-10-CM | POA: Diagnosis not present

## 2022-01-08 DIAGNOSIS — Z1152 Encounter for screening for COVID-19: Secondary | ICD-10-CM | POA: Diagnosis not present

## 2022-01-08 DIAGNOSIS — E876 Hypokalemia: Secondary | ICD-10-CM | POA: Diagnosis not present

## 2022-01-08 DIAGNOSIS — J441 Chronic obstructive pulmonary disease with (acute) exacerbation: Secondary | ICD-10-CM | POA: Diagnosis not present

## 2022-01-08 DIAGNOSIS — R079 Chest pain, unspecified: Secondary | ICD-10-CM

## 2022-01-08 DIAGNOSIS — Z7982 Long term (current) use of aspirin: Secondary | ICD-10-CM | POA: Insufficient documentation

## 2022-01-08 DIAGNOSIS — R000 Tachycardia, unspecified: Secondary | ICD-10-CM | POA: Diagnosis not present

## 2022-01-08 DIAGNOSIS — B338 Other specified viral diseases: Secondary | ICD-10-CM

## 2022-01-08 LAB — RESP PANEL BY RT-PCR (RSV, FLU A&B, COVID)  RVPGX2
Influenza A by PCR: NEGATIVE
Influenza B by PCR: NEGATIVE
Resp Syncytial Virus by PCR: POSITIVE — AB
SARS Coronavirus 2 by RT PCR: NEGATIVE

## 2022-01-08 LAB — BASIC METABOLIC PANEL
Anion gap: 11 (ref 5–15)
BUN: 12 mg/dL (ref 6–20)
CO2: 22 mmol/L (ref 22–32)
Calcium: 8.9 mg/dL (ref 8.9–10.3)
Chloride: 106 mmol/L (ref 98–111)
Creatinine, Ser: 0.8 mg/dL (ref 0.44–1.00)
GFR, Estimated: >60 mL/min (ref >60)
Glucose, Bld: 106 mg/dL — ABNORMAL HIGH (ref 70–99)
Potassium: 3.2 mmol/L — ABNORMAL LOW (ref 3.5–5.1)
Sodium: 139 mmol/L (ref 135–145)

## 2022-01-08 LAB — CBC
HCT: 45.9 % (ref 36.0–46.0)
Hemoglobin: 15 g/dL (ref 12.0–15.0)
MCH: 32.3 pg (ref 26.0–34.0)
MCHC: 32.7 g/dL (ref 30.0–36.0)
MCV: 98.7 fL (ref 80.0–100.0)
Platelets: 272 10*3/uL (ref 150–400)
RBC: 4.65 MIL/uL (ref 3.87–5.11)
RDW: 13.9 % (ref 11.5–15.5)
WBC: 6.5 10*3/uL (ref 4.0–10.5)
nRBC: 0 % (ref 0.0–0.2)

## 2022-01-08 LAB — TROPONIN I (HIGH SENSITIVITY)
Troponin I (High Sensitivity): 6 ng/L (ref <18)
Troponin I (High Sensitivity): 5 ng/L (ref <18)

## 2022-01-08 MED ORDER — SODIUM CHLORIDE 0.9 % IV BOLUS
1000.0000 mL | Freq: Once | INTRAVENOUS | Status: AC
  Administered 2022-01-08: 1000 mL via INTRAVENOUS

## 2022-01-08 MED ORDER — IPRATROPIUM-ALBUTEROL 0.5-2.5 (3) MG/3ML IN SOLN
3.0000 mL | Freq: Once | RESPIRATORY_TRACT | Status: AC
  Administered 2022-01-08: 3 mL via RESPIRATORY_TRACT
  Filled 2022-01-08: qty 3

## 2022-01-08 MED ORDER — ACETAMINOPHEN 500 MG PO TABS
1000.0000 mg | ORAL_TABLET | Freq: Once | ORAL | Status: AC
  Administered 2022-01-08: 1000 mg via ORAL
  Filled 2022-01-08: qty 2

## 2022-01-08 MED ORDER — IPRATROPIUM-ALBUTEROL 0.5-2.5 (3) MG/3ML IN SOLN: 3.0000 mL | RESPIRATORY_TRACT | 0 refills | Status: AC | PRN

## 2022-01-08 NOTE — Discharge Instructions (Addendum)
Thank you for allowing me part of your care today.  You tested positive for RSV which I believe led to an acute exacerbation of your COPD.  I have sent over duoneb inhalation solutions to use with your nebulizer at home.  Please wear a mask around others until you feel well as you are contagious.  You may use the DuoNebs every 4-6 hours as needed for wheezing and chest tightness.  I recommend taking Tylenol as needed for sore throat or bodyaches.  Return to the ED if you develop any worsening symptoms or have any new concerns.

## 2022-01-08 NOTE — ED Triage Notes (Signed)
Pt presents with CP and SOB that started 2 days ago also having cough, chills, exposed to Covid by daughter.

## 2022-01-08 NOTE — ED Provider Notes (Signed)
[facility] EMERGENCY DEPARTMENT Provider Note   CSN: [id number] Arrival date & time: 01/08/22  1120     History  Chief Complaint  Patient presents with   Chest Pain    Sangeeta Youse is a 61 y.o. female presents to the ED with chest pain and shortness of breath that started 2 days ago.  She also reports having cough, chills, sore throat, and was exposed to COVID by her daughter.  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  She reports the cough is productive and her chest feels tight and like someone is sitting on it.  Denies fever, congestion, rhinorrhea, abdominal pain, nausea, vomiting, diarrhea, weakness, lightheadedness, syncope.        Home Medications Prior to Admission medications   Medication Sig Start Date End Date Taking? Authorizing Provider  ipratropium-albuterol (DUONEB) 0.5-2.5 (3) MG/3ML SOLN Take 3 mLs by nebulization every 4 (four) hours as needed. 01/08/22  Yes Cole Klugh R, PA  albuterol (VENTOLIN HFA) 108 (90 Base) MCG/ACT inhaler Inhale 1-2 puffs into the lungs every 6 (six) hours as needed for wheezing or shortness of breath. 04/26/20   Jannifer Rodney A, FNP  aspirin EC 81 MG EC tablet Take 1 tablet (81 mg total) by mouth daily. Patient taking differently: Take 81 mg by mouth daily. Takes after 9 am daily 12/18/18   Clapacs, Jackquline Denmark, MD  busPIRone (BUSPAR) 7.5 MG tablet TAKE 1 TABLET TWICE DAILY 01/06/21   Jannifer Rodney A, FNP  cyclobenzaprine (FLEXERIL) 10 MG tablet Take 1 tablet (10 mg total) by mouth 2 (two) times daily as needed for muscle spasms. 07/24/21   Honor Loh M, PA-C  diclofenac Sodium (VOLTAREN) 1 % GEL Apply 2 g topically 4 (four) times daily. Patient taking differently: Apply 2 g topically 2 (two) times daily. 07/25/21   Jacalyn Lefevre, MD  diphenoxylate-atropine (LOMOTIL) 2.5-0.025 MG tablet Take 1 tablet by mouth 4 (four) times daily as needed for diarrhea or loose stools. 06/01/21   [provider]  folic  acid (FOLVITE) 400 MCG tablet Take 400 mcg by mouth daily.    [provider]  HYDROcodone-acetaminophen (NORCO) 7.5-325 MG tablet Take 1 tablet by mouth 3 (three) times daily as needed for moderate pain. 10/17/21   [provider]  lidocaine 4 % Place 1 patch onto the skin daily as needed (neck pain).    [provider]  methotrexate (RHEUMATREX) 2.5 MG tablet Take 15 mg by mouth every Tuesday. 09/09/21   [provider]  mineral oil-hydrophilic petrolatum (AQUAPHOR) ointment Apply 1 application  topically 3 (three) times daily.    [provider]  nitroGLYCERIN (NITROSTAT) 0.4 MG SL tablet Place 1 tablet (0.4 mg total) under the tongue every 5 (five) minutes as needed for chest pain. 12/17/18   Clapacs, Jackquline Denmark, MD  pantoprazole (PROTONIX) 40 MG tablet Take 1 tablet (40 mg total) by mouth daily. 09/15/20   Jannifer Rodney A, FNP  predniSONE (DELTASONE) 10 MG tablet Take 5 mg by mouth daily.    [provider]  rivaroxaban (XARELTO) 20 MG TABS tablet Take 1 tablet (20 mg total) by mouth daily. 12/18/18   Clapacs, Jackquline Denmark, MD  rosuvastatin (CRESTOR) 40 MG tablet TAKE 1 TABLET EVERY DAY 11/29/20   Jannifer Rodney A, FNP  tiotropium (SPIRIVA) 18 MCG inhalation capsule Place 18 mcg into inhaler and inhale at bedtime.    [provider]  tiZANidine (ZANAFLEX) 4 MG tablet Take 4 mg by mouth every  8 (eight) hours as needed for muscle spasms. 09/05/21   [provider]  topiramate (TOPAMAX) 100 MG tablet TAKE 1 TABLET EVERY DAY 11/29/20   Hawks, Neysa Bonito A, FNP  traZODone (DESYREL) 150 MG tablet USE FROM 1/3 TO 1 TABLET NIGHTLY AS NEEDED FOR SLEEP Patient taking differently: Take 150 mg by mouth at bedtime as needed for sleep. 10/28/20   [name], FNP  valACYclovir (VALTREX) 500 MG tablet TAKE 1 TABLET EVERY DAY 09/01/20   Jannifer Rodney A, FNP  venlafaxine XR (EFFEXOR-XR) 150 MG 24 hr capsule TAKE 1 CAPSULE EVERY DAY WITH BREAKFAST  11/29/20   Jannifer Rodney A, FNP  Vitamin D, Ergocalciferol, (DRISDOL) 1.25 MG (50000 UNIT) CAPS capsule Take 50,000 Units by mouth every Tuesday. 10/11/21   [provider]      Allergies    Amoxicillin, Cephalexin, Clarithromycin, Ibuprofen, Other, Oxymorphone, Penicillins, Potassium chloride, Sulfa antibiotics, Clindamycin, Fentanyl, Naproxen, Iodinated contrast media, Lyrica [pregabalin], and Tetracycline    Review of Systems   Review of Systems  Constitutional:  Positive for chills. Negative for fever.  HENT:  Positive for sore throat. Negative for congestion and rhinorrhea.   Respiratory:  Positive for cough, chest tightness, shortness of breath and wheezing.   Cardiovascular:  Positive for chest pain.  Gastrointestinal:  Negative for abdominal pain, diarrhea, nausea and vomiting.  Neurological:  Negative for syncope, weakness and light-headedness.    Physical Exam Updated Vital Signs BP 101/70   Pulse 79   Temp 98.6 F (37 C) (Oral)   Resp 18   Ht 5\' 7"  (1.702 m)   Wt 83.9 kg   SpO2 96%   BMI 28.98 kg/m  Physical Exam Vitals and nursing note reviewed.  Constitutional:      General: She is not in acute distress.    Appearance: She is not ill-appearing.  HENT:     Nose: No congestion or rhinorrhea.     Mouth/Throat:     Mouth: Mucous membranes are moist.     Pharynx: Oropharynx is clear. Posterior oropharyngeal erythema present. No pharyngeal swelling or oropharyngeal exudate.     Comments: Posterior oropharynx mildly erythematous without exudate or swelling Eyes:     Conjunctiva/sclera: Conjunctivae normal.     Pupils: Pupils are equal, round, and reactive to light.  Cardiovascular:     Rate and Rhythm: Normal rate and regular rhythm.     Pulses: Normal pulses.     Heart sounds: Normal heart sounds.  Pulmonary:     Effort: Pulmonary effort is normal. No tachypnea, accessory muscle usage or respiratory distress.     Breath sounds: Normal air entry.  Wheezing present.     Comments: Diffuse expiratory wheezing in all lung fields Abdominal:     General: Abdomen is flat. Bowel sounds are normal. There is no distension.     Palpations: Abdomen is soft.     Tenderness: There is no abdominal tenderness.  Skin:    General: Skin is warm and dry.     Capillary Refill: Capillary refill takes less than 2 seconds.  Neurological:     Mental Status: She is alert. Mental status is at baseline.  Psychiatric:        Mood and Affect: Mood normal.        Behavior: Behavior normal.     ED Results / Procedures / Treatments   Labs (all labs ordered are listed, but only abnormal results are displayed) Labs Reviewed  RESP PANEL BY RT-PCR (RSV, FLU A&B,  COVID)  RVPGX2 - Abnormal; Notable for the following components:      Result Value   Resp Syncytial Virus by PCR POSITIVE (*)    All other components within normal limits  BASIC METABOLIC PANEL - Abnormal; Notable for the following components:   Potassium 3.2 (*)    Glucose, Bld 106 (*)    All other components within normal limits  CBC  TROPONIN I (HIGH SENSITIVITY)  TROPONIN I (HIGH SENSITIVITY)    EKG EKG Interpretation  Date/Time:  Sunday January 08 2022 11:37:43 EST Ventricular Rate:  108 PR Interval:  172 QRS Duration: 80 QT Interval:  302 QTC Calculation: 404 R Axis:   -79 Text Interpretation: Sinus tachycardia with Premature supraventricular complexes Left axis deviation Low voltage QRS Confirmed by Gloris Manchesterixon, Ryan [phone]([id number]) on 01/08/2022 12:12:06 PM  Radiology DG Chest Portable 1 View  Result Date: 01/08/2022 CLINICAL DATA:  Chest pain EXAM: PORTABLE CHEST 1 VIEW COMPARISON:  Chest x-ray dated March 25, 2020 FINDINGS: Cardiac and mediastinal contours are within normal limits. Calcified granuloma of the right lower lobe. Mild bibasilar atelectasis. Lungs are otherwise clear. No evidence of pneumothorax. IMPRESSION: No active disease. Electronically Signed   By: Allegra LaiLeah  Strickland M.D.   On:  01/08/2022 12:06    Procedures Procedures    Medications Ordered in ED Medications  sodium chloride 0.9 % bolus 1,000 mL (1,000 mLs Intravenous New Bag/Given 01/08/22 1312)  ipratropium-albuterol (DUONEB) 0.5-2.5 (3) MG/3ML nebulizer solution 3 mL (3 mLs Nebulization Given 01/08/22 1436)  acetaminophen (TYLENOL) tablet 1,000 mg (1,000 mg Oral Given 01/08/22 1330)    ED Course/ Medical Decision Making/ A&P                           Medical Decision Making Amount and/or Complexity of Data Reviewed Labs: ordered. Radiology: ordered.  Risk OTC drugs. Prescription drug management.   This patient presents to the ED with chief complaint(s) of shortness of breath, chest pain, cough with pertinent past medical history of COPD with emphysema, CAD, CVA, DVT.  The complaint involves an extensive differential diagnosis and also carries with it a high risk of complications and morbidity.    The differential diagnosis includes influenza, COVID, RSV, pneumonia, acute bronchitis, COPD with acute exacerbation, URI, PE  The initial plan is to do chest pain workup and obtain respiratory swab  Additional history obtained: Additional history obtained from  none Records reviewed  [facility]  Initial Assessment:   On exam, patient is overall well-appearing and is in no acute distress.  Productive cough appreciated on exam.  Diffuse expiratory wheezing on auscultation of all lung fields.  Heart rate is normal with regular rhythm.  SpO2 94% at time of exam.  She is not tachypneic, no accessory muscle use.  Abdomen soft and nontender to palpation.  Skin is warm and dry with normal turgor.  Independent ECG/labs interpretation:  The following labs were independently interpreted:  RSV positive, COVID-negative, influenza negative Initial troponin negative CBC reveals no leukocytosis or anemia. Metabolic panel significant for hypokalemia, no other electrolyte disturbance.  Kidney functions  within normal limits. ECG reveals sinus tachycardia with PACs.  No evidence of ischemia or infarction.  Independent visualization and interpretation of imaging: I independently visualized the following imaging with scope of interpretation limited to determining acute life threatening conditions related to emergency care: Chest x-ray, which revealed no evidence of pneumonia, pneumothorax, there is bibasilar atelectasis.  Treatment and Reassessment: Patient  presents with soft blood pressures, will treat with NS fluid bolus.  Patient states she has positional hypotension.  Due to patient having diffuse expiratory wheezing, will involve respiratory therapy and initiate DuoNeb breathing treatments.  Patient does have oxygen at home to use as needed.  Will also give patient Tylenol for sore throat and bodyaches.  Other treatment options considered:   Potassium chloride was considered for patient's hypokalemia with a potassium of 3.2, however, upon chart review patient has an allergy to potassium chloride oral.  She is able to tolerate slow infusion of potassium IV, may consider giving potassium via this route.  Upon reassessment, auscultation of lung sounds shows improvement.  No audible wheezing on reexamination.  Patient states she is feeling better and would like to go home.  Disposition:   The patient has been appropriately medically screened and/or stabilized in the ED. I have low suspicion for any other emergent medical condition which would require further screening, evaluation or treatment in the ED or require inpatient management. At time of discharge the patient is hemodynamically stable and in no acute distress. I have discussed work-up results and diagnosis with patient and answered all questions. Patient is agreeable with discharge plan. We discussed strict return precautions for returning to the emergency department and they verbalized understanding.  Will send albuterol inhalation solution  prescription to patient's pharmacy as she has run out at home.  Advised patient to use this every 4-6 hours as needed for chest tightness, wheezing, cough.  Patient does have oxygen at home that she can use as needed.  She is in no acute respiratory distress, is stating she subjectively feels better, is 100% SpO2 on room air and I feel that she is appropriate for discharge home.          Final Clinical Impression(s) / ED Diagnoses Final diagnoses:  RSV (respiratory syncytial virus infection)  Nonspecific chest pain  COPD with acute exacerbation (HCC)    Rx / DC Orders ED Discharge Orders          Ordered    ipratropium-albuterol (DUONEB) 0.5-2.5 (3) MG/3ML SOLN  Every 4 hours PRN        01/08/22 1451              Melton Alar [place], [place] 01/08/22 1451    Gloris Manchester, MD 01/09/22 2036

## 2022-01-17 ENCOUNTER — Encounter: Payer: Self-pay | Admitting: Orthopedic Surgery

## 2022-01-17 ENCOUNTER — Ambulatory Visit (INDEPENDENT_AMBULATORY_CARE_PROVIDER_SITE_OTHER): Payer: Medicare Other | Admitting: Orthopedic Surgery

## 2022-01-17 VITALS — Ht 67.0 in | Wt 179.0 lb

## 2022-01-17 DIAGNOSIS — M7062 Trochanteric bursitis, left hip: Secondary | ICD-10-CM

## 2022-01-17 DIAGNOSIS — Z8659 Personal history of other mental and behavioral disorders: Secondary | ICD-10-CM | POA: Insufficient documentation

## 2022-01-17 NOTE — Progress Notes (Signed)
Orthopaedic Postop Note  Assessment: Lindsay Bradford is a 62 y.o. female s/p left hip arthroscopy, iliotibial band release and greater trochanteric bursectomy  DOS: 10/27/2021  Plan: Lindsay Bradford has improved.  She has less pain over the lateral hip.  She does still have some tenderness to palpation, but this is improving.  Her endurance and walking ability is also getting better, although this has been compromised by her recent illness.  Urged her to continue to work on getting stronger, and she states her understanding.  If she has any further issues, she will contact the clinic.  Otherwise, follow-up as needed.   Follow-up: Return if symptoms worsen or fail to improve. 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 female who presents following the above stated procedure.  Surgery was approximately 3 months ago.  She continues to have some tenderness to palpation over the lateral hip.  However, her other pain has improved.  She notes that she had RSV over the holidays, and this has affected her endurance.  Review of Systems: No fevers or chills No numbness or tingling No Chest Pain No shortness of breath   Objective: Ht 5\' 7"  (1.702 m)   Wt 179 lb (81.2 kg)   BMI 28.04 kg/m   Physical Exam:  Evaluation left hip demonstrates well-healed surgical incisions.  No surrounding erythema or drainage.  Mild tenderness to palpation over the greater trochanter.  She has full range of motion of her left hip.  She is able to maintain a straight leg raise.  She is walking with a nonantalgic gait.  She sits comfortably with her left leg crossed over her right.    IMAGING: I personally ordered and reviewed the following images:  No new imaging obtained today.   Mordecai Rasmussen, MD 01/17/2022 1:52 PM

## 2022-03-16 ENCOUNTER — Encounter: Payer: Self-pay | Admitting: Radiology

## 2022-04-07 ENCOUNTER — Ambulatory Visit
Admission: RE | Admit: 2022-04-07 | Discharge: 2022-04-07 | Disposition: A | Discharge status: Home / Self Care | Payer: 59 | Source: Ambulatory Visit | Attending: Internal Medicine | Admitting: Internal Medicine

## 2022-04-07 DIAGNOSIS — M8580 Other specified disorders of bone density and structure, unspecified site: Secondary | ICD-10-CM

## 2022-07-07 ENCOUNTER — Other Ambulatory Visit: Payer: Self-pay | Admitting: Family Medicine

## 2022-09-22 LAB — HM MAMMOGRAPHY

## 2022-10-04 ENCOUNTER — Encounter: Payer: Self-pay | Admitting: Internal Medicine

## 2023-10-04 IMAGING — DX DG HUMERUS 2V *R*
2 series · 2 of 2 positions shown · non-contrast
Comparison: None.

CLINICAL DATA: Right upper arm pain beginning approximately 1 month
ago. Also with left elbow pain and bruising.

EXAM:
RIGHT HUMERUS - 2+ VIEW

[humerus ap]
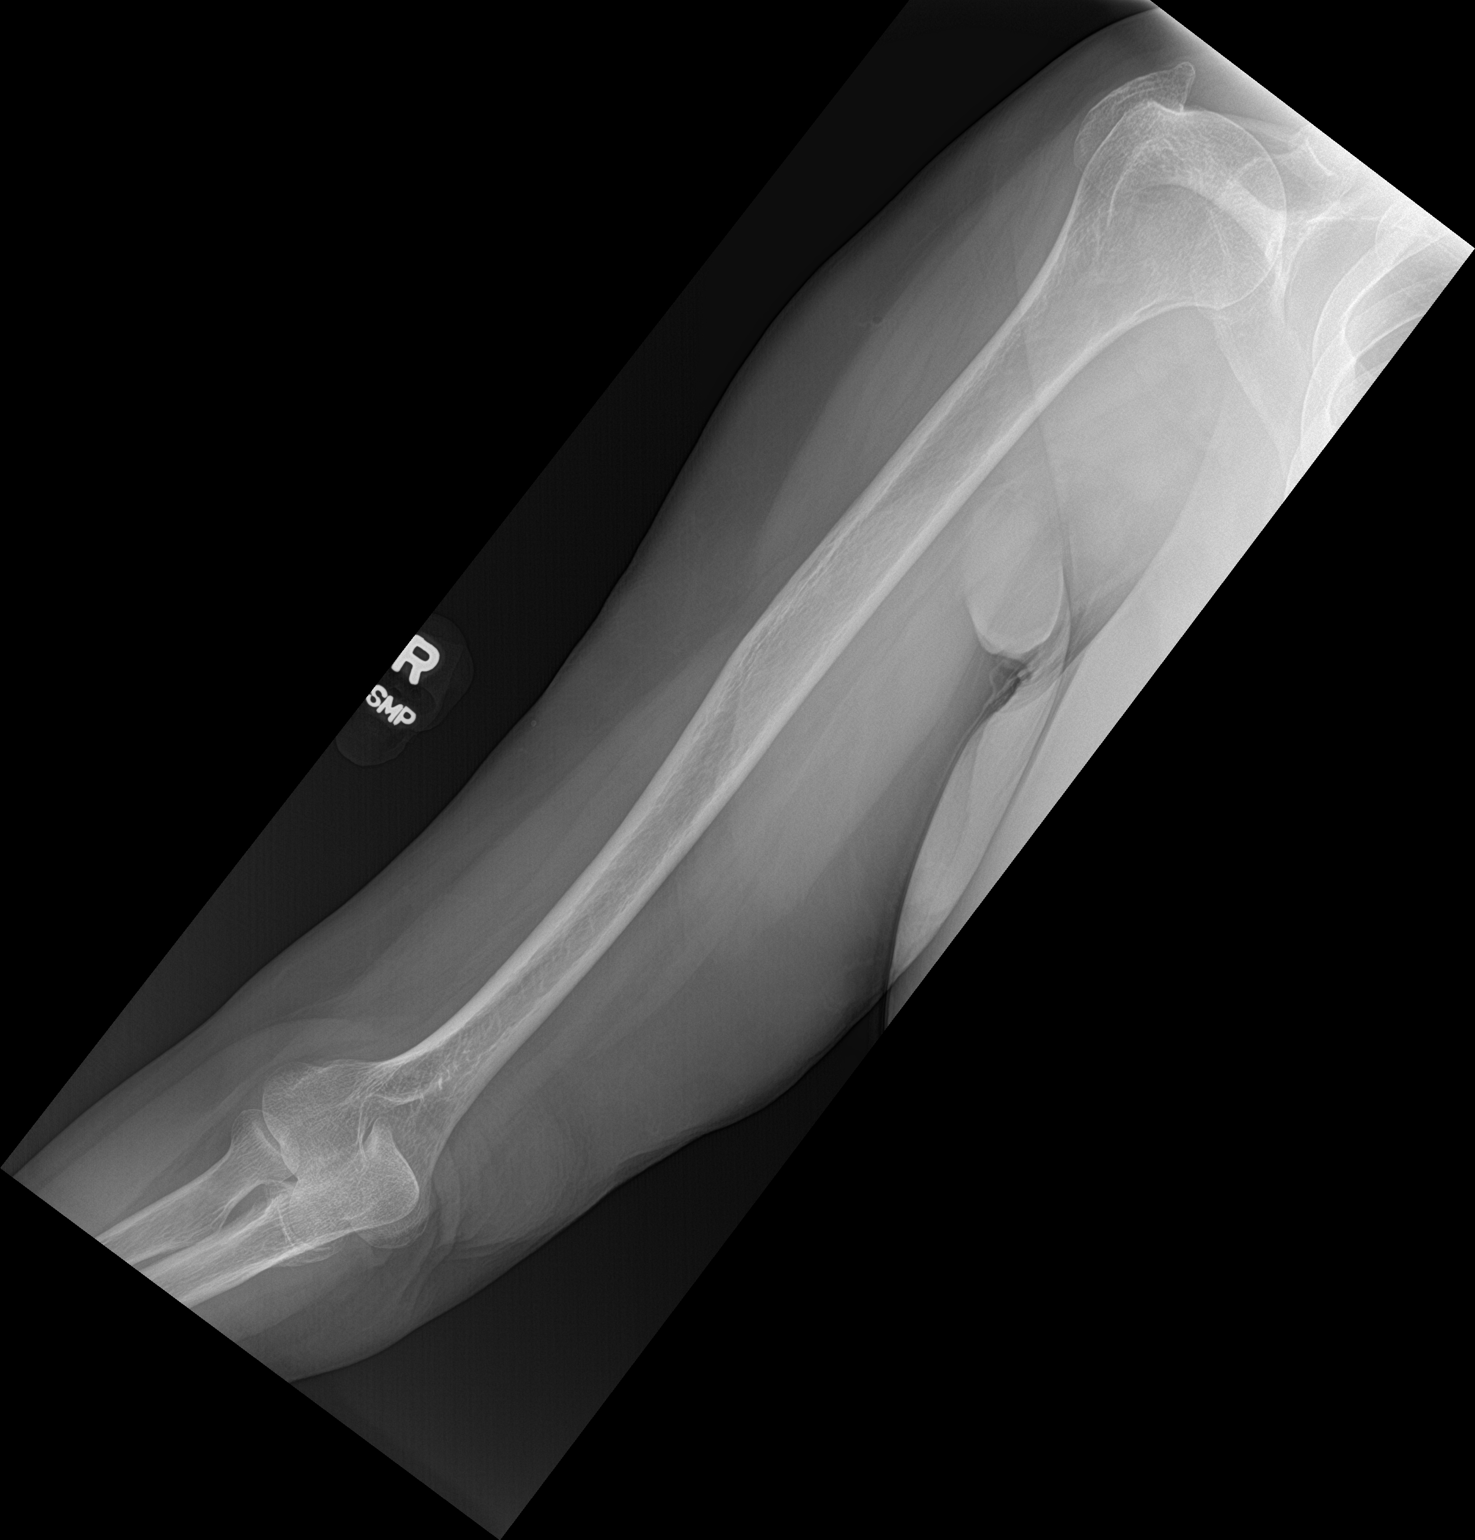

[humerus lat]
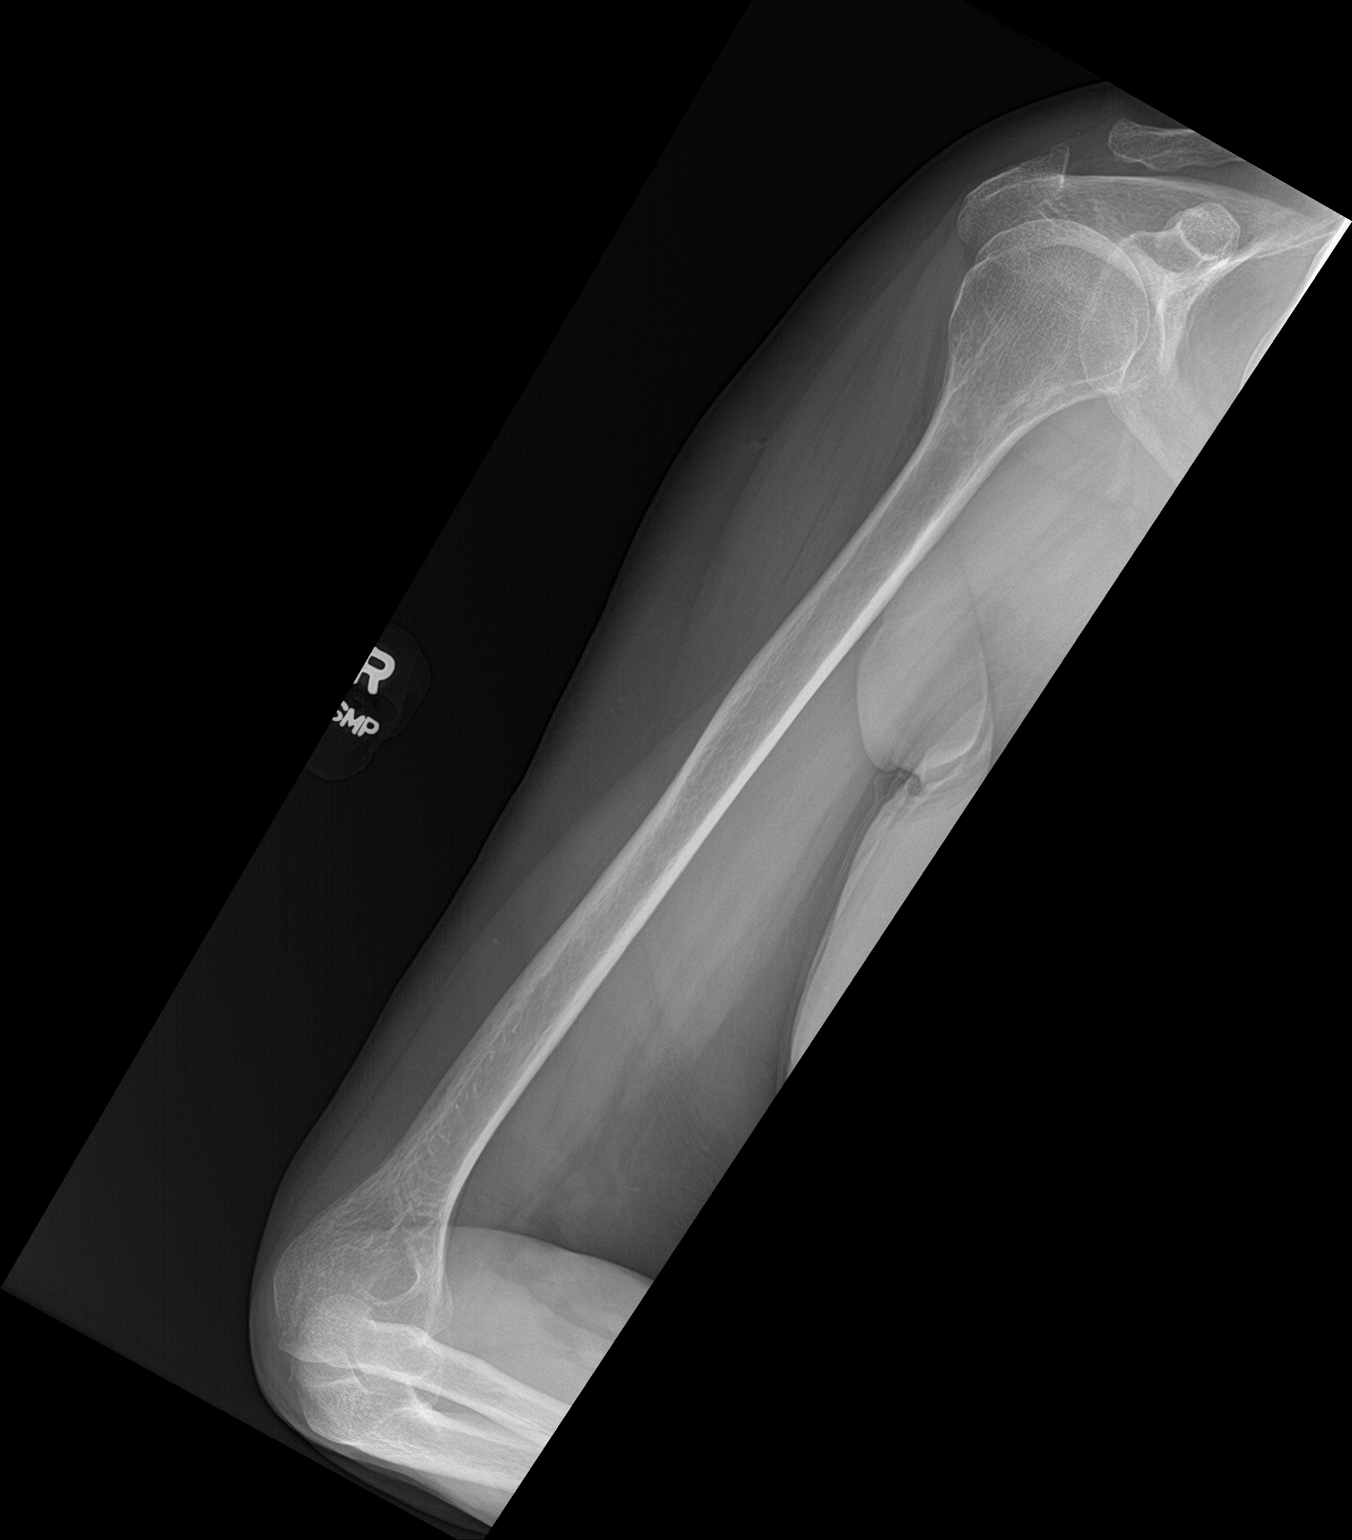

[2 of 2 positions shown; findings below may reference images not displayed]

FINDINGS: No fracture or bone lesion.

Widened AC joint, which may be from old trauma or previous surgery,
stable from a chest radiograph dated 03/25/2020, chronic.

Glenohumeral and elbow joints are normally aligned.

Soft tissues are unremarkable.
IMPRESSION: 1. No fracture or acute finding.  No dislocation.
